# Patient Record
Sex: Female | Born: 1945 | Race: Black or African American | Hispanic: No | Smoking: Former smoker
Health system: Southern US, Community
[De-identification: ages and names within clinical notes are randomized; demographics above are authoritative.]

## PROBLEM LIST (undated history)

## (undated) DIAGNOSIS — M1990 Unspecified osteoarthritis, unspecified site: Secondary | ICD-10-CM

## (undated) DIAGNOSIS — R569 Unspecified convulsions: Secondary | ICD-10-CM

## (undated) DIAGNOSIS — D128 Benign neoplasm of rectum: Secondary | ICD-10-CM

## (undated) DIAGNOSIS — Z87898 Personal history of other specified conditions: Secondary | ICD-10-CM

## (undated) DIAGNOSIS — A09 Infectious gastroenteritis and colitis, unspecified: Secondary | ICD-10-CM

## (undated) DIAGNOSIS — I639 Cerebral infarction, unspecified: Secondary | ICD-10-CM

## (undated) DIAGNOSIS — E559 Vitamin D deficiency, unspecified: Secondary | ICD-10-CM

## (undated) DIAGNOSIS — D129 Benign neoplasm of anus and anal canal: Secondary | ICD-10-CM

## (undated) DIAGNOSIS — I10 Essential (primary) hypertension: Secondary | ICD-10-CM

## (undated) DIAGNOSIS — D849 Immunodeficiency, unspecified: Secondary | ICD-10-CM

## (undated) DIAGNOSIS — M65311 Trigger thumb, right thumb: Secondary | ICD-10-CM

## (undated) DIAGNOSIS — G40209 Localization-related (focal) (partial) symptomatic epilepsy and epileptic syndromes with complex partial seizures, not intractable, without status epilepticus: Secondary | ICD-10-CM

## (undated) DIAGNOSIS — E119 Type 2 diabetes mellitus without complications: Secondary | ICD-10-CM

## (undated) HISTORY — DX: Benign neoplasm of rectum: D12.8

## (undated) HISTORY — DX: Trigger thumb, right thumb: M65.311

## (undated) HISTORY — PX: ABDOMINAL HYSTERECTOMY: SHX81

## (undated) HISTORY — DX: Vitamin D deficiency, unspecified: E55.9

## (undated) HISTORY — PX: BACK SURGERY: SHX140

## (undated) HISTORY — PX: SPINE SURGERY: SHX786

## (undated) HISTORY — PX: OOPHORECTOMY: SHX86

## (undated) HISTORY — DX: Type 2 diabetes mellitus without complications: E11.9

## (undated) HISTORY — DX: Infectious gastroenteritis and colitis, unspecified: A09

## (undated) HISTORY — DX: Benign neoplasm of anus and anal canal: D12.9

## (undated) HISTORY — DX: Personal history of other specified conditions: Z87.898

## (undated) HISTORY — DX: Localization-related (focal) (partial) symptomatic epilepsy and epileptic syndromes with complex partial seizures, not intractable, without status epilepticus: G40.209

---

## 2001-01-09 LAB — HM PAP SMEAR

## 2005-09-14 ENCOUNTER — Emergency Department: Payer: Self-pay | Admitting: Emergency Medicine

## 2005-09-14 ENCOUNTER — Other Ambulatory Visit: Payer: Self-pay

## 2006-09-17 ENCOUNTER — Inpatient Hospital Stay: Payer: Self-pay | Admitting: Internal Medicine

## 2006-09-17 ENCOUNTER — Other Ambulatory Visit: Payer: Self-pay

## 2008-10-20 ENCOUNTER — Ambulatory Visit: Payer: Self-pay

## 2008-11-02 ENCOUNTER — Ambulatory Visit: Payer: Self-pay

## 2009-05-05 ENCOUNTER — Ambulatory Visit: Payer: Self-pay

## 2009-10-26 ENCOUNTER — Ambulatory Visit: Payer: Self-pay

## 2010-11-15 ENCOUNTER — Ambulatory Visit: Payer: Self-pay | Admitting: Family Medicine

## 2011-11-02 ENCOUNTER — Emergency Department: Payer: Self-pay | Admitting: Emergency Medicine

## 2011-11-20 ENCOUNTER — Ambulatory Visit: Payer: Self-pay | Admitting: Family Medicine

## 2012-02-12 LAB — HM COLONOSCOPY

## 2012-11-27 ENCOUNTER — Ambulatory Visit: Payer: Self-pay | Admitting: Pediatrics

## 2013-03-26 ENCOUNTER — Emergency Department: Payer: Self-pay | Admitting: Unknown Physician Specialty

## 2013-03-26 LAB — BASIC METABOLIC PANEL
Chloride: 103 mmol/L (ref 98–107)
Creatinine: 0.91 mg/dL (ref 0.60–1.30)
EGFR (Non-African Amer.): >60
Glucose: 161 mg/dL — ABNORMAL HIGH (ref 65–99)
Potassium: 4 mmol/L (ref 3.5–5.1)
Sodium: 138 mmol/L (ref 136–145)

## 2013-03-26 LAB — CBC
HGB: 13.4 g/dL (ref 12.0–16.0)
MCH: 27.5 pg (ref 26.0–34.0)
MCV: 84 fL (ref 80–100)
Platelet: 300 10*3/uL (ref 150–440)
RBC: 4.87 10*6/uL (ref 3.80–5.20)
RDW: 15.6 % — ABNORMAL HIGH (ref 11.5–14.5)
WBC: 6 10*3/uL (ref 3.6–11.0)

## 2013-12-04 ENCOUNTER — Ambulatory Visit: Payer: Self-pay | Admitting: Pediatrics

## 2014-05-14 LAB — HM DEXA SCAN

## 2015-12-11 ENCOUNTER — Observation Stay
Admission: EM | Admit: 2015-12-11 | Discharge: 2015-12-12 | Disposition: A | Discharge status: Home / Self Care | Payer: Medicare Other | Attending: Internal Medicine | Admitting: Internal Medicine

## 2015-12-11 ENCOUNTER — Emergency Department: Payer: Medicare Other

## 2015-12-11 ENCOUNTER — Encounter: Payer: Self-pay | Admitting: Emergency Medicine

## 2015-12-11 DIAGNOSIS — R112 Nausea with vomiting, unspecified: Secondary | ICD-10-CM | POA: Diagnosis present

## 2015-12-11 DIAGNOSIS — Z8673 Personal history of transient ischemic attack (TIA), and cerebral infarction without residual deficits: Secondary | ICD-10-CM | POA: Diagnosis not present

## 2015-12-11 DIAGNOSIS — Z79899 Other long term (current) drug therapy: Secondary | ICD-10-CM | POA: Diagnosis not present

## 2015-12-11 DIAGNOSIS — G40909 Epilepsy, unspecified, not intractable, without status epilepticus: Secondary | ICD-10-CM | POA: Diagnosis not present

## 2015-12-11 DIAGNOSIS — Z880 Allergy status to penicillin: Secondary | ICD-10-CM | POA: Diagnosis not present

## 2015-12-11 DIAGNOSIS — R42 Dizziness and giddiness: Principal | ICD-10-CM | POA: Insufficient documentation

## 2015-12-11 DIAGNOSIS — E785 Hyperlipidemia, unspecified: Secondary | ICD-10-CM | POA: Insufficient documentation

## 2015-12-11 DIAGNOSIS — I10 Essential (primary) hypertension: Secondary | ICD-10-CM | POA: Diagnosis not present

## 2015-12-11 DIAGNOSIS — Z87891 Personal history of nicotine dependence: Secondary | ICD-10-CM | POA: Insufficient documentation

## 2015-12-11 DIAGNOSIS — Z794 Long term (current) use of insulin: Secondary | ICD-10-CM | POA: Insufficient documentation

## 2015-12-11 DIAGNOSIS — E119 Type 2 diabetes mellitus without complications: Secondary | ICD-10-CM | POA: Diagnosis not present

## 2015-12-11 DIAGNOSIS — R270 Ataxia, unspecified: Secondary | ICD-10-CM | POA: Diagnosis present

## 2015-12-11 HISTORY — DX: Essential (primary) hypertension: I10

## 2015-12-11 HISTORY — DX: Immunodeficiency, unspecified: D84.9

## 2015-12-11 HISTORY — DX: Cerebral infarction, unspecified: I63.9

## 2015-12-11 LAB — VALPROIC ACID LEVEL: Valproic Acid Lvl: 46 ug/mL — ABNORMAL LOW (ref 50.0–100.0)

## 2015-12-11 LAB — BASIC METABOLIC PANEL
Anion gap: 7 (ref 5–15)
BUN: 15 mg/dL (ref 6–20)
CO2: 29 mmol/L (ref 22–32)
CREATININE: 0.65 mg/dL (ref 0.44–1.00)
Calcium: 9.3 mg/dL (ref 8.9–10.3)
Chloride: 102 mmol/L (ref 101–111)
GFR calc Af Amer: >60 mL/min (ref >60)
Glucose, Bld: 269 mg/dL — ABNORMAL HIGH (ref 65–99)
Potassium: 3.7 mmol/L (ref 3.5–5.1)
SODIUM: 138 mmol/L (ref 135–145)

## 2015-12-11 LAB — CBC WITH DIFFERENTIAL/PLATELET
Basophils Absolute: 0 10*3/uL (ref 0–0.1)
Basophils Relative: 0 %
EOS ABS: 0 10*3/uL (ref 0–0.7)
EOS PCT: 0 %
HCT: 40.6 % (ref 35.0–47.0)
Hemoglobin: 13.1 g/dL (ref 12.0–16.0)
LYMPHS ABS: 1.5 10*3/uL (ref 1.0–3.6)
Lymphocytes Relative: 25 %
MCH: 26.2 pg (ref 26.0–34.0)
MCHC: 32.3 g/dL (ref 32.0–36.0)
MCV: 80.9 fL (ref 80.0–100.0)
MONO ABS: 0.4 10*3/uL (ref 0.2–0.9)
MONOS PCT: 7 %
Neutro Abs: 4.2 10*3/uL (ref 1.4–6.5)
Neutrophils Relative %: 68 %
PLATELETS: 256 10*3/uL (ref 150–440)
RBC: 5.02 MIL/uL (ref 3.80–5.20)
RDW: 15.9 % — ABNORMAL HIGH (ref 11.5–14.5)
WBC: 6.1 10*3/uL (ref 3.6–11.0)

## 2015-12-11 LAB — HEMOGLOBIN A1C: Hgb A1c MFr Bld: 11.3 % — ABNORMAL HIGH (ref 4.0–6.0)

## 2015-12-11 LAB — URINALYSIS COMPLETE WITH MICROSCOPIC (ARMC ONLY)
Bacteria, UA: NONE SEEN
Bilirubin Urine: NEGATIVE
Glucose, UA: >500 mg/dL — AB
Hgb urine dipstick: NEGATIVE
Leukocytes, UA: NEGATIVE
Nitrite: NEGATIVE
Protein, ur: NEGATIVE mg/dL
Specific Gravity, Urine: 1.028 (ref 1.005–1.030)
pH: 6 (ref 5.0–8.0)

## 2015-12-11 LAB — GLUCOSE, CAPILLARY
Glucose-Capillary: 296 mg/dL — ABNORMAL HIGH (ref 65–99)
Glucose-Capillary: 312 mg/dL — ABNORMAL HIGH (ref 65–99)
Glucose-Capillary: 153 mg/dL — ABNORMAL HIGH (ref 65–99)

## 2015-12-11 LAB — TROPONIN I

## 2015-12-11 LAB — TSH: TSH: 0.705 u[IU]/mL (ref 0.350–4.500)

## 2015-12-11 MED ORDER — ACETAMINOPHEN 325 MG PO TABS: 650.0000 mg | ORAL_TABLET | Freq: Four times a day (QID) | ORAL | Status: DC | PRN

## 2015-12-11 MED ORDER — INSULIN GLARGINE 100 UNIT/ML ~~LOC~~ SOLN
5.0000 [IU] | Freq: Every day | SUBCUTANEOUS | Status: DC
  Administered 2015-12-11: 5 [IU] via SUBCUTANEOUS
  Filled 2015-12-11 (×2): qty 0.05

## 2015-12-11 MED ORDER — ACETAMINOPHEN 650 MG RE SUPP: 650.0000 mg | Freq: Four times a day (QID) | RECTAL | Status: DC | PRN

## 2015-12-11 MED ORDER — FLUTICASONE PROPIONATE 50 MCG/ACT NA SUSP
2.0000 | Freq: Every day | NASAL | Status: DC
  Administered 2015-12-11 – 2015-12-12 (×2): 2 via NASAL
  Filled 2015-12-11: qty 16

## 2015-12-11 MED ORDER — MORPHINE SULFATE (PF) 2 MG/ML IV SOLN: 2.0000 mg | INTRAVENOUS | Status: DC | PRN

## 2015-12-11 MED ORDER — HYDROCHLOROTHIAZIDE 25 MG PO TABS
25.0000 mg | ORAL_TABLET | Freq: Every day | ORAL | Status: DC
  Administered 2015-12-11 – 2015-12-12 (×2): 25 mg via ORAL
  Filled 2015-12-11 (×2): qty 1

## 2015-12-11 MED ORDER — SODIUM CHLORIDE 0.9 % IJ SOLN
3.0000 mL | Freq: Two times a day (BID) | INTRAMUSCULAR | Status: DC
  Administered 2015-12-11 – 2015-12-12 (×3): 3 mL via INTRAVENOUS

## 2015-12-11 MED ORDER — DIVALPROEX SODIUM 250 MG PO DR TAB
250.0000 mg | DELAYED_RELEASE_TABLET | Freq: Two times a day (BID) | ORAL | Status: DC
  Administered 2015-12-11 – 2015-12-12 (×3): 250 mg via ORAL
  Filled 2015-12-11 (×4): qty 1

## 2015-12-11 MED ORDER — DOCUSATE SODIUM 100 MG PO CAPS
100.0000 mg | ORAL_CAPSULE | Freq: Two times a day (BID) | ORAL | Status: DC
  Administered 2015-12-11: 100 mg via ORAL
  Filled 2015-12-11 (×2): qty 1

## 2015-12-11 MED ORDER — ONDANSETRON HCL 4 MG/2ML IJ SOLN: 4.0000 mg | Freq: Four times a day (QID) | INTRAMUSCULAR | Status: DC | PRN

## 2015-12-11 MED ORDER — HEPARIN SODIUM (PORCINE) 5000 UNIT/ML IJ SOLN
5000.0000 [IU] | Freq: Three times a day (TID) | INTRAMUSCULAR | Status: DC
  Administered 2015-12-11 – 2015-12-12 (×3): 5000 [IU] via SUBCUTANEOUS
  Filled 2015-12-11 (×3): qty 1

## 2015-12-11 MED ORDER — INSULIN ASPART 100 UNIT/ML ~~LOC~~ SOLN
0.0000 [IU] | Freq: Three times a day (TID) | SUBCUTANEOUS | Status: DC
  Administered 2015-12-11: 11 [IU] via SUBCUTANEOUS
  Administered 2015-12-11: 8 [IU] via SUBCUTANEOUS
  Administered 2015-12-12: 09:00:00 3 [IU] via SUBCUTANEOUS
  Filled 2015-12-11: qty 3
  Filled 2015-12-11: qty 8
  Filled 2015-12-11: qty 11

## 2015-12-11 MED ORDER — SODIUM CHLORIDE 0.9 % IV SOLN
INTRAVENOUS | Status: DC
  Administered 2015-12-11 (×2): via INTRAVENOUS

## 2015-12-11 MED ORDER — MECLIZINE HCL 25 MG PO TABS
25.0000 mg | ORAL_TABLET | Freq: Once | ORAL | Status: AC
  Administered 2015-12-11: 25 mg via ORAL
  Filled 2015-12-11: qty 1

## 2015-12-11 MED ORDER — ATORVASTATIN CALCIUM 20 MG PO TABS
40.0000 mg | ORAL_TABLET | ORAL | Status: DC
  Administered 2015-12-12: 09:00:00 40 mg via ORAL
  Filled 2015-12-11: qty 2

## 2015-12-11 MED ORDER — ONDANSETRON HCL 4 MG PO TABS: 4.0000 mg | ORAL_TABLET | Freq: Four times a day (QID) | ORAL | Status: DC | PRN

## 2015-12-11 MED ORDER — QUINAPRIL HCL 10 MG PO TABS
20.0000 mg | ORAL_TABLET | Freq: Every day | ORAL | Status: DC
  Administered 2015-12-11 – 2015-12-12 (×2): 20 mg via ORAL
  Filled 2015-12-11 (×2): qty 2

## 2015-12-11 MED ORDER — MECLIZINE HCL 25 MG PO TABS: 25.0000 mg | ORAL_TABLET | Freq: Three times a day (TID) | ORAL | Status: DC | PRN

## 2015-12-11 MED ORDER — PREDNISONE 5 MG PO TABS
10.0000 mg | ORAL_TABLET | Freq: Every day | ORAL | Status: DC
Start: 2015-12-11 — End: 2015-12-12
  Administered 2015-12-12: 09:00:00 10 mg via ORAL
  Filled 2015-12-11: qty 2

## 2015-12-11 MED ORDER — LAMOTRIGINE 100 MG PO TABS
100.0000 mg | ORAL_TABLET | Freq: Two times a day (BID) | ORAL | Status: DC
  Administered 2015-12-11 – 2015-12-12 (×3): 100 mg via ORAL
  Filled 2015-12-11 (×5): qty 1

## 2015-12-11 NOTE — ED Notes (Addendum)
Pt states she woke up around 0700 this am with dizziness and n,v, states she felt fine when she went to bed last night, equal grip strength, no drift noted, face symmetrical

## 2015-12-11 NOTE — ED Notes (Signed)
Patient states that she is experiencing dizziness that started about 7:30am, dizziness is worse with movement. Patient is also experiencing nausea and vomiting.

## 2015-12-11 NOTE — Progress Notes (Signed)
Assumed pt at 1540-1900. Pt denies pain. Ambulates to the bathroom with 1xassist. Denies dizziness.

## 2015-12-11 NOTE — Care Management Obs Status (Signed)
[facility] NOTIFICATION   Patient Details  Name: LOYS CWIKLA MRN: [id number] Date of Birth: 1946-11-23   Medicare Observation Status Notification Given:  Yes    Ival Bible, RN 12/11/2015, 11:37 AM

## 2015-12-11 NOTE — Plan of Care (Addendum)
Problem: Fluid Volume: Goal: Ability to maintain a balanced intake and output will improve Outcome: Progressing IVF infusing. No syncopal episodes thus far.  Patient calls for assistance.  Meclizine PRN ordered. No needs at this time.

## 2015-12-11 NOTE — ED Provider Notes (Signed)
[facility] Emergency Department Provider Note  ____________________________________________  Time seen: Approximately 9:25 AM  I have reviewed the triage vital signs and the nursing notes.   HISTORY  Chief Complaint Dizziness    HPI Brittany Sherman is a 69 y.o. female with a history of hypertension and an intracranial hemorrhage who is presenting today with dizziness upon waking. She says that she woke up about 7 AM but felt normal when she went to sleep last night at about 11 or 11:30. She denies any pain. Says that she feels like she is unsteady when she walks and leaning to the left. She says the dizziness gets worse with movement. Denies any roaring or pressure in her ears bilaterally or recent illness. Says that she is not supposed to use any aspirin or blood thinners secondary to her bleed in the past.Says that also had 2 episodes of vomiting after becoming very dizzy and needing assistance walking back to her bed from the bathroom this morning. Denies any nausea right now is only minimally dizzy. Denies any feelings that she will pass out or chest pain.   Past Medical History  Diagnosis Date  . Hypertension   . Immune deficiency disorder ([place])   . Stroke [facility])     There are no active problems to display for this patient.   History reviewed. No pertinent past surgical history.  No current outpatient prescriptions on file.  Allergies Penicillins  No family history on file.  Social History Social History  Substance Use Topics  . Smoking status: Former Research scientist (life sciences)  . Smokeless tobacco: None  . Alcohol Use: No    Review of Systems Constitutional: No fever/chills Eyes: No visual changes. ENT: No sore throat. Cardiovascular: Denies chest pain. Respiratory: Denies shortness of breath. Gastrointestinal: No abdominal pain.  No diarrhea.  No constipation. Genitourinary: Negative for dysuria. Musculoskeletal: Negative for back pain. Skin: Negative  for rash. Neurological: Negative for headaches, focal weakness or numbness.  10-point ROS otherwise negative.  ____________________________________________   PHYSICAL EXAM:  VITAL SIGNS: ED Triage Vitals  Enc Vitals Group     BP 12/11/15 0913 164/76 mmHg     Pulse Rate 12/11/15 0913 59     Resp 12/11/15 0913 18     Temp --      Temp src --      SpO2 12/11/15 0913 95 %     Weight 12/11/15 0913 172 lb (78.019 kg)     Height 12/11/15 0913 5\' 4"  (1.626 m)     Head Cir --      Peak Flow --      Pain Score --      Pain Loc --      Pain Edu? --      Excl. in [place]? --     Constitutional: Alert and oriented. Well appearing and in no acute distress. Eyes: Conjunctivae are normal. PERRL. EOMI. no nystagmus. Head: Atraumatic. TMs normal bilaterally. Nose: No congestion/rhinnorhea. Mouth/Throat: Mucous membranes are moist.  Oropharynx non-erythematous. Neck: No stridor.   Cardiovascular: Normal rate, regular rhythm. Grossly normal heart sounds.  Good peripheral circulation. Respiratory: Normal respiratory effort.  No retractions. Lungs CTAB. Gastrointestinal: Soft and nontender. No distention.  No CVA tenderness. Musculoskeletal: No lower extremity tenderness nor edema.  No joint effusions. Neurologic:  Normal speech and language. No gross focal neurologic deficits are appreciated. No ataxia on heel-to-shin testing or finger-nose testing bilaterally.  Skin:  Skin is warm, dry and intact. No rash noted. Psychiatric:  Mood and affect are normal. Speech and behavior are normal.  NIH Stroke Scale   Person Administering Scale: Doran Stabler  Administer stroke scale items in the order listed. Record performance in each category after each subscale exam. Do not go back and change scores. Follow directions provided for each exam technique. Scores should reflect what the patient does, not what the clinician thinks the patient can do. The clinician should record answers while  administering the exam and work quickly. Except where indicated, the patient should not be coached (i.e., repeated requests to patient to make a special effort).   1a  Level of consciousness: 0=alert; keenly responsive  1b. LOC questions:  0=Performs both tasks correctly  1c. LOC commands: 0=Performs both tasks correctly  2.  Best Gaze: 0=normal  3.  Visual: 0=No visual loss  4. Facial Palsy: 0=Normal symmetric movement  5a.  Motor left arm: 0=No drift, limb holds 90 (or 45) degrees for full 10 seconds  5b.  Motor right arm: 0=No drift, limb holds 90 (or 45) degrees for full 10 seconds  6a. motor left leg: 0=No drift, limb holds 90 (or 45) degrees for full 10 seconds  6b  Motor right leg:  0=No drift, limb holds 90 (or 45) degrees for full 10 seconds  7. Limb Ataxia: 0=Absent  8.  Sensory: 0=Normal; no sensory loss  9. Best Language:  0=No aphasia, normal  10. Dysarthria: 0=Normal  11. Extinction and Inattention: 0=No abnormality  12. Distal motor function: 0=Normal   Total:   0   ____________________________________________   LABS (all labs ordered are listed, but only abnormal results are displayed)  Labs Reviewed  CBC WITH DIFFERENTIAL/PLATELET - Abnormal; Notable for the following:    RDW 15.9 (*)    All other components within normal limits  BASIC METABOLIC PANEL - Abnormal; Notable for the following:    Glucose, Bld 269 (*)    All other components within normal limits  VALPROIC ACID LEVEL - Abnormal; Notable for the following:    Valproic Acid Lvl 46 (*)    All other components within normal limits  TROPONIN I  URINALYSIS COMPLETEWITH MICROSCOPIC ([facility] ONLY)   ____________________________________________  EKG  ED ECG REPORT I, Doran Stabler, the attending physician, personally viewed and interpreted this ECG.   Date: 12/11/2015  EKG Time: 918  Rate:61  Rhythm: normal sinus rhythm  Axis: Normal axis  Intervals:Incomplete left bundle with minimal voltage  criteria for LVH.  ST&T Change: T-wave inversion in aVL. Similar appearance to T-wave inversion from EKG of 09/17/2006.  ____________________________________________  RADIOLOGY  No acute finding on the CAT scan of the brain. ____________________________________________   PROCEDURES  ____________________________________________   INITIAL IMPRESSION / ASSESSMENT AND PLAN / ED COURSE  Pertinent labs & imaging results that were available during my care of the patient were reviewed by me and considered in my medical decision making (see chart for details).  ----------------------------------------- 10:49 AM on 12/11/2015 -----------------------------------------  Patient with minimal improvement after meclizine. I got the patient up to walk and she is very unsteady on her feet and I had to catch her one point so she would not fall. She says she still feels very dizzy when walking. Admit to the hospital for further workup. A splint is to the patient as well as her daughter was with her at the bedside. Signed out to Dr. Marcille Blanco. Holding aspirin secondary to the patient's previous bleed and bleeding precautions. No further nausea and vomiting  in the emergency department. ____________________________________________   FINAL CLINICAL IMPRESSION(S) / ED DIAGNOSES  Ataxia and vertigo. Nausea and vomiting.    Orbie Pyo, MD 12/11/15 1050

## 2015-12-11 NOTE — H&P (Addendum)
Brittany Sherman is an 69 y.o. female.   Chief Complaint: Dizziness HPI: The patient presents to the emergency department complaining of dizziness. She states that began this morning when she awoke. She has been feeling fine all week. She denies sick contacts. Also denies fever, nausea or vomiting. She states the only time she has felt this way before was when she has had an ear infection. She denies pain in her years. When gait was tested in the emergency department the patient was very steady on her feet which prompted the emergency department staff to call for admission.  Past Medical History  Diagnosis Date  . Hypertension   . Immune deficiency disorder ([place])   . Stroke [facility])     History reviewed. No pertinent past surgical history.  No family history on file. Social History:  reports that she has quit smoking. She does not have any smokeless tobacco history on file. She reports that she does not drink alcohol. Her drug history is not on file.  Allergies:  Allergies  Allergen Reactions  . Aspirin     Stroke  . Penicillins Rash    Has patient had a PCN reaction causing immediate rash, facial/tongue/throat swelling, SOB or lightheadedness with hypotension: Yes Has patient had a PCN reaction causing severe rash involving mucus membranes or skin necrosis: No Has patient had a PCN reaction that required hospitalization No Has patient had a PCN reaction occurring within the last 10 years: Yes If all of the above answers are "NO", then may proceed with Cephalosporin use.    Medications Prior to Admission  Medication Sig Dispense Refill  . atorvastatin (LIPITOR) 40 MG tablet Take 40 mg by mouth every morning.    . divalproex (DEPAKOTE) 250 MG DR tablet Take 250 mg by mouth 2 (two) times daily.    Marland Kitchen glimepiride (AMARYL) 4 MG tablet Take 4 mg by mouth 2 (two) times daily.    . hydrochlorothiazide (HYDRODIURIL) 25 MG tablet Take 25 mg by mouth daily.    . insulin NPH-regular Human (NOVOLIN  70/30) (70-30) 100 UNIT/ML injection Inject 10 Units into the skin 2 (two) times daily with a meal.    . lamoTRIgine (LAMICTAL) 100 MG tablet Take 100 mg by mouth 2 (two) times daily.    . quinapril (ACCUPRIL) 20 MG tablet Take 20 mg by mouth daily.      Results for orders placed or performed during the hospital encounter of 12/11/15 (from the past 48 hour(s))  CBC with Differential     Status: Abnormal   Collection Time: 12/11/15  9:38 AM  Result Value Ref Range   WBC 6.1 3.6 - 11.0 K/uL   RBC 5.02 3.80 - 5.20 MIL/uL   Hemoglobin 13.1 12.0 - 16.0 g/dL   HCT 40.6 35.0 - 47.0 %   MCV 80.9 80.0 - 100.0 fL   MCH 26.2 26.0 - 34.0 pg   MCHC 32.3 32.0 - 36.0 g/dL   RDW 15.9 (H) 11.5 - 14.5 %   Platelets 256 150 - 440 K/uL   Neutrophils Relative % 68 %   Neutro Abs 4.2 1.4 - 6.5 K/uL   Lymphocytes Relative 25 %   Lymphs Abs 1.5 1.0 - 3.6 K/uL   Monocytes Relative 7 %   Monocytes Absolute 0.4 0.2 - 0.9 K/uL   Eosinophils Relative 0 %   Eosinophils Absolute 0.0 0 - 0.7 K/uL   Basophils Relative 0 %   Basophils Absolute 0.0 0 - 0.1 K/uL  Basic  metabolic panel     Status: Abnormal   Collection Time: 12/11/15  9:38 AM  Result Value Ref Range   Sodium 138 135 - 145 mmol/L   Potassium 3.7 3.5 - 5.1 mmol/L   Chloride 102 101 - 111 mmol/L   CO2 29 22 - 32 mmol/L   Glucose, Bld 269 (H) 65 - 99 mg/dL   BUN 15 6 - 20 mg/dL   Creatinine, Ser 0.65 0.44 - 1.00 mg/dL   Calcium 9.3 8.9 - 10.3 mg/dL   GFR calc non Af Amer >60 >60 mL/min   GFR calc Af Amer >60 >60 mL/min    Comment: (NOTE) The eGFR has been calculated using the CKD EPI equation. This calculation has not been validated in all clinical situations. eGFR's persistently <60 mL/min signify possible Chronic Kidney Disease.    Anion gap 7 5 - 15  Troponin I     Status: None   Collection Time: 12/11/15  9:38 AM  Result Value Ref Range   Troponin I <0.03 <0.031 ng/mL    Comment:        NO INDICATION OF MYOCARDIAL INJURY.    Valproic acid level     Status: Abnormal   Collection Time: 12/11/15  9:38 AM  Result Value Ref Range   Valproic Acid Lvl 46 (L) 50.0 - 100.0 ug/mL   Ct Head Wo Contrast  12/11/2015  CLINICAL DATA:  Woke up this morning with dizziness, nausea and vomiting. EXAM: CT HEAD WITHOUT CONTRAST TECHNIQUE: Contiguous axial images were obtained from the base of the skull through the vertex without intravenous contrast. COMPARISON:  September 17, 2006 FINDINGS: There is no midline shift, hydrocephalus, or mass. No acute hemorrhage or acute transcortical infarct is identified. There is chronic diffuse atrophy. Bony calvarium is intact. The visualized sinuses are clear. IMPRESSION: No focal acute intracranial abnormality identified. Chronic diffuse atrophy. Electronically Signed   By: Abelardo Diesel M.D.   On: 12/11/2015 10:27    Review of Systems  Constitutional: Negative for fever and chills.  HENT: Negative for sore throat and tinnitus.   Eyes: Negative for blurred vision and redness.  Respiratory: Negative for cough and shortness of breath.   Cardiovascular: Negative for chest pain, palpitations, orthopnea and PND.  Gastrointestinal: Negative for nausea, vomiting, abdominal pain and diarrhea.  Genitourinary: Negative for dysuria, urgency and frequency.  Musculoskeletal: Negative for myalgias and joint pain.  Skin: Negative for rash.       No lesions  Neurological: Positive for dizziness. Negative for speech change, focal weakness and weakness.  Endo/Heme/Allergies: Does not bruise/bleed easily.       No temperature intolerance  Psychiatric/Behavioral: Negative for depression and suicidal ideas.    Blood pressure 175/88, pulse 65, temperature 97.5 F (36.4 C), resp. rate 15, height 5' 4"  (1.626 m), weight 78.019 kg (172 lb), SpO2 98 %. Physical Exam  Vitals reviewed. Constitutional: She is oriented to person, place, and time. She appears well-developed and well-nourished. No distress.  HENT:   Head: Normocephalic and atraumatic.  Right Ear: Tympanic membrane is not erythematous and not bulging. A middle ear effusion is present.  Left Ear: Tympanic membrane is retracted. Tympanic membrane is not erythematous.  Mouth/Throat: Oropharynx is clear and moist.  Eyes: Conjunctivae and EOM are normal. Pupils are equal, round, and reactive to light. No scleral icterus.  Neck: Normal range of motion. Neck supple. No JVD present. No tracheal deviation present. No thyromegaly present.  Cardiovascular: Normal rate, regular rhythm and normal heart  sounds.  Exam reveals no gallop and no friction rub.   No murmur heard. Respiratory: Effort normal and breath sounds normal.  GI: Soft. Bowel sounds are normal. She exhibits no distension. There is no tenderness.  Genitourinary:  Deferred  Musculoskeletal: Normal range of motion. She exhibits no edema.  Lymphadenopathy:    She has no cervical adenopathy.  Neurological: She is alert and oriented to person, place, and time. No cranial nerve deficit. She exhibits normal muscle tone.  No nystagmus   Skin: Skin is warm and dry.  Psychiatric: She has a normal mood and affect. Her behavior is normal. Judgment and thought content normal.     Assessment/Plan This is a 69 year old African American female admitted for dizziness and gait instability. 1. Dizziness: Likely secondary to serous otitis effusion; fluid does not appear to be infected. Etiology may be eustachian tube dysfunction or viral. I prescribed Flonase and low-dose steroids. Past medical history is significant for subarachnoid hemorrhage. The patient has no evidence of this on CT scan nor does she have any neurological deficits. Observe patient overnight for improvement in gait. 2. Diabetes mellitus type 2: Adjust basal insulin for hospital diet. Sliding scale insulin at meals. Hold oral hypoglycemics 3. Essential hypertension: Continue quinapril and HCTZ 4. Seizure disorder: Continue Lamictal  and Depakote  5. Hyperlipidemia: Continue statin therapy 6. GI prophylaxis: None as the patient is not critically ill 7. DVT prophylaxis: Subcutaneous heparin The patient is a full code. Time spent on admission orders and patient care approximately 45 minutes  Harrie Foreman 12/11/2015, 12:02 PM

## 2015-12-12 LAB — GLUCOSE, CAPILLARY: Glucose-Capillary: 166 mg/dL — ABNORMAL HIGH (ref 65–99)

## 2015-12-12 MED ORDER — PREDNISONE 10 MG PO TABS: 10.0000 mg | ORAL_TABLET | Freq: Every day | ORAL | Status: DC

## 2015-12-12 MED ORDER — CEFUROXIME AXETIL 500 MG PO TABS
500.0000 mg | ORAL_TABLET | Freq: Two times a day (BID) | ORAL | Status: DC
  Administered 2015-12-12: 500 mg via ORAL
  Filled 2015-12-12: qty 1

## 2015-12-12 MED ORDER — CEFUROXIME AXETIL 500 MG PO TABS: 500.0000 mg | ORAL_TABLET | Freq: Two times a day (BID) | ORAL | Status: DC

## 2015-12-12 MED ORDER — MECLIZINE HCL 25 MG PO TABS: 25.0000 mg | ORAL_TABLET | Freq: Three times a day (TID) | ORAL | Status: DC | PRN

## 2015-12-12 NOTE — Progress Notes (Signed)
MD making rounds. Discharge orders received. Unable to schedule appointment. Telemetry Removed. IV removed. Prescriptions given to patient. Discharge paperwork provided, explained, signed and witnessed. Education handouts provided to patient. No unanswered questions. Escorted via wheelchair by nursing staff. All belongings sent with patient and family.

## 2015-12-12 NOTE — Discharge Summary (Signed)
Brittany Sherman, 69 y.o., DOB 1946/02/28, MRN [id number]. Admission date: 12/11/2015 Discharge Date 12/12/2015 Primary MD Juanell Fairly, MD Admitting Physician Harrie Foreman, MD  Admission Diagnosis  Ataxia [R27.0] Non-intractable vomiting with nausea, vomiting of unspecified type [R11.2]  Discharge Diagnosis   Active Problems:   Dizziness possibly secondary to serous ottis effusion  Intractable nausea and vomiting now resolved Diabetes type 2 Essential hypertension Seizure disorder Hyperlipidemia            Hospital Course  The patient presents to the emergency department complaining of dizziness. She states that began this morning when she awoke. She has been feeling fine all week. She denies sick contacts. Also denies fever, nausea or vomiting. She states the only time she has felt this way before was when she has had an ear infection. She denies pain in her years. When gait was tested in the emergency department the patient was very unsteady steady on her feet which prompted the emergency department staff to call for admission. Patient was noted to have some serous effusion. She was started on prednisone. And given some IV fluids. This morning she is ambulating in her room and wants to go home. No further symptoms and is stable for discharge.             Consults  None  Significant Tests:  See full reports for all details     Ct Head Wo Contrast  12/11/2015  CLINICAL DATA:  Woke up this morning with dizziness, nausea and vomiting. EXAM: CT HEAD WITHOUT CONTRAST TECHNIQUE: Contiguous axial images were obtained from the base of the skull through the vertex without intravenous contrast. COMPARISON:  September 17, 2006 FINDINGS: There is no midline shift, hydrocephalus, or mass. No acute hemorrhage or acute transcortical infarct is identified. There is chronic diffuse atrophy. Bony calvarium is intact. The visualized sinuses are clear. IMPRESSION: No focal acute  intracranial abnormality identified. Chronic diffuse atrophy. Electronically Signed   By: Abelardo Diesel M.D.   On: 12/11/2015 10:27       Today   Subjective:   Oluwadamilola Dealmeida feels well denies any complaints no dizziness  Objective:   Blood pressure 141/57, pulse 85, temperature 98.2 F (36.8 C), temperature source Oral, resp. rate 18, height 5\' 4"  (1.626 m), weight 81.194 kg (179 lb), SpO2 100 %.  .  Intake/Output Summary (Last 24 hours) at 12/12/15 1156 Last data filed at 12/12/15 0930  Gross per 24 hour  Intake   2177 ml  Output   1050 ml  Net   1127 ml    Exam VITAL SIGNS: Blood pressure 141/57, pulse 85, temperature 98.2 F (36.8 C), temperature source Oral, resp. rate 18, height 5\' 4"  (1.626 m), weight 81.194 kg (179 lb), SpO2 100 %.  GENERAL:  69 y.o.-year-old patient lying in the bed with no acute distress.  EYES: Pupils equal, round, reactive to light and accommodation. No scleral icterus. Extraocular muscles intact.  HEENT: Head atraumatic, normocephalic. Oropharynx and nasopharynx clear.  NECK:  Supple, no jugular venous distention. No thyroid enlargement, no tenderness.  LUNGS: Normal breath sounds bilaterally, no wheezing, rales,rhonchi or crepitation. No use of accessory muscles of respiration.  CARDIOVASCULAR: S1, S2 normal. No murmurs, rubs, or gallops.  ABDOMEN: Soft, nontender, nondistended. Bowel sounds present. No organomegaly or mass.  EXTREMITIES: No pedal edema, cyanosis, or clubbing.  NEUROLOGIC: Cranial nerves II through XII are intact. Muscle strength 5/5 in all extremities. Sensation intact. Gait not checked.  PSYCHIATRIC: The patient is  alert and oriented x 3.  SKIN: No obvious rash, lesion, or ulcer.   Data Review     CBC w Diff: Lab Results  Component Value Date   WBC 6.1 12/11/2015   WBC 6.0 03/26/2013   HGB 13.1 12/11/2015   HGB 13.4 03/26/2013   HCT 40.6 12/11/2015   HCT 40.7 03/26/2013   PLT 256 12/11/2015   PLT 300 03/26/2013    LYMPHOPCT 25 12/11/2015   MONOPCT 7 12/11/2015   EOSPCT 0 12/11/2015   BASOPCT 0 12/11/2015   CMP: Lab Results  Component Value Date   NA 138 12/11/2015   NA 138 03/26/2013   K 3.7 12/11/2015   K 4.0 03/26/2013   CL 102 12/11/2015   CL 103 03/26/2013   CO2 29 12/11/2015   CO2 31 03/26/2013   BUN 15 12/11/2015   BUN 13 03/26/2013   CREATININE 0.65 12/11/2015   CREATININE 0.91 03/26/2013  .  Micro Results No results found for this or any previous visit (from the past 240 hour(s)).      Code Status Orders        Start     Ordered   12/11/15 1205  Full code   Continuous     12/11/15 1204          Follow-up Information    Follow up with Juanell Fairly, MD In 7 days.   Specialty:  Pediatrics   Contact information:   [place] [address] [phone]       Discharge Medications     Medication List    TAKE these medications        atorvastatin 40 MG tablet  Commonly known as:  LIPITOR  Take 40 mg by mouth every morning.     cefUROXime 500 MG tablet  Commonly known as:  CEFTIN  Take 1 tablet (500 mg total) by mouth 2 (two) times daily with a meal.     divalproex 250 MG DR tablet  Commonly known as:  DEPAKOTE  Take 250 mg by mouth 2 (two) times daily.     glimepiride 4 MG tablet  Commonly known as:  AMARYL  Take 4 mg by mouth 2 (two) times daily.     hydrochlorothiazide 25 MG tablet  Commonly known as:  HYDRODIURIL  Take 25 mg by mouth daily.     lamoTRIgine 100 MG tablet  Commonly known as:  LAMICTAL  Take 100 mg by mouth 2 (two) times daily.     meclizine 25 MG tablet  Commonly known as:  ANTIVERT  Take 1 tablet (25 mg total) by mouth 3 (three) times daily as needed for dizziness.     NOVOLIN 70/30 (70-30) 100 UNIT/ML injection  Generic drug:  insulin NPH-regular Human  Inject 10 Units into the skin 2 (two) times daily with a meal.     predniSONE 10 MG tablet  Commonly known as:  DELTASONE  Take 1  tablet (10 mg total) by mouth daily with breakfast.     quinapril 20 MG tablet  Commonly known as:  ACCUPRIL  Take 20 mg by mouth daily.           Total Time in preparing paper work, data evaluation and todays exam - 35 minutes  Dustin Flock M.D on 12/12/2015 at 11:56 AM  [facility]   Office  [phone]

## 2015-12-12 NOTE — Plan of Care (Signed)
Problem: Education: Goal: Knowledge of [facility] Education information/materials will improve Outcome: Progressing Plan of care for shift provided to patient and family.  Education provided about medications.  Problem: Fluid Volume: Goal: Ability to maintain a balanced intake and output will improve Outcome: Progressing IVF infusing throughout shift.  Patient without nausea and vomiting this shift.  No syncopal episodes.    Problem: Safety: Outcome: Progressing Patient steady on feet and up independently in room.  Call bell and phone within reach.  Patient calls for assistance.

## 2015-12-12 NOTE — Discharge Instructions (Signed)
°  DIET:  °Cardiac diet ° °DISCHARGE CONDITION:  °Good ° °ACTIVITY:  °Activity as tolerated ° °OXYGEN:  °Home Oxygen: No. °  °Oxygen Delivery: room air ° °DISCHARGE LOCATION:  °home  ° ° °ADDITIONAL DISCHARGE INSTRUCTION: ° ° °If you experience worsening of your admission symptoms, develop shortness of breath, life threatening emergency, suicidal or homicidal thoughts you must seek medical attention immediately by calling 911 or calling your MD immediately  if symptoms less severe. ° °You Must read complete instructions/literature along with all the possible adverse reactions/side effects for all the Medicines you take and that have been prescribed to you. Take any new Medicines after you have completely understood and accpet all the possible adverse reactions/side effects.  ° °Please note ° °You were cared for by a hospitalist during your hospital stay. If you have any questions about your discharge medications or the care you received while you were in the hospital after you are discharged, you can call the unit and asked to speak with the hospitalist on call if the hospitalist that took care of you is not available. Once you are discharged, your primary care physician will handle any further medical issues. Please note that NO REFILLS for any discharge medications will be authorized once you are discharged, as it is imperative that you return to your primary care physician (or establish a relationship with a primary care physician if you do not have one) for your aftercare needs so that they can reassess your need for medications and monitor your lab values. ° ° °

## 2015-12-12 NOTE — Progress Notes (Signed)
MD order received in [facility] to discharge pt home today; verbally reviewed AVS with pt including medications/gave Rxs to pt for Ceftin, Prednisone and Meclizine; diet/cardiac diet; activity level/activity as tolerated and follow up appointment/pt to call on 12/13/15 to schedule appointment with Dr Petra Kuba for 7 days; pt verbalized understanding with no questions; pt discharged via wheelchair by nursing to the visitor's entrance

## 2015-12-14 MED FILL — Quinapril HCl Tab 10 MG: ORAL | Qty: 2 | Status: AC

## 2015-12-14 MED FILL — Quinapril HCl Tab 10 MG: ORAL | Qty: 2 | Status: CN

## 2015-12-19 HISTORY — PX: TOTAL HIP ARTHROPLASTY: SHX124

## 2016-03-06 LAB — HM MAMMOGRAPHY: HM MAMMO: NORMAL (ref 0–4)

## 2016-07-12 ENCOUNTER — Encounter: Payer: Self-pay | Admitting: Emergency Medicine

## 2016-07-12 ENCOUNTER — Emergency Department: Payer: Medicare Other

## 2016-07-12 ENCOUNTER — Emergency Department
Admission: EM | Admit: 2016-07-12 | Discharge: 2016-07-12 | Disposition: A | Discharge status: Home / Self Care | Payer: Medicare Other | Attending: Emergency Medicine | Admitting: Emergency Medicine

## 2016-07-12 DIAGNOSIS — Z87891 Personal history of nicotine dependence: Secondary | ICD-10-CM | POA: Insufficient documentation

## 2016-07-12 DIAGNOSIS — Z7984 Long term (current) use of oral hypoglycemic drugs: Secondary | ICD-10-CM | POA: Insufficient documentation

## 2016-07-12 DIAGNOSIS — Z794 Long term (current) use of insulin: Secondary | ICD-10-CM | POA: Insufficient documentation

## 2016-07-12 DIAGNOSIS — Z8673 Personal history of transient ischemic attack (TIA), and cerebral infarction without residual deficits: Secondary | ICD-10-CM | POA: Insufficient documentation

## 2016-07-12 DIAGNOSIS — M79662 Pain in left lower leg: Secondary | ICD-10-CM | POA: Diagnosis present

## 2016-07-12 DIAGNOSIS — M7122 Synovial cyst of popliteal space [Baker], left knee: Secondary | ICD-10-CM | POA: Insufficient documentation

## 2016-07-12 DIAGNOSIS — I10 Essential (primary) hypertension: Secondary | ICD-10-CM | POA: Insufficient documentation

## 2016-07-12 NOTE — Discharge Instructions (Signed)
As we discussed, it does not appear that you have an emergent medical condition at this time.  We believe that your pain is most likely the result of the Baker's cyst behind her left knee.  Please read through the included information for management recommendations and follow-up with your orthopedic surgeon at the next available opportunity.  Use your walker for increased stability.  Return to the emergency department if you develop new or worsening symptoms that concern you.

## 2016-07-12 NOTE — ED Triage Notes (Signed)
Left calf swelling x1 wk

## 2016-07-12 NOTE — ED Provider Notes (Signed)
[facility] Emergency Department Provider Note  ____________________________________________   First MD Initiated Contact with Patient 07/12/16 1410     I have reviewed the triage vital signs and the nursing notes.   HISTORY  Chief Complaint Leg Pain    HPI Brittany Sherman is a 70 y.o. female with a history of a prior left hip replacement about [facility] "many" years ago who presents with gradual onset but worsening left knee and lower leg pain.  She reports that this is been steadily worsening over the last couple of weeks although it is most notable over the last few days.  She describes as an aching pain that starts from behind her knee and radiates down her calf.  She states that sometimes she has some mild swelling in her calf as well.  She has no history of blood clots in the legs of the lungs.  She has not sustained any trauma of which she is aware.  She denies fever/chills, chest pain, shortness of breath, nausea, vomiting, diarrhea, abdominal pain, dysuria.  She has not seen her orthopedic surgeon for some time.  She ambulates with a limp but she also has a walker at home, she just prefers not to use it.  She has not had any recent falls. Movement makes the pain a little bit worse, nothing in particular makes it better.   Past Medical History:  Diagnosis Date  . Hypertension   . Immune deficiency disorder ([place])   . Stroke [facility])     Patient Active Problem List   Diagnosis Date Noted  . Dizziness 12/11/2015    History reviewed. No pertinent surgical history.  Prior to Admission medications   Medication Sig Start Date End Date Taking? Authorizing Provider  atorvastatin (LIPITOR) 40 MG tablet Take 40 mg by mouth every morning. 11/17/15   Historical Provider, MD  cefUROXime (CEFTIN) 500 MG tablet Take 1 tablet (500 mg total) by mouth 2 (two) times daily with a meal. 12/12/15   Dustin Flock, MD  divalproex (DEPAKOTE) 250 MG DR tablet Take 250 mg by  mouth 2 (two) times daily. 09/16/15   Historical Provider, MD  glimepiride (AMARYL) 4 MG tablet Take 4 mg by mouth 2 (two) times daily. 09/16/15   Historical Provider, MD  hydrochlorothiazide (HYDRODIURIL) 25 MG tablet Take 25 mg by mouth daily. 10/25/15   Historical Provider, MD  insulin NPH-regular Human (NOVOLIN 70/30) (70-30) 100 UNIT/ML injection Inject 10 Units into the skin 2 (two) times daily with a meal.    Historical Provider, MD  lamoTRIgine (LAMICTAL) 100 MG tablet Take 100 mg by mouth 2 (two) times daily. 09/23/15   Historical Provider, MD  meclizine (ANTIVERT) 25 MG tablet Take 1 tablet (25 mg total) by mouth 3 (three) times daily as needed for dizziness. 12/12/15   Dustin Flock, MD  predniSONE (DELTASONE) 10 MG tablet Take 1 tablet (10 mg total) by mouth daily with breakfast. 12/12/15   Dustin Flock, MD  quinapril (ACCUPRIL) 20 MG tablet Take 20 mg by mouth daily. 11/02/15   Historical Provider, MD    Allergies Aspirin and Penicillins  No family history on file.  Social History Social History  Substance Use Topics  . Smoking status: Former Research scientist (life sciences)  . Smokeless tobacco: Never Used  . Alcohol use No    Review of Systems Constitutional: No fever/chills Eyes: No visual changes. ENT: No sore throat. Cardiovascular: Denies chest pain. Respiratory: Denies shortness of breath. Gastrointestinal: No abdominal pain.  No nausea,  no vomiting.  No diarrhea.  No constipation. Genitourinary: Negative for dysuria. Musculoskeletal: Pain behind the left knee radiating down her leg Skin: Negative for rash. Neurological: Negative for headaches, focal weakness or numbness.  10-point ROS otherwise negative.  ____________________________________________   PHYSICAL EXAM:  VITAL SIGNS: ED Triage Vitals  Enc Vitals Group     BP 07/12/16 1138 (!) 158/70     Pulse Rate 07/12/16 1138 98     Resp 07/12/16 1138 18     Temp 07/12/16 1138 98.1 F (36.7 C)     Temp Source 07/12/16 1138  Oral     SpO2 07/12/16 1138 98 %     Weight 07/12/16 1138 185 lb (83.9 kg)     Height 07/12/16 1138 5\' 5"  (1.651 m)     Head Circumference --      Peak Flow --      Pain Score 07/12/16 1136 5     Pain Loc --      Pain Edu? --      Excl. in [place]? --     Constitutional: Alert and oriented. Well appearing and in no acute distress. Eyes: Conjunctivae are normal. PERRL. EOMI. Head: Atraumatic. Nose: No congestion/rhinnorhea. Mouth/Throat: Mucous membranes are moist.  Oropharynx non-erythematous. Neck: No stridor.  No meningeal signs.   Cardiovascular: Normal rate, regular rhythm. Good peripheral circulation. Grossly normal heart sounds.   Respiratory: Normal respiratory effort.  No retractions. Lungs CTAB. Gastrointestinal: Soft and nontender. No distention.  Musculoskeletal: Soft and nontender mass in the popliteal fossa, no swelling or firm nodules in the vascular distribution.  Normal capillary refill bilaterally, no significant edema in either lower extremity.  The rest of the extremities are unremarkable. Neurologic:  Normal speech and language. No gross focal neurologic deficits are appreciated.  Skin:  Skin is warm, dry and intact. No rash noted. Psychiatric: Mood and affect are normal. Speech and behavior are normal.  ____________________________________________   LABS (all labs ordered are listed, but only abnormal results are displayed)  Labs Reviewed - No data to display ____________________________________________  EKG  None ____________________________________________  RADIOLOGY   US Venous Img Lower Unilateral Left  Result Date: 07/12/2016 CLINICAL DATA:  Acute calf pain and swelling for 5 days. EXAM: LEFT LOWER EXTREMITY VENOUS DOPPLER ULTRASOUND TECHNIQUE: Gray-scale sonography with graded compression, as well as color Doppler and duplex ultrasound were performed to evaluate the lower extremity deep venous systems from the level of the common femoral vein and  including the common femoral, femoral, profunda femoral, popliteal and calf veins including the posterior tibial, peroneal and gastrocnemius veins when visible. The superficial great saphenous vein was also interrogated. Spectral Doppler was utilized to evaluate flow at rest and with distal augmentation maneuvers in the common femoral, femoral and popliteal veins. COMPARISON:  None. FINDINGS: Contralateral Common Femoral Vein: Respiratory phasicity is normal and symmetric with the symptomatic side. No evidence of thrombus. Normal compressibility. Common Femoral Vein: No evidence of thrombus. Normal compressibility, respiratory phasicity and response to augmentation. Saphenofemoral Junction: No evidence of thrombus. Normal compressibility and flow on color Doppler imaging. Profunda Femoral Vein: No evidence of thrombus. Normal compressibility and flow on color Doppler imaging. Femoral Vein: No evidence of thrombus. Normal compressibility, respiratory phasicity and response to augmentation. Popliteal Vein: No evidence of thrombus. Normal compressibility, respiratory phasicity and response to augmentation. Calf Veins: No evidence of thrombus. Normal compressibility and flow on color Doppler imaging. Superficial Great Saphenous Vein: No evidence of thrombus. Normal compressibility and flow on color  Doppler imaging. Venous Reflux:  None. Other Findings: Complex irregular and septated popliteal fossa cyst measures 6 x 2 x 1.7 cm. This is compatible with a chronic Bakers cyst. IMPRESSION: Negative for left lower extremity acute DVT. Chronic complex left popliteal Baker's cyst. Electronically Signed   By: Jerilynn Mages.  Shick M.D.   On: 07/12/2016 13:30   ____________________________________________   PROCEDURES  Procedure(s) performed:   Procedures   ____________________________________________   INITIAL IMPRESSION / ASSESSMENT AND PLAN / ED COURSE  Pertinent labs & imaging results that were available during my care  of the patient were reviewed by me and considered in my medical decision making (see chart for details).  The patient is well-appearing and in no acute distress.  She is able to ambulate with a bit of a limp.  Her ultrasound is consistent with a Baker's cyst which I suspect is been present for a while and is now causing her more issues.  I suspect she has some chronic arthritis is also contributing to her discomfort.  She does not appear to have an acute emergent medical condition at this time and I believe she is appropriate for outpatient follow-up with her orthopedic surgeon.  I asked if she wanted information about a local orthopedic doctor but she would prefer to go back to [facility].  I gave her my usual and customary discussion about Baker's cyst and my usual return precautions.  She understands and agrees with the plan.  Clinical Course    ____________________________________________  FINAL CLINICAL IMPRESSION(S) / ED DIAGNOSES  Final diagnoses:  Unruptured cyst of left popliteal space  Pain in left lower leg     MEDICATIONS GIVEN DURING THIS VISIT:  Medications - No data to display   NEW OUTPATIENT MEDICATIONS STARTED DURING THIS VISIT:  Discharge Medication List as of 07/12/2016  2:29 PM        Note:  This document was prepared using Dragon voice recognition software and may include unintentional dictation errors.    Hinda Kehr, MD 07/12/16 [phone]

## 2016-11-02 LAB — LIPID PANEL
Cholesterol: 128 mg/dL (ref 0–200)
HDL: 44 mg/dL (ref 35–70)
LDL CALC: 55 mg/dL
Triglycerides: 144 mg/dL (ref 40–160)

## 2016-11-02 LAB — BASIC METABOLIC PANEL
BUN: 13 mg/dL (ref 4–21)
Creatinine: 1 mg/dL (ref 0.5–1.1)
GLUCOSE: 220 mg/dL
Potassium: 4.3 mmol/L (ref 3.4–5.3)
Sodium: 141 mmol/L (ref 137–147)

## 2016-11-02 LAB — MICROALBUMIN, URINE: Microalb, Ur: 4.6

## 2016-11-02 LAB — TSH: TSH: 1.68 u[IU]/mL (ref 0.41–5.90)

## 2016-11-02 LAB — HEPATIC FUNCTION PANEL
ALT: 81 U/L — AB (ref 7–35)
AST: 18 U/L (ref 13–35)

## 2016-12-27 LAB — HM DIABETES EYE EXAM

## 2017-01-01 DIAGNOSIS — G40209 Localization-related (focal) (partial) symptomatic epilepsy and epileptic syndromes with complex partial seizures, not intractable, without status epilepticus: Secondary | ICD-10-CM

## 2017-01-01 DIAGNOSIS — Z8673 Personal history of transient ischemic attack (TIA), and cerebral infarction without residual deficits: Secondary | ICD-10-CM | POA: Insufficient documentation

## 2017-01-01 DIAGNOSIS — I10 Essential (primary) hypertension: Secondary | ICD-10-CM

## 2017-01-01 DIAGNOSIS — IMO0002 Reserved for concepts with insufficient information to code with codable children: Secondary | ICD-10-CM | POA: Insufficient documentation

## 2017-01-01 DIAGNOSIS — M65311 Trigger thumb, right thumb: Secondary | ICD-10-CM

## 2017-01-01 DIAGNOSIS — I129 Hypertensive chronic kidney disease with stage 1 through stage 4 chronic kidney disease, or unspecified chronic kidney disease: Secondary | ICD-10-CM | POA: Insufficient documentation

## 2017-01-01 DIAGNOSIS — E1165 Type 2 diabetes mellitus with hyperglycemia: Secondary | ICD-10-CM

## 2017-01-01 DIAGNOSIS — E119 Type 2 diabetes mellitus without complications: Secondary | ICD-10-CM

## 2017-01-01 DIAGNOSIS — E559 Vitamin D deficiency, unspecified: Secondary | ICD-10-CM | POA: Insufficient documentation

## 2017-01-01 DIAGNOSIS — E1140 Type 2 diabetes mellitus with diabetic neuropathy, unspecified: Secondary | ICD-10-CM | POA: Insufficient documentation

## 2017-01-01 DIAGNOSIS — D126 Benign neoplasm of colon, unspecified: Secondary | ICD-10-CM | POA: Insufficient documentation

## 2017-02-01 LAB — HEMOGLOBIN A1C: HEMOGLOBIN A1C: 11

## 2017-02-27 ENCOUNTER — Ambulatory Visit (INDEPENDENT_AMBULATORY_CARE_PROVIDER_SITE_OTHER): Payer: Medicare Other | Admitting: Family Medicine

## 2017-02-27 ENCOUNTER — Encounter: Payer: Self-pay | Admitting: Family Medicine

## 2017-02-27 VITALS — BP 148/68 | HR 71 | Ht 65.0 in | Wt 191.6 lb

## 2017-02-27 DIAGNOSIS — N183 Chronic kidney disease, stage 3 unspecified: Secondary | ICD-10-CM | POA: Insufficient documentation

## 2017-02-27 DIAGNOSIS — E1165 Type 2 diabetes mellitus with hyperglycemia: Secondary | ICD-10-CM

## 2017-02-27 DIAGNOSIS — E785 Hyperlipidemia, unspecified: Secondary | ICD-10-CM

## 2017-02-27 DIAGNOSIS — Z8673 Personal history of transient ischemic attack (TIA), and cerebral infarction without residual deficits: Secondary | ICD-10-CM

## 2017-02-27 DIAGNOSIS — E1140 Type 2 diabetes mellitus with diabetic neuropathy, unspecified: Secondary | ICD-10-CM

## 2017-02-27 DIAGNOSIS — G40209 Localization-related (focal) (partial) symptomatic epilepsy and epileptic syndromes with complex partial seizures, not intractable, without status epilepticus: Secondary | ICD-10-CM | POA: Diagnosis not present

## 2017-02-27 DIAGNOSIS — I10 Essential (primary) hypertension: Secondary | ICD-10-CM | POA: Diagnosis not present

## 2017-02-27 DIAGNOSIS — D126 Benign neoplasm of colon, unspecified: Secondary | ICD-10-CM | POA: Diagnosis not present

## 2017-02-27 DIAGNOSIS — M159 Polyosteoarthritis, unspecified: Secondary | ICD-10-CM

## 2017-02-27 DIAGNOSIS — M150 Primary generalized (osteo)arthritis: Secondary | ICD-10-CM

## 2017-02-27 DIAGNOSIS — N644 Mastodynia: Secondary | ICD-10-CM | POA: Diagnosis not present

## 2017-02-27 DIAGNOSIS — M1990 Unspecified osteoarthritis, unspecified site: Secondary | ICD-10-CM | POA: Insufficient documentation

## 2017-02-27 DIAGNOSIS — E1169 Type 2 diabetes mellitus with other specified complication: Secondary | ICD-10-CM | POA: Diagnosis not present

## 2017-02-27 DIAGNOSIS — Z1211 Encounter for screening for malignant neoplasm of colon: Secondary | ICD-10-CM | POA: Diagnosis not present

## 2017-02-27 DIAGNOSIS — IMO0002 Reserved for concepts with insufficient information to code with codable children: Secondary | ICD-10-CM

## 2017-02-27 MED ORDER — DICLOFENAC SODIUM 1 % TD GEL: 4.0000 g | Freq: Four times a day (QID) | TRANSDERMAL | 3 refills | Status: DC

## 2017-02-27 MED ORDER — METFORMIN HCL ER 500 MG PO TB24: ORAL_TABLET | ORAL | 1 refills | Status: DC

## 2017-02-27 NOTE — Assessment & Plan Note (Signed)
On ACE-inhibitor for renal protection. Will get BP and DM under better control.

## 2017-02-27 NOTE — Assessment & Plan Note (Signed)
Under good control on last check. Continue atorvastatin. Continue to monitor. Call with any concerns.

## 2017-02-27 NOTE — Assessment & Plan Note (Signed)
Will work on [employer]. Recheck 1 month. Continue current regimen.

## 2017-02-27 NOTE — Assessment & Plan Note (Signed)
Will get her started on voltaren due to CKD on last blood work and HTN. Order sent to her pharmacy.

## 2017-02-27 NOTE — Assessment & Plan Note (Signed)
Uncontrolled. A1c 11.0 at last visit to last PCP. Had GI upset on metformin. Unclear if she to long acting or short acting. Will start her on long acting metformin and recheck in 1 month. Will obtain records from eye doctor- form faxed today. Continue to monitor.

## 2017-02-27 NOTE — Progress Notes (Signed)
BP (!) 148/68   Pulse 71   Ht 5\' 5"  (1.651 m)   Wt 191 lb 9.6 oz (86.9 kg)   SpO2 97%   BMI 31.88 kg/m    Subjective:    Patient ID: Brittany Sherman, female    DOB: Dec 04, 1946, 71 y.o.   MRN: [id number]  HPI: Brittany Sherman is a 71 y.o. female  Chief Complaint  Patient presents with  . New Patient (Initial Visit)  . Shoulder Pain    Left, possible bursititis  . Knee Pain    Left, fluid on it  . Foot Pain    Left, diabetic   Has been having pain in her L side. She notes that her L shoulder and her L knee have been hurting.   SHOULDER PAIN Duration: Since before Christmas Involved shoulder: left Mechanism of injury: unknown Location: diffuse Onset:gradual Severity: severe  Quality:  aching Frequency: constant Radiation: yes - into her neck Aggravating factors: movement  Alleviating factors: laying down  Status: worse Treatments attempted: creams, APAP and ibuprofen  Relief with NSAIDs?:  moderate Weakness: no Numbness: no Decreased grip strength: no Redness: no Swelling: no Bruising: no Fevers: no  KNEE PAIN- had a hip replacement, went to see ortho and they didn't want to do a steroid injection due to diabetes Duration: Last summer Involved knee: left Mechanism of injury: unknown Location:diffuse Onset: gradual Severity: severe  Quality:  Aching  Frequency: intermittent Radiation: no Aggravating factors: walking  Alleviating factors: APAP and NSAIDs  Status: worse Treatments attempted: APAP and ibuprofen  Relief with NSAIDs?:  moderate Weakness with weight bearing or walking: no Sensation of giving way: no Locking: yes Popping: no Bruising: no Swelling: no Redness: no Paresthesias/decreased sensation: no Fevers: no  DIABETES- she states that metformin tore up her stomach Hypoglycemic episodes:no Polydipsia/polyuria: no Visual disturbance: no Chest pain: no Paresthesias: yes Glucose Monitoring: yes  Accucheck frequency: 2-3x a  week  Fasting glucose: 150+ Taking Insulin?: yes 70/30 BID Blood Pressure Monitoring: not checking Retinal Examination: Up to Date Foot Exam: Up to Date Diabetic Education: Not Completed Pneumovax: Up to Date Influenza: Up to Date Aspirin: yes  HYPERTENSION / HYPERLIPIDEMIA Satisfied with current treatment? yes Duration of hypertension: chronic BP monitoring frequency: not checking BP range:  BP medication side effects: no Past BP meds: quinipril, HCTZ Duration of hyperlipidemia: chronic Cholesterol medication side effects: no Cholesterol supplements: none Past cholesterol medications: atorvastatin Medication compliance: good compliance Aspirin: no Recent stressors: no Recurrent headaches: no Visual changes: no Palpitations: no Dyspnea: no Chest pain: no Lower extremity edema: no Dizzy/lightheaded: no  BREAST PAIN Duration :weeks Location: left Onset: gradual Severity: moderate Quality: aching and sore Frequency: constant Redness: no Swelling: no Trauma: no trauma Breastfeeding: no Associated with menstral cycle: no Nipple discharge: no Breast lump: no Status: stable Treatments attempted: none Previous mammogram: yes- at [facility]   Active Ambulatory Problems    Diagnosis Date Noted  . Hypertension   . Uncontrolled type 2 diabetes with neuropathy ([place])   . History of stroke without residual deficits   . Vitamin D deficiency   . Trigger finger of right thumb   . Benign neoplasm of colon   . Partial epilepsy with impairment of consciousness ([place])   . Osteoarthritis 02/27/2017  . CKD (chronic kidney disease), stage III 02/27/2017  . Hyperlipidemia associated with type 2 diabetes mellitus ([place]) 02/27/2017   Resolved Ambulatory Problems    Diagnosis Date Noted  . Dizziness 12/11/2015   Past  Medical History:  Diagnosis Date  . Benign neoplasm of colon   . Benign neoplasm of rectum and anal canal   . Diabetes mellitus without complication ([place])   . History  of vertigo   . Hypertension   . Immune deficiency disorder ([place])   . Infectious colitis, enteritis and gastroenteritis   . Partial epilepsy with impairment of consciousness ([place])   . Stroke ([place])   . Trigger finger of right thumb   . Vitamin D deficiency    Past Surgical History:  Procedure Laterality Date  . ABDOMINAL HYSTERECTOMY    . OOPHORECTOMY    . SPINE SURGERY    . TOTAL HIP ARTHROPLASTY  2017   Outpatient Encounter Prescriptions as of 02/27/2017  Medication Sig  . atorvastatin (LIPITOR) 40 MG tablet Take 40 mg by mouth every morning.  . divalproex (DEPAKOTE) 250 MG DR tablet Take 250 mg by mouth 2 (two) times daily.  Marland Kitchen glimepiride (AMARYL) 4 MG tablet Take 4 mg by mouth 2 (two) times daily.  . hydrochlorothiazide (HYDRODIURIL) 25 MG tablet Take 25 mg by mouth daily.  . insulin NPH-regular Human (NOVOLIN 70/30) (70-30) 100 UNIT/ML injection Inject 10 Units into the skin 2 (two) times daily with a meal.  . lamoTRIgine (LAMICTAL) 100 MG tablet Take 100 mg by mouth 2 (two) times daily.  . quinapril (ACCUPRIL) 20 MG tablet Take 20 mg by mouth daily.  . diclofenac sodium (VOLTAREN) 1 % GEL Apply 4 g topically 4 (four) times daily.  . metFORMIN (GLUCOPHAGE XR) 500 MG 24 hr tablet 1 tab 2x a day for 1 week, then 2 tabs 2x a day after that  . [DISCONTINUED] cefUROXime (CEFTIN) 500 MG tablet Take 1 tablet (500 mg total) by mouth 2 (two) times daily with a meal.  . [DISCONTINUED] meclizine (ANTIVERT) 25 MG tablet Take 1 tablet (25 mg total) by mouth 3 (three) times daily as needed for dizziness.  . [DISCONTINUED] predniSONE (DELTASONE) 10 MG tablet Take 1 tablet (10 mg total) by mouth daily with breakfast.   No facility-administered encounter medications on file as of 02/27/2017.    Allergies  Allergen Reactions  . Aspirin     Stroke  . Penicillins Rash    Has patient had a PCN reaction causing immediate rash, facial/tongue/throat swelling, SOB or lightheadedness with hypotension:  Yes Has patient had a PCN reaction causing severe rash involving mucus membranes or skin necrosis: No Has patient had a PCN reaction that required hospitalization No Has patient had a PCN reaction occurring within the last 10 years: Yes If all of the above answers are "NO", then may proceed with Cephalosporin use.   Social History   Social History  . Marital status: Married    Spouse name: N/A  . Number of children: N/A  . Years of education: N/A   Social History Main Topics  . Smoking status: Former Research scientist (life sciences)  . Smokeless tobacco: Never Used  . Alcohol use No  . Drug use: Unknown  . Sexual activity: Not Asked   Other Topics Concern  . None   Social History Narrative  . None   Family History  Problem Relation Age of Onset  . Cancer Mother   . Cancer Sister     Review of Systems  Constitutional: Negative.   Respiratory: Negative.   Cardiovascular: Negative.   Musculoskeletal: Positive for arthralgias and myalgias. Negative for back pain, gait problem, joint swelling, neck pain and neck stiffness.  Skin: Negative.   Neurological: Positive  for tremors, seizures and numbness. Negative for dizziness, syncope, facial asymmetry, speech difficulty, weakness, light-headedness and headaches.  Psychiatric/Behavioral: Negative.     Per HPI unless specifically indicated above     Objective:    BP (!) 148/68   Pulse 71   Ht 5\' 5"  (1.651 m)   Wt 191 lb 9.6 oz (86.9 kg)   SpO2 97%   BMI 31.88 kg/m   Wt Readings from Last 3 Encounters:  02/27/17 191 lb 9.6 oz (86.9 kg)  07/12/16 185 lb (83.9 kg)  12/12/15 179 lb (81.2 kg)    Physical Exam  Constitutional: She is oriented to person, place, and time. She appears well-developed and well-nourished. No distress.  HENT:  Head: Normocephalic and atraumatic.  Right Ear: Hearing normal.  Left Ear: Hearing normal.  Nose: Nose normal.  Eyes: Conjunctivae and lids are normal. Right eye exhibits no discharge. Left eye exhibits no  discharge. No scleral icterus.  Cardiovascular: Normal rate, regular rhythm, normal heart sounds and intact distal pulses.  Exam reveals no gallop and no friction rub.   No murmur heard. Pulmonary/Chest: Effort normal and breath sounds normal. No respiratory distress. She has no wheezes. She has no rales. She exhibits no tenderness.  Musculoskeletal: She exhibits tenderness. She exhibits no edema or deformity.  + crepitus and pain along joint line of L knee, Negative anterior and posterior drawer, negative mcmurray's, negative appley's compression and distraction   Neurological: She is alert and oriented to person, place, and time.  Skin: Skin is warm, dry and intact. No rash noted. No erythema. No pallor.  Psychiatric: She has a normal mood and affect. Her speech is normal and behavior is normal. Judgment and thought content normal. Cognition and memory are normal.  Nursing note and vitals reviewed.    Shoulder: left    Inspection:  no swelling, ecchymosis, erythema or step off deformity.  Tenderness to Palpation:    Acromion: yes    AC joint:yes    Clavicle: yes    Bicipital groove: no    Scapular spine: no    Coracoid process: no    Humeral head: yes    Supraspinatus tendon: yes     Range of Motion:     Abduction:Decreased    Adduction: Decreased    Flexion: Decreased    Extension: Decreased    Internal rotation: Decreased    External rotation: Decreased    Painful arc: yes     Muscle Strength: 5/5 bilaterally     Neuro: Sensation WNL. and Upper extremity reflexes WNL.   Results for orders placed or performed in visit on 02/27/17  Hemoglobin A1c  Result Value Ref Range   Hemoglobin A1C 11.0   Microalbumin, urine  Result Value Ref Range   Microalb, Ur 4.6   Basic metabolic panel  Result Value Ref Range   Glucose 220 mg/dL   BUN 13 4 - 21 mg/dL   Creatinine 1.0 0.5 - 1.1 mg/dL   Potassium 4.3 3.4 - 5.3 mmol/L   Sodium 141 137 - 147 mmol/L  Lipid panel  Result  Value Ref Range   Triglycerides 144 40 - 160 mg/dL   Cholesterol 128 0 - 200 mg/dL   HDL 44 35 - 70 mg/dL   LDL Cholesterol 55 mg/dL  Hepatic function panel  Result Value Ref Range   ALT 81 (A) 7 - 35 U/L   AST 18 13 - 35 U/L  TSH  Result Value Ref Range   TSH 1.68  0.41 - 5.90 uIU/mL      Assessment & Plan:   Problem List Items Addressed This Visit      Cardiovascular and Mediastinum   Hypertension - Primary    Will work on [employer]. Recheck 1 month. Continue current regimen.         Digestive   Benign neoplasm of colon    Referral back to GI made today.        Endocrine   Uncontrolled type 2 diabetes with neuropathy (HCC)    Uncontrolled. A1c 11.0 at last visit to last PCP. Had GI upset on metformin. Unclear if she to long acting or short acting. Will start her on long acting metformin and recheck in 1 month. Will obtain records from eye doctor- form faxed today. Continue to monitor.      Relevant Medications   metFORMIN (GLUCOPHAGE XR) 500 MG 24 hr tablet   Hyperlipidemia associated with type 2 diabetes mellitus ([place])    Under good control on last check. Continue atorvastatin. Continue to monitor. Call with any concerns.       Relevant Medications   metFORMIN (GLUCOPHAGE XR) 500 MG 24 hr tablet     Nervous and Auditory   Partial epilepsy with impairment of consciousness ([place])    Continue to follow with neurology. Call with any concerns.         Musculoskeletal and Integument   Osteoarthritis    Will get her started on voltaren due to CKD on last blood work and HTN. Order sent to her pharmacy.         Genitourinary   CKD (chronic kidney disease), stage III    On ACE-inhibitor for renal protection. Will get BP and DM under better control.         Other   History of stroke without residual deficits    No issues right now. Continue to monitor.        Other Visit Diagnoses    Screening for colon cancer       Needs repeat colonoscopy. Would like to  stay in [place] rather than [facility]- referral put in today.   Relevant Orders   Ambulatory referral to Gastroenterology   Breast pain, left       Will obtain mammogram- order put in today.   Relevant Orders   MM Digital Diagnostic Bilat   US BREAST LTD UNI RIGHT INC AXILLA   US BREAST LTD UNI LEFT INC AXILLA   MM DIGITAL SCREENING BILATERAL       Follow up plan: Return in about 4 weeks (around 03/27/2017).

## 2017-02-27 NOTE — Assessment & Plan Note (Signed)
Continue to follow with neurology. Call with any concerns.

## 2017-02-27 NOTE — Assessment & Plan Note (Signed)
Referral back to GI made today.

## 2017-02-27 NOTE — Patient Instructions (Addendum)
DASH Eating Plan DASH stands for "Dietary Approaches to Stop Hypertension." The DASH eating plan is a healthy eating plan that has been shown to reduce high blood pressure (hypertension). It may also reduce your risk for type 2 diabetes, heart disease, and stroke. The DASH eating plan may also help with weight loss. What are tips for following this plan? General guidelines  Avoid eating more than 2,300 mg (milligrams) of salt (sodium) a day. If you have hypertension, you may need to reduce your sodium intake to 1,500 mg a day.  Limit alcohol intake to no more than 1 drink a day for nonpregnant women and 2 drinks a day for men. One drink equals 12 oz of beer, 5 oz of wine, or 1 oz of hard liquor.  Work with your health care provider to maintain a healthy body weight or to lose weight. Ask what an ideal weight is for you.  Get at least 30 minutes of exercise that causes your heart to beat faster (aerobic exercise) most days of the week. Activities may include walking, swimming, or biking.  Work with your health care provider or diet and nutrition specialist (dietitian) to adjust your eating plan to your individual calorie needs. Reading food labels  Check food labels for the amount of sodium per serving. Choose foods with less than 5 percent of the Daily Value of sodium. Generally, foods with less than 300 mg of sodium per serving fit into this eating plan.  To find whole grains, look for the word "whole" as the first word in the ingredient list. Shopping  Buy products labeled as "low-sodium" or "no salt added."  Buy fresh foods. Avoid canned foods and premade or frozen meals. Cooking  Avoid adding salt when cooking. Use salt-free seasonings or herbs instead of table salt or sea salt. Check with your health care provider or pharmacist before using salt substitutes.  Do not fry foods. Cook foods using healthy methods such as baking, boiling, grilling, and broiling instead.  Cook with  heart-healthy oils, such as olive, canola, soybean, or sunflower oil. Meal planning   Eat a balanced diet that includes: ? 5 or more servings of fruits and vegetables each day. At each meal, try to fill half of your plate with fruits and vegetables. ? Up to 6-8 servings of whole grains each day. ? Less than 6 oz of lean meat, poultry, or fish each day. A 3-oz serving of meat is about the same size as a deck of cards. One egg equals 1 oz. ? 2 servings of low-fat dairy each day. ? A serving of nuts, seeds, or beans 5 times each week. ? Heart-healthy fats. Healthy fats called Omega-3 fatty acids are found in foods such as flaxseeds and coldwater fish, like sardines, salmon, and mackerel.  Limit how much you eat of the following: ? Canned or prepackaged foods. ? Food that is high in trans fat, such as fried foods. ? Food that is high in saturated fat, such as fatty meat. ? Sweets, desserts, sugary drinks, and other foods with added sugar. ? Full-fat dairy products.  Do not salt foods before eating.  Try to eat at least 2 vegetarian meals each week.  Eat more home-cooked food and less restaurant, buffet, and fast food.  When eating at a restaurant, ask that your food be prepared with less salt or no salt, if possible. What foods are recommended? The items listed may not be a complete list. Talk with your dietitian about what   dietary choices are best for you. Grains Whole-grain or whole-wheat bread. Whole-grain or whole-wheat pasta. Brown rice. Oatmeal. Quinoa. Bulgur. Whole-grain and low-sodium cereals. Pita bread. Low-fat, low-sodium crackers. Whole-wheat flour tortillas. Vegetables Fresh or frozen vegetables (raw, steamed, roasted, or grilled). Low-sodium or reduced-sodium tomato and vegetable juice. Low-sodium or reduced-sodium tomato sauce and tomato paste. Low-sodium or reduced-sodium canned vegetables. Fruits All fresh, dried, or frozen fruit. Canned fruit in natural juice (without  added sugar). Meat and other protein foods Skinless chicken or turkey. Ground chicken or turkey. Pork with fat trimmed off. Fish and seafood. Egg whites. Dried beans, peas, or lentils. Unsalted nuts, nut butters, and seeds. Unsalted canned beans. Lean cuts of beef with fat trimmed off. Low-sodium, lean deli meat. Dairy Low-fat (1%) or fat-free (skim) milk. Fat-free, low-fat, or reduced-fat cheeses. Nonfat, low-sodium ricotta or cottage cheese. Low-fat or nonfat yogurt. Low-fat, low-sodium cheese. Fats and oils Soft margarine without trans fats. Vegetable oil. Low-fat, reduced-fat, or light mayonnaise and salad dressings (reduced-sodium). Canola, safflower, olive, soybean, and sunflower oils. Avocado. Seasoning and other foods Herbs. Spices. Seasoning mixes without salt. Unsalted popcorn and pretzels. Fat-free sweets. What foods are not recommended? The items listed may not be a complete list. Talk with your dietitian about what dietary choices are best for you. Grains Baked goods made with fat, such as croissants, muffins, or some breads. Dry pasta or rice meal packs. Vegetables Creamed or fried vegetables. Vegetables in a cheese sauce. Regular canned vegetables (not low-sodium or reduced-sodium). Regular canned tomato sauce and paste (not low-sodium or reduced-sodium). Regular tomato and vegetable juice (not low-sodium or reduced-sodium). Pickles. Olives. Fruits Canned fruit in a light or heavy syrup. Fried fruit. Fruit in cream or butter sauce. Meat and other protein foods Fatty cuts of meat. Ribs. Fried meat. Bacon. Sausage. Bologna and other processed lunch meats. Salami. Fatback. Hotdogs. Bratwurst. Salted nuts and seeds. Canned beans with added salt. Canned or smoked fish. Whole eggs or egg yolks. Chicken or turkey with skin. Dairy Whole or 2% milk, cream, and half-and-half. Whole or full-fat cream cheese. Whole-fat or sweetened yogurt. Full-fat cheese. Nondairy creamers. Whipped toppings.  Processed cheese and cheese spreads. Fats and oils Butter. Stick margarine. Lard. Shortening. Ghee. Bacon fat. Tropical oils, such as coconut, palm kernel, or palm oil. Seasoning and other foods Salted popcorn and pretzels. Onion salt, garlic salt, seasoned salt, table salt, and sea salt. Worcestershire sauce. Tartar sauce. Barbecue sauce. Teriyaki sauce. Soy sauce, including reduced-sodium. Steak sauce. Canned and packaged gravies. Fish sauce. Oyster sauce. Cocktail sauce. Horseradish that you find on the shelf. Ketchup. Mustard. Meat flavorings and tenderizers. Bouillon cubes. Hot sauce and Tabasco sauce. Premade or packaged marinades. Premade or packaged taco seasonings. Relishes. Regular salad dressings. Where to find more information:  National Heart, Lung, and Blood Institute: www.nhlbi.nih.gov  American Heart Association: www.heart.org Summary  The DASH eating plan is a healthy eating plan that has been shown to reduce high blood pressure (hypertension). It may also reduce your risk for type 2 diabetes, heart disease, and stroke.  With the DASH eating plan, you should limit salt (sodium) intake to 2,300 mg a day. If you have hypertension, you may need to reduce your sodium intake to 1,500 mg a day.  When on the DASH eating plan, aim to eat more fresh fruits and vegetables, whole grains, lean proteins, low-fat dairy, and heart-healthy fats.  Work with your health care provider or diet and nutrition specialist (dietitian) to adjust your eating plan to your individual   calorie needs. This information is not intended to replace advice given to you by your health care provider. Make sure you discuss any questions you have with your health care provider. Document Released: 11/23/2011 Document Revised: 11/27/2016 Document Reviewed: 11/27/2016 Elsevier Interactive Patient Education  2017 Elsevier Inc.  

## 2017-02-27 NOTE — Assessment & Plan Note (Signed)
No issues right now. Continue to monitor.

## 2017-04-02 ENCOUNTER — Encounter: Payer: Self-pay | Admitting: Family Medicine

## 2017-04-02 ENCOUNTER — Ambulatory Visit (INDEPENDENT_AMBULATORY_CARE_PROVIDER_SITE_OTHER): Payer: Medicare Other | Admitting: Family Medicine

## 2017-04-02 VITALS — BP 122/73 | HR 83 | Temp 97.8°F | Wt 181.1 lb

## 2017-04-02 DIAGNOSIS — IMO0002 Reserved for concepts with insufficient information to code with codable children: Secondary | ICD-10-CM

## 2017-04-02 DIAGNOSIS — E1140 Type 2 diabetes mellitus with diabetic neuropathy, unspecified: Secondary | ICD-10-CM

## 2017-04-02 DIAGNOSIS — E1165 Type 2 diabetes mellitus with hyperglycemia: Secondary | ICD-10-CM

## 2017-04-02 DIAGNOSIS — I129 Hypertensive chronic kidney disease with stage 1 through stage 4 chronic kidney disease, or unspecified chronic kidney disease: Secondary | ICD-10-CM

## 2017-04-02 MED ORDER — SITAGLIPTIN PHOSPHATE 100 MG PO TABS
100.0000 mg | ORAL_TABLET | Freq: Every day | ORAL | 3 refills | Status: DC
Start: 2017-04-02 — End: 2017-07-27

## 2017-04-02 NOTE — Assessment & Plan Note (Signed)
Under good control. Continue current regimen. Continue to monitor. Call with any concerns. 

## 2017-04-02 NOTE — Progress Notes (Signed)
BP 122/73 (BP Location: Left Arm, Patient Position: Sitting, Cuff Size: Large)   Pulse 83   Temp 97.8 F (36.6 C)   Wt 181 lb 1.6 oz (82.1 kg)   SpO2 98%   BMI 30.14 kg/m    Subjective:    Patient ID: Brittany Sherman, female    DOB: October 28, 1946, 71 y.o.   MRN: [id number]  HPI: Brittany Sherman is a 71 y.o. female  Chief Complaint  Patient presents with  . Diabetes  . Hypertension   DIABETES- lots of diarrhea with the metformin, has not been checking her sugars.  Hypoglycemic episodes:no Polydipsia/polyuria: no Visual disturbance: no Chest pain: no Paresthesias: no Glucose Monitoring: no Taking Insulin?: no Blood Pressure Monitoring: not checking Retinal Examination: Up to Date Foot Exam: Up to Date Diabetic Education: Completed Pneumovax: Up to Date Influenza: Up to Date Aspirin: yes  HYPERTENSION Hypertension status: controlled  Satisfied with current treatment? yes Duration of hypertension: chronic BP monitoring frequency:  not checking BP medication side effects:  yes Medication compliance: excellent compliance Previous BP meds: quinapril Aspirin: no Recurrent headaches: no Visual changes: no Palpitations: no Dyspnea: no Chest pain: no Lower extremity edema: no Dizzy/lightheaded: no  Relevant past medical, surgical, family and social history reviewed and updated as indicated. Interim medical history since our last visit reviewed. Allergies and medications reviewed and updated.  Review of Systems  Constitutional: Negative.   Respiratory: Negative.   Cardiovascular: Negative.   Psychiatric/Behavioral: Negative.     Per HPI unless specifically indicated above     Objective:    BP 122/73 (BP Location: Left Arm, Patient Position: Sitting, Cuff Size: Large)   Pulse 83   Temp 97.8 F (36.6 C)   Wt 181 lb 1.6 oz (82.1 kg)   SpO2 98%   BMI 30.14 kg/m   Wt Readings from Last 3 Encounters:  04/02/17 181 lb 1.6 oz (82.1 kg)  02/27/17 191 lb 9.6 oz  (86.9 kg)  07/12/16 185 lb (83.9 kg)    Physical Exam  Constitutional: She is oriented to person, place, and time. She appears well-developed and well-nourished. No distress.  HENT:  Head: Normocephalic and atraumatic.  Right Ear: Hearing normal.  Left Ear: Hearing normal.  Nose: Nose normal.  Eyes: Conjunctivae and lids are normal. Right eye exhibits no discharge. Left eye exhibits no discharge. No scleral icterus.  Cardiovascular: Normal rate, regular rhythm, normal heart sounds and intact distal pulses.  Exam reveals no gallop and no friction rub.   No murmur heard. Pulmonary/Chest: Effort normal and breath sounds normal. No respiratory distress. She has no wheezes. She has no rales. She exhibits no tenderness.  Musculoskeletal: Normal range of motion.  Neurological: She is alert and oriented to person, place, and time.  Skin: Skin is warm, dry and intact. No rash noted. No erythema. No pallor.  Psychiatric: She has a normal mood and affect. Her speech is normal and behavior is normal. Judgment and thought content normal. Cognition and memory are normal.  Nursing note and vitals reviewed.   Results for orders placed or performed in visit on 03/21/17  HM DIABETES EYE EXAM  Result Value Ref Range   HM Diabetic Eye Exam No Retinopathy No Retinopathy      Assessment & Plan:   Problem List Items Addressed This Visit      Endocrine   Uncontrolled type 2 diabetes with neuropathy ([place])    Could not tolerate metformin- bad diarrhea. Will stop it. Will start [place].  Will monitor sugars as she hasn't been checking. Follow up by phone in 2-3 days to titrate insulin. A1c due next month.       Relevant Medications   sitaGLIPtin (JANUVIA) 100 MG tablet   Other Relevant Orders   Basic metabolic panel     Genitourinary   Benign hypertensive renal disease - Primary    Under good control. Continue current regimen. Continue to monitor. Call with any concerns.       Relevant Orders    Basic metabolic panel       Follow up plan: Return in about 4 weeks (around 04/30/2017) for DM visit.

## 2017-04-02 NOTE — Assessment & Plan Note (Signed)
Could not tolerate metformin- bad diarrhea. Will stop it. Will start [place]. Will monitor sugars as she hasn't been checking. Follow up by phone in 2-3 days to titrate insulin. A1c due next month.

## 2017-04-03 ENCOUNTER — Encounter: Payer: Self-pay | Admitting: Family Medicine

## 2017-04-03 LAB — BASIC METABOLIC PANEL
BUN / CREAT RATIO: 14 (ref 12–28)
BUN: 15 mg/dL (ref 8–27)
CO2: 25 mmol/L (ref 18–29)
CREATININE: 1.05 mg/dL — AB (ref 0.57–1.00)
Calcium: 9.9 mg/dL (ref 8.7–10.3)
Chloride: 97 mmol/L (ref 96–106)
GFR calc non Af Amer: 54 mL/min/{1.73_m2} — ABNORMAL LOW (ref >59)
GFR, EST AFRICAN AMERICAN: 62 mL/min/{1.73_m2} (ref >59)
GLUCOSE: 338 mg/dL — AB (ref 65–99)
Potassium: 4.3 mmol/L (ref 3.5–5.2)
SODIUM: 139 mmol/L (ref 134–144)

## 2017-04-05 ENCOUNTER — Telehealth: Payer: Self-pay | Admitting: Family Medicine

## 2017-04-05 NOTE — Telephone Encounter (Signed)
-----   Message from Valerie Roys, DO sent at 04/03/2017  8:22 AM EDT ----- Call about her sugars

## 2017-04-05 NOTE — Telephone Encounter (Signed)
Sugars 211, 217, 271 over the last 3 days.   Not taking the shots right now.   Will have her restart her shots and check in on Monday as to how her sugars have been running.

## 2017-04-05 NOTE — Telephone Encounter (Signed)
Called to check in on her sugars. Unable to leave a message, phone rang >20x.

## 2017-04-09 ENCOUNTER — Emergency Department: Payer: Medicare Other

## 2017-04-09 ENCOUNTER — Encounter: Payer: Self-pay | Admitting: Emergency Medicine

## 2017-04-09 ENCOUNTER — Emergency Department
Admission: EM | Admit: 2017-04-09 | Discharge: 2017-04-09 | Disposition: A | Discharge status: Home / Self Care | Payer: Medicare Other | Attending: Emergency Medicine | Admitting: Emergency Medicine

## 2017-04-09 ENCOUNTER — Telehealth: Payer: Self-pay | Admitting: Family Medicine

## 2017-04-09 DIAGNOSIS — M79662 Pain in left lower leg: Secondary | ICD-10-CM | POA: Diagnosis present

## 2017-04-09 DIAGNOSIS — E1122 Type 2 diabetes mellitus with diabetic chronic kidney disease: Secondary | ICD-10-CM | POA: Insufficient documentation

## 2017-04-09 DIAGNOSIS — I129 Hypertensive chronic kidney disease with stage 1 through stage 4 chronic kidney disease, or unspecified chronic kidney disease: Secondary | ICD-10-CM | POA: Diagnosis not present

## 2017-04-09 DIAGNOSIS — Z87891 Personal history of nicotine dependence: Secondary | ICD-10-CM | POA: Diagnosis not present

## 2017-04-09 DIAGNOSIS — Z794 Long term (current) use of insulin: Secondary | ICD-10-CM | POA: Diagnosis not present

## 2017-04-09 DIAGNOSIS — Z79899 Other long term (current) drug therapy: Secondary | ICD-10-CM | POA: Diagnosis not present

## 2017-04-09 DIAGNOSIS — N182 Chronic kidney disease, stage 2 (mild): Secondary | ICD-10-CM | POA: Insufficient documentation

## 2017-04-09 DIAGNOSIS — M7122 Synovial cyst of popliteal space [Baker], left knee: Secondary | ICD-10-CM | POA: Diagnosis not present

## 2017-04-09 DIAGNOSIS — R52 Pain, unspecified: Secondary | ICD-10-CM

## 2017-04-09 MED ORDER — TRAMADOL HCL 50 MG PO TABS: 50.0000 mg | ORAL_TABLET | Freq: Four times a day (QID) | ORAL | 0 refills | Status: DC | PRN

## 2017-04-09 NOTE — Telephone Encounter (Signed)
Called Brittany Sherman about her sugars to adjust her dose of novolin. Unable to leave a message. Phone range >20 times. It appears she is in the ER for pain right now.   If she calls back, please ask how her sugars have been over the weekend and make sure she is taking both her januvia and her novolin. Thanks!

## 2017-04-09 NOTE — ED Provider Notes (Signed)
[facility] Emergency Department Provider Note   ____________________________________________    I have reviewed the triage vital signs and the nursing notes.   HISTORY  Chief Complaint Leg Pain     HPI Brittany Sherman is a 71 y.o. female who presents with complaints of left lower leg pain for approximately one week. Patient reports pain behind her knee and in her upper calf. She denies injury to the area. No redness, no significant swelling. No fevers or chills. Her foot feels normal   Past Medical History:  Diagnosis Date  . Benign neoplasm of colon   . Benign neoplasm of rectum and anal canal   . Diabetes mellitus without complication ([place])   . History of vertigo   . Hypertension   . Immune deficiency disorder ([place])   . Infectious colitis, enteritis and gastroenteritis   . Partial epilepsy with impairment of consciousness ([place])   . Stroke ([place])   . Trigger finger of right thumb   . Vitamin D deficiency     Patient Active Problem List   Diagnosis Date Noted  . Osteoarthritis 02/27/2017  . CKD (chronic kidney disease), stage III 02/27/2017  . Hyperlipidemia associated with type 2 diabetes mellitus ([place]) 02/27/2017  . Benign hypertensive renal disease   . 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 History of stroke without residual deficits   . Vitamin D deficiency   . Trigger finger of right thumb   . Benign neoplasm of colon   . Partial epilepsy with impairment of consciousness [facility])     Past Surgical History:  Procedure Laterality Date  . ABDOMINAL HYSTERECTOMY    . OOPHORECTOMY    . SPINE SURGERY    . TOTAL HIP ARTHROPLASTY  2017    Prior to Admission medications   Medication Sig Start Date End Date Taking? Authorizing Provider  atorvastatin (LIPITOR) 40 MG tablet Take 40 mg by mouth every morning. 11/17/15   Historical Provider, MD  brimonidine (ALPHAGAN) 0.2 % ophthalmic solution  01/31/17   Historical  Provider, MD  divalproex (DEPAKOTE) 250 MG DR tablet Take 250 mg by mouth 2 (two) times daily. 09/16/15   Historical Provider, MD  glimepiride (AMARYL) 4 MG tablet Take 4 mg by mouth 2 (two) times daily. 09/16/15   Historical Provider, MD  hydrochlorothiazide (HYDRODIURIL) 25 MG tablet Take 25 mg by mouth daily. 10/25/15   Historical Provider, MD  insulin NPH-regular Human (NOVOLIN 70/30) (70-30) 100 UNIT/ML injection Inject 10 Units into the skin 2 (two) times daily with a meal.    Historical Provider, MD  lamoTRIgine (LAMICTAL) 100 MG tablet Take 100 mg by mouth 2 (two) times daily. 09/23/15   Historical Provider, MD  quinapril (ACCUPRIL) 20 MG tablet Take 20 mg by mouth daily. 11/02/15   Historical Provider, MD  sitaGLIPtin (JANUVIA) 100 MG tablet Take 1 tablet (100 mg total) by mouth daily. 04/02/17   Megan P Johnson, DO  traMADol (ULTRAM) 50 MG tablet Take 1 tablet (50 mg total) by mouth every 6 (six) hours as needed. 04/09/17 04/09/18  Lavonia Drafts, MD     Allergies Aspirin; Metformin and related; and Penicillins  Family History  Problem Relation Age of Onset  . Cancer Mother   . Cancer Sister     Social History Social History  Substance Use Topics  . Smoking status: Former Research scientist (life sciences)  . Smokeless tobacco: Never Used  . Alcohol use No    Review of Systems  Constitutional:  No fever/chills Eyes: No visual changes.  ENT: No sore throat. Cardiovascular: Denies chest pain. Respiratory: Denies shortness of breath. Gastrointestinal: No abdominal pain.  No nausea, no vomiting.   Genitourinary: Negative for dysuria. Musculoskeletal: Leg pain as above Skin: Negative for rash. Neurological: Negative for headaches    ____________________________________________   PHYSICAL EXAM:  VITAL SIGNS: ED Triage Vitals [04/09/17 1109]  Enc Vitals Group     BP 111/73     Pulse Rate 79     Resp 16     Temp 98 F (36.7 C)     Temp Source Oral     SpO2 96 %     Weight 181 lb (82.1 kg)      Height 5\' 5"  (1.651 m)     Head Circumference      Peak Flow      Pain Score 10     Pain Loc      Pain Edu?      Excl. in [place]?     Constitutional: Alert and oriented. No acute distress. Pleasant and interactive .   Mouth/Throat: Mucous membranes are moist.    Cardiovascular: Normal rate, regular rhythm. Grossly normal heart sounds.  Good peripheral circulation. Respiratory: Normal respiratory effort.  No retractions. Lungs CTAB. Gastrointestinal: Soft and nontender. No distention.  No CVA tenderness. Genitourinary: deferred Musculoskeletal: No lower extremity tenderness nor edema.  Warm and well perfused, no swelling of the calf or tenderness. 2+ distal pulses Neurologic:  Normal speech and language. No gross focal neurologic deficits are appreciated.  Skin:  Skin is warm, dry and intact. No rash noted. Psychiatric: Mood and affect are normal. Speech and behavior are normal.  ____________________________________________   LABS (all labs ordered are listed, but only abnormal results are displayed)  Labs Reviewed - No data to display ____________________________________________  EKG None ____________________________________________  RADIOLOGY  Ultrasound demonstrates baker cyst ____________________________________________   PROCEDURES  Procedure(s) performed: No    Critical Care performed: No ____________________________________________   INITIAL IMPRESSION / ASSESSMENT AND PLAN / ED COURSE  Pertinent labs & imaging results that were available during my care of the patient were reviewed by me and considered in my medical decision making (see chart for details).  Patient presents with complaints of left lower leg cramping discomfort behind her knee. Ultrasound consistent with Baker's cyst, this is likely the cause of her pain. Extremities warm and well perfused. We will provide analgesics and/or their follow-up     ____________________________________________   FINAL CLINICAL IMPRESSION(S) / ED DIAGNOSES  Final diagnoses:  Pain  Synovial cyst of left popliteal space      NEW MEDICATIONS STARTED DURING THIS VISIT:  Discharge Medication List as of 04/09/2017  2:13 PM    START taking these medications   Details  traMADol (ULTRAM) 50 MG tablet Take 1 tablet (50 mg total) by mouth every 6 (six) hours as needed., Starting Mon 04/09/2017, Until Tue 04/09/2018, Print         Note:  This document was prepared using Dragon voice recognition software and may include unintentional dictation errors.    Lavonia Drafts, MD 04/09/17 [phone]

## 2017-04-09 NOTE — ED Triage Notes (Signed)
Pt reports left leg pain from knee down x1 week, non pitting edema noted.

## 2017-04-09 NOTE — Telephone Encounter (Signed)
-----   Message from Valerie Roys, DO sent at 04/05/2017  1:11 PM EDT ----- Call about her sugars

## 2017-04-25 ENCOUNTER — Telehealth: Payer: Self-pay | Admitting: Family Medicine

## 2017-04-25 NOTE — Telephone Encounter (Signed)
Patient called in regards to returning a call from the provider. Please Advise.  Quin Mcpherson contact number: [phone]  Thank you

## 2017-04-26 NOTE — Telephone Encounter (Signed)
Patient states that it is running from 117-130's Taking  Januvia Glipizide Novolin 70-30  Patient has an appointment with [place] next Wednesday

## 2017-04-26 NOTE — Telephone Encounter (Signed)
Noted. Thanks.

## 2017-04-30 ENCOUNTER — Ambulatory Visit: Payer: Medicare Other | Admitting: Family Medicine

## 2017-04-30 NOTE — Progress Notes (Deleted)
   There were no vitals taken for this visit.   Subjective:    Patient ID: Brittany Sherman, female    DOB: [birth year], 71 y.o.   MRN: [id number]  HPI: Brittany Sherman is a 71 y.o. female  No chief complaint on file.  HYPERTENSION / HYPERLIPIDEMIA Satisfied with current treatment? {Blank single:19197::"yes","no"} Duration of hypertension: {Blank single:19197::"chronic","months","years"} BP monitoring frequency: {Blank single:19197::"not checking","rarely","daily","weekly","monthly","a few times a day","a few times a week","a few times a month"} BP range:  BP medication side effects: {Blank single:19197::"yes","no"} Past BP meds: {Blank XTGGYIRS:85462::"VOJJ","KKXFGHWEXH","BZJIRCVELF/YBOFBPZWCH","ENIDPOEU","MPNTIRWERX","VQMGQQPYPP/JKDT","OIZTIWPYKD (bystolic)","carvedilol","chlorthalidone","clonidine","diltiazem","exforge HCT","HCTZ","irbesartan (avapro)","labetalol","lisinopril","lisinopril-HCTZ","losartan (cozaar)","methyldopa","nifedipine","olmesartan (benicar)","olmesartan-HCTZ","quinapril","ramipril","spironalactone","tekturna","valsartan","valsartan-HCTZ","verapamil"} Duration of hyperlipidemia: {Blank single:19197::"chronic","months","years"} Cholesterol medication side effects: {Blank single:19197::"yes","no"} Cholesterol supplements: {Blank multiple:19196::"none","fish oil","niacin","red yeast rice"} Past cholesterol medications: {Blank multiple:19196::"none","atorvastain (lipitor)","lovastatin (mevacor)","pravastatin (pravachol)","rosuvastatin (crestor)","simvastatin (zocor)","vytorin","fenofibrate (tricor)","gemfibrozil","ezetimide (zetia)","niaspan","lovaza"} Medication compliance: {Blank single:19197::"excellent compliance","good compliance","fair compliance","poor compliance"} Aspirin: {Blank single:19197::"yes","no"} Recent stressors: {Blank single:19197::"yes","no"} Recurrent headaches: {Blank single:19197::"yes","no"} Visual changes: {Blank  single:19197::"yes","no"} Palpitations: {Blank single:19197::"yes","no"} Dyspnea: {Blank single:19197::"yes","no"} Chest pain: {Blank single:19197::"yes","no"} Lower extremity edema: {Blank single:19197::"yes","no"} Dizzy/lightheaded: {Blank single:19197::"yes","no"}  DIABETES Hypoglycemic episodes:{Blank single:19197::"yes","no"} Polydipsia/polyuria: {Blank single:19197::"yes","no"} Visual disturbance: {Blank single:19197::"yes","no"} Chest pain: {Blank single:19197::"yes","no"} Paresthesias: {Blank single:19197::"yes","no"} Glucose Monitoring: {Blank single:19197::"yes","no"}  Accucheck frequency: {Blank single:19197::"Not Checking","Daily","BID","TID"}  Fasting glucose:  Post prandial:  Evening:  Before meals: Taking Insulin?: {Blank single:19197::"yes","no"}  Long acting insulin:  Short acting insulin: Blood Pressure Monitoring: {Blank single:19197::"not checking","rarely","daily","weekly","monthly","a few times a day","a few times a week","a few times a month"} Retinal Examination: {Blank single:19197::"Up to Date","Not up to Date"} Foot Exam: {Blank single:19197::"Up to Date","Not up to Date"} Diabetic Education: {Blank single:19197::"Completed","Not Completed"} Pneumovax: {Blank single:19197::"Up to Date","Not up to Date","unknown"} Influenza: {Blank single:19197::"Up to Date","Not up to Date","unknown"} Aspirin: {Blank single:19197::"yes","no"}  Relevant past medical, surgical, family and social history reviewed and updated as indicated. Interim medical history since our last visit reviewed. Allergies and medications reviewed and updated.  Review of Systems  Per HPI unless specifically indicated above     Objective:    There were no vitals taken for this visit.  Wt Readings from Last 3 Encounters:  04/09/17 181 lb (82.1 kg)  04/02/17 181 lb 1.6 oz (82.1 kg)  02/27/17 191 lb 9.6 oz (86.9 kg)    Physical Exam  Results for orders placed or performed in visit on  98/33/82  Basic metabolic panel  Result Value Ref Range   Glucose 338 (H) 65 - 99 mg/dL   BUN 15 8 - 27 mg/dL   Creatinine, Ser 1.05 (H) 0.57 - 1.00 mg/dL   GFR calc non Af Amer 54 (L) >59 mL/min/1.73   GFR calc Af Amer 62 >59 mL/min/1.73   BUN/Creatinine Ratio 14 12 - 28   Sodium 139 134 - 144 mmol/L   Potassium 4.3 3.5 - 5.2 mmol/L   Chloride 97 96 - 106 mmol/L   CO2 25 18 - 29 mmol/L   Calcium 9.9 8.7 - 10.3 mg/dL      Assessment & Plan:   Problem List Items Addressed This Visit      Endocrine   Uncontrolled type 2 diabetes with neuropathy ([place]) - Primary   Hyperlipidemia associated with type 2 diabetes mellitus ([place])     Genitourinary   Benign hypertensive renal disease       Follow up plan: No Follow-up on file.

## 2017-05-07 ENCOUNTER — Encounter: Payer: Self-pay | Admitting: Family Medicine

## 2017-05-07 ENCOUNTER — Ambulatory Visit (INDEPENDENT_AMBULATORY_CARE_PROVIDER_SITE_OTHER): Payer: Medicare Other | Admitting: Family Medicine

## 2017-05-07 VITALS — BP 136/68 | HR 71 | Temp 97.7°F | Wt 177.2 lb

## 2017-05-07 DIAGNOSIS — IMO0002 Reserved for concepts with insufficient information to code with codable children: Secondary | ICD-10-CM

## 2017-05-07 DIAGNOSIS — E1140 Type 2 diabetes mellitus with diabetic neuropathy, unspecified: Secondary | ICD-10-CM | POA: Diagnosis not present

## 2017-05-07 DIAGNOSIS — E785 Hyperlipidemia, unspecified: Secondary | ICD-10-CM

## 2017-05-07 DIAGNOSIS — I129 Hypertensive chronic kidney disease with stage 1 through stage 4 chronic kidney disease, or unspecified chronic kidney disease: Secondary | ICD-10-CM | POA: Diagnosis not present

## 2017-05-07 DIAGNOSIS — E1169 Type 2 diabetes mellitus with other specified complication: Secondary | ICD-10-CM

## 2017-05-07 DIAGNOSIS — E1165 Type 2 diabetes mellitus with hyperglycemia: Secondary | ICD-10-CM | POA: Diagnosis not present

## 2017-05-07 LAB — BAYER DCA HB A1C WAIVED: HB A1C (BAYER DCA - WAIVED): 9.7 % — ABNORMAL HIGH (ref <7.0)

## 2017-05-07 LAB — MICROALBUMIN, URINE WAIVED
Creatinine, Urine Waived: 300 mg/dL (ref 10–300)
Microalb, Ur Waived: 30 mg/L — ABNORMAL HIGH (ref 0–19)
Microalb/Creat Ratio: <30 mg/g (ref <30)

## 2017-05-07 MED ORDER — INSULIN NPH ISOPHANE & REGULAR (70-30) 100 UNIT/ML ~~LOC~~ SUSP: 13.0000 [IU] | Freq: Two times a day (BID) | SUBCUTANEOUS | 6 refills | Status: DC

## 2017-05-07 NOTE — Assessment & Plan Note (Signed)
Under good control. Continue current regimen. Continue to monitor. Call with any concerns. 

## 2017-05-07 NOTE — Assessment & Plan Note (Signed)
A1c down to 9.7 from 11. Will increase her novalin to 13 units BID and recheck in 4 days by phone. Recheck A1c in 3 months.

## 2017-05-07 NOTE — Progress Notes (Signed)
BP 136/68   Pulse 71   Temp 97.7 F (36.5 C)   Wt 177 lb 3.2 oz (80.4 kg)   SpO2 99%   BMI 29.49 kg/m    Subjective:    Patient ID: Brittany Sherman, female    DOB: November 10, 1946, 71 y.o.   MRN: [id number]  HPI: Brittany Sherman is a 71 y.o. female  Chief Complaint  Patient presents with  . Hypertension  . Hyperlipidemia  . Diabetes   HYPERTENSION / HYPERLIPIDEMIA Satisfied with current treatment? yes Duration of hypertension: chronic BP monitoring frequency: not checking BP medication side effects: no Past BP meds: quinapril, HCTZ Duration of hyperlipidemia: chronic Cholesterol medication side effects: no Cholesterol supplements: none Past cholesterol medications: atorvastain (lipitor) Medication compliance: good compliance Aspirin: no Recent stressors: no Recurrent headaches: no Visual changes: no Palpitations: no Dyspnea: no Chest pain: no Lower extremity edema: no Dizzy/lightheaded: no  DIABETES Hypoglycemic episodes:no Polydipsia/polyuria: no Visual disturbance: no Chest pain: no Paresthesias: no Glucose Monitoring: yes  Accucheck frequency: Every other day  Fasting glucose: 117 Taking Insulin?: yes  Long acting insulin: 10 Units BID Blood Pressure Monitoring: not checking Retinal Examination: Up to Date Foot Exam: Up to Date Diabetic Education: Completed Pneumovax: Up to Date Influenza: Up to Date Aspirin: yes   Relevant past medical, surgical, family and social history reviewed and updated as indicated. Interim medical history since our last visit reviewed. Allergies and medications reviewed and updated.  Review of Systems  Constitutional: Negative.   Respiratory: Negative.   Cardiovascular: Negative.   Gastrointestinal: Negative.   Neurological: Negative.   Psychiatric/Behavioral: Negative.     Per HPI unless specifically indicated above     Objective:    BP 136/68   Pulse 71   Temp 97.7 F (36.5 C)   Wt 177 lb 3.2 oz (80.4 kg)    SpO2 99%   BMI 29.49 kg/m   Wt Readings from Last 3 Encounters:  05/07/17 177 lb 3.2 oz (80.4 kg)  04/09/17 181 lb (82.1 kg)  04/02/17 181 lb 1.6 oz (82.1 kg)    Physical Exam  Constitutional: She is oriented to person, place, and time. She appears well-developed and well-nourished. No distress.  HENT:  Head: Normocephalic and atraumatic.  Right Ear: Hearing normal.  Left Ear: Hearing normal.  Nose: Nose normal.  Eyes: Conjunctivae and lids are normal. Right eye exhibits no discharge. Left eye exhibits no discharge. No scleral icterus.  Cardiovascular: Normal rate, regular rhythm, normal heart sounds and intact distal pulses.  Exam reveals no gallop and no friction rub.   No murmur heard. Pulmonary/Chest: Effort normal and breath sounds normal. No respiratory distress. She has no wheezes. She has no rales. She exhibits no tenderness.  Musculoskeletal: Normal range of motion.  Neurological: She is alert and oriented to person, place, and time.  Skin: Skin is warm, dry and intact. No rash noted. She is not diaphoretic. No erythema. No pallor.  Psychiatric: She has a normal mood and affect. Her speech is normal and behavior is normal. Judgment and thought content normal. Cognition and memory are normal.  Nursing note and vitals reviewed.   Results for orders placed or performed in visit on 05/07/17  Microalbumin, Urine Waived  Result Value Ref Range   Microalb, Ur Waived 30 (H) 0 - 19 mg/L   Creatinine, Urine Waived 300 10 - 300 mg/dL   Microalb/Creat Ratio <30 <30 mg/g  Bayer DCA Hb A1c Waived  Result Value Ref Range  Bayer DCA Hb A1c Waived 9.7 (H) <7.0 %      Assessment & Plan:   Problem List Items Addressed This Visit      Endocrine   Uncontrolled type 2 diabetes with neuropathy ([place]) - Primary    A1c down to 9.7 from 11. Will increase her novalin to 13 units BID and recheck in 4 days by phone. Recheck A1c in 3 months.       Relevant Medications   insulin  NPH-regular Human (NOVOLIN 70/30) (70-30) 100 UNIT/ML injection   Other Relevant Orders   Comprehensive metabolic panel   Microalbumin, Urine Waived (Completed)   Bayer DCA Hb A1c Waived (Completed)   Hyperlipidemia associated with type 2 diabetes mellitus ([place])    Labs drawn today. Await results.       Relevant Medications   insulin NPH-regular Human (NOVOLIN 70/30) (70-30) 100 UNIT/ML injection   Other Relevant Orders   Comprehensive metabolic panel   Lipid Panel w/o Chol/HDL Ratio     Genitourinary   Benign hypertensive renal disease    Under good control. Continue current regimen. Continue to monitor. Call with any concerns.       Relevant Orders   Comprehensive metabolic panel   Microalbumin, Urine Waived (Completed)       Follow up plan: Return in about 3 months (around 08/07/2017) for Diabetes follow up.

## 2017-05-07 NOTE — Assessment & Plan Note (Signed)
Labs drawn today. Await results.  

## 2017-05-07 NOTE — Patient Instructions (Signed)
Increase your novalin to 13 units BID. I'll call you around lunch on Thursday

## 2017-05-08 ENCOUNTER — Encounter: Payer: Self-pay | Admitting: Family Medicine

## 2017-05-08 LAB — COMPREHENSIVE METABOLIC PANEL
A/G RATIO: 1.5 (ref 1.2–2.2)
ALT: 9 IU/L (ref 0–32)
AST: 12 IU/L (ref 0–40)
Albumin: 4.1 g/dL (ref 3.5–4.8)
Alkaline Phosphatase: 72 IU/L (ref 39–117)
BUN/Creatinine Ratio: 19 (ref 12–28)
BUN: 17 mg/dL (ref 8–27)
Bilirubin Total: 0.3 mg/dL (ref 0.0–1.2)
CALCIUM: 9.8 mg/dL (ref 8.7–10.3)
CO2: 26 mmol/L (ref 18–29)
CREATININE: 0.89 mg/dL (ref 0.57–1.00)
Chloride: 100 mmol/L (ref 96–106)
GFR, EST AFRICAN AMERICAN: 75 mL/min/{1.73_m2} (ref >59)
GFR, EST NON AFRICAN AMERICAN: 65 mL/min/{1.73_m2} (ref >59)
GLUCOSE: 239 mg/dL — AB (ref 65–99)
Globulin, Total: 2.7 g/dL (ref 1.5–4.5)
POTASSIUM: 4.3 mmol/L (ref 3.5–5.2)
Sodium: 139 mmol/L (ref 134–144)
TOTAL PROTEIN: 6.8 g/dL (ref 6.0–8.5)

## 2017-05-08 LAB — LIPID PANEL W/O CHOL/HDL RATIO
Cholesterol, Total: 124 mg/dL (ref 100–199)
HDL: 45 mg/dL (ref >39)
LDL Calculated: 60 mg/dL (ref 0–99)
TRIGLYCERIDES: 96 mg/dL (ref 0–149)
VLDL CHOLESTEROL CAL: 19 mg/dL (ref 5–40)

## 2017-05-10 ENCOUNTER — Telehealth: Payer: Self-pay | Admitting: Family Medicine

## 2017-05-10 ENCOUNTER — Other Ambulatory Visit: Payer: Self-pay

## 2017-05-10 MED ORDER — INSULIN NPH ISOPHANE & REGULAR (70-30) 100 UNIT/ML ~~LOC~~ SUSP: 15.0000 [IU] | Freq: Two times a day (BID) | SUBCUTANEOUS | 6 refills | Status: DC

## 2017-05-10 MED ORDER — ATORVASTATIN CALCIUM 40 MG PO TABS: 40.0000 mg | ORAL_TABLET | ORAL | 1 refills | Status: DC

## 2017-05-10 NOTE — Telephone Encounter (Signed)
Called to check on her sugars.   Sugar 118 this AM. Has not not been feeling dizzy.  Will increase her noavalin to 15units BID and recheck on her sugars on Tuesday by phone.

## 2017-05-10 NOTE — Telephone Encounter (Signed)
-----   Message from Valerie Roys, [place] sent at 05/07/2017 12:07 PM EDT ----- Call about her sugars

## 2017-05-10 NOTE — Telephone Encounter (Signed)
Refill request for Atorvastatin 40mg  tablet #90  LV: 05/07/2017

## 2017-05-28 ENCOUNTER — Telehealth: Payer: Self-pay | Admitting: Family Medicine

## 2017-05-28 NOTE — Telephone Encounter (Signed)
Patient notified to call and schedule her mammogram, and let her know that they will contact her from GI to schedule her colonoscopy.

## 2017-05-28 NOTE — Telephone Encounter (Signed)
Patient returned call to [place]. Informed patient will send message for CMA to call back.

## 2017-05-28 NOTE — Telephone Encounter (Signed)
Tried to call patient, no answer, unable to leave a message, will try again.

## 2017-05-28 NOTE — Telephone Encounter (Signed)
Keri: Are you able to check on this for me?

## 2017-05-28 NOTE — Telephone Encounter (Signed)
Referral was to [facility], must have switched during the split to [place]. Referral directed to [facility]. Patient calls and schedules her mammogram. A referral doesn't hit the [facility] for that.  As long as there is an order in place she can call [facility] [phone]

## 2017-05-28 NOTE — Telephone Encounter (Signed)
Patient called to check the status of her referrals for colonoscopy and her mammogram  Thanks

## 2017-06-07 ENCOUNTER — Other Ambulatory Visit: Payer: Self-pay

## 2017-06-07 MED ORDER — QUINAPRIL HCL 20 MG PO TABS: 20.0000 mg | ORAL_TABLET | Freq: Every day | ORAL | 1 refills | Status: DC

## 2017-06-07 NOTE — Telephone Encounter (Signed)
Last OV: 05/07/17 Next OV: 08/13/17  DOES NOT APPEAR TO HAVE BEEN PRESCRIBED BY YOU BEFORE, FYI.   Lab Results  Component Value Date   HGBA1C 11.0 02/01/2017

## 2017-06-08 ENCOUNTER — Encounter: Payer: Self-pay | Admitting: Family Medicine

## 2017-06-08 ENCOUNTER — Ambulatory Visit
Admission: RE | Admit: 2017-06-08 | Discharge: 2017-06-08 | Disposition: A | Discharge status: Home / Self Care | Payer: Medicare Other | Source: Ambulatory Visit | Attending: Family Medicine | Admitting: Family Medicine

## 2017-06-08 DIAGNOSIS — Z1231 Encounter for screening mammogram for malignant neoplasm of breast: Secondary | ICD-10-CM | POA: Insufficient documentation

## 2017-06-08 DIAGNOSIS — N644 Mastodynia: Secondary | ICD-10-CM | POA: Diagnosis not present

## 2017-06-11 ENCOUNTER — Other Ambulatory Visit: Payer: Self-pay

## 2017-06-11 ENCOUNTER — Telehealth: Payer: Self-pay

## 2017-06-11 DIAGNOSIS — Z86010 Personal history of colonic polyps: Secondary | ICD-10-CM

## 2017-06-11 DIAGNOSIS — Z1211 Encounter for screening for malignant neoplasm of colon: Secondary | ICD-10-CM

## 2017-06-11 NOTE — Telephone Encounter (Signed)
Gastroenterology Pre-Procedure Review  Request Date: 07/24/17 Requesting Physician: Dr. Vicente Males  PATIENT REVIEW QUESTIONS: The patient responded to the following health history questions as indicated:    1. Are you having any GI issues? no 2. Do you have a personal history of Polyps? yes (self) 3. Do you have a family history of Colon Cancer or Polyps? no 4. Diabetes Mellitus? yes (Type 2) 5. Joint replacements in the past 12 months?no 6. Major health problems in the past 3 months?no 7. Any artificial heart valves, MVP, or defibrillator?no    MEDICATIONS & ALLERGIES:    Patient reports the following regarding taking any anticoagulation/antiplatelet therapy:   Plavix, Coumadin, Eliquis, Xarelto, Lovenox, Pradaxa, Brilinta, or Effient? no Aspirin? no  Patient confirms/reports the following medications:  Current Outpatient Prescriptions  Medication Sig Dispense Refill  . atorvastatin (LIPITOR) 40 MG tablet Take 1 tablet (40 mg total) by mouth every morning. 90 tablet 1  . brimonidine (ALPHAGAN) 0.2 % ophthalmic solution     . divalproex (DEPAKOTE) 250 MG DR tablet Take 250 mg by mouth 2 (two) times daily.    [place] Kitchen glimepiride (AMARYL) 4 MG tablet Take 4 mg by mouth 2 (two) times daily.    . hydrochlorothiazide (HYDRODIURIL) 25 MG tablet Take 25 mg by mouth daily.    . insulin NPH-regular Human (NOVOLIN 70/30) (70-30) 100 UNIT/ML injection Inject 15 Units into the skin 2 (two) times daily with a meal. 10 mL 6  . lamoTRIgine (LAMICTAL) 100 MG tablet Take 100 mg by mouth 2 (two) times daily.    . quinapril (ACCUPRIL) 20 MG tablet Take 1 tablet (20 mg total) by mouth daily. 90 tablet 1  . sitaGLIPtin (JANUVIA) 100 MG tablet Take 1 tablet (100 mg total) by mouth daily. 30 tablet 3   No current facility-administered medications for this visit.     Patient confirms/reports the following allergies:  Allergies  Allergen Reactions  . Aspirin     Stroke  . Metformin And Related Diarrhea  .  Penicillins Rash    Has patient had a PCN reaction causing immediate rash, facial/tongue/throat swelling, SOB or lightheadedness with hypotension: Yes Has patient had a PCN reaction causing severe rash involving mucus membranes or skin necrosis: No Has patient had a PCN reaction that required hospitalization No Has patient had a PCN reaction occurring within the last 10 years: Yes If all of the above answers are "NO", then may proceed with Cephalosporin use.    No orders of the defined types were placed in this encounter.   AUTHORIZATION INFORMATION Primary Insurance: 1D#: Group #:  Secondary Insurance: 1D#: Group #:  SCHEDULE INFORMATION: Date: 07/24/17 Time: Location:[facility]

## 2017-06-22 ENCOUNTER — Telehealth: Payer: Self-pay | Admitting: Family Medicine

## 2017-06-28 ENCOUNTER — Ambulatory Visit: Payer: Medicare Other

## 2017-07-04 ENCOUNTER — Ambulatory Visit (INDEPENDENT_AMBULATORY_CARE_PROVIDER_SITE_OTHER): Payer: Medicare Other

## 2017-07-04 VITALS — BP 112/60 | HR 64 | Temp 97.7°F | Resp 16 | Ht 65.0 in | Wt 180.4 lb

## 2017-07-04 DIAGNOSIS — Z0000 Encounter for general adult medical examination without abnormal findings: Secondary | ICD-10-CM

## 2017-07-04 NOTE — Progress Notes (Signed)
Subjective:   Brittany Sherman is a 71 y.o. female who presents for Medicare Annual (Subsequent) preventive examination.  Review of Systems:   Cardiac Risk Factors include: hypertension;dyslipidemia;diabetes mellitus;advanced age (>59men, >21 women);obesity (BMI >30kg/m2)     Objective:     Vitals: BP 112/60 (BP Location: Left Arm, Patient Position: Sitting)   Pulse 64   Temp 97.7 F (36.5 C)   Resp 16   Ht 5\' 5"  (1.651 m)   Wt 180 lb 6.4 oz (81.8 kg)   BMI 30.02 kg/m   Body mass index is 30.02 kg/m.   Tobacco History  Smoking Status  . Former Smoker  . Quit date: 12/18/2006  Smokeless Tobacco  . Never Used     Counseling given: Not Answered   Past Medical History:  Diagnosis Date  . Benign neoplasm of rectum and anal canal   . Diabetes mellitus without complication ([place])   . History of vertigo   . Hypertension   . Immune deficiency disorder ([place])   . Infectious colitis, enteritis and gastroenteritis   . Partial epilepsy with impairment of consciousness ([place])   . Stroke ([place])   . Trigger finger of right thumb   . Vitamin D deficiency    Past Surgical History:  Procedure Laterality Date  . ABDOMINAL HYSTERECTOMY    . OOPHORECTOMY    . SPINE SURGERY    . TOTAL HIP ARTHROPLASTY  2017   Family History  Problem Relation Age of Onset  . Brain cancer Mother   . Esophageal cancer Sister   . Breast cancer Cousin   . Aneurysm Sister   . Cancer Sister    History  Sexual Activity  . Sexual activity: No    Outpatient Encounter Prescriptions as of 07/04/2017  Medication Sig  . atorvastatin (LIPITOR) 40 MG tablet Take 1 tablet (40 mg total) by mouth every morning.  . divalproex (DEPAKOTE) 250 MG DR tablet Take 250 mg by mouth 2 (two) times daily.  Marland Kitchen glimepiride (AMARYL) 4 MG tablet Take 4 mg by mouth 2 (two) times daily.  . hydrochlorothiazide (HYDRODIURIL) 25 MG tablet Take 25 mg by mouth daily.  . insulin NPH-regular Human (NOVOLIN 70/30) (70-30) 100  UNIT/ML injection Inject 15 Units into the skin 2 (two) times daily with a meal.  . lamoTRIgine (LAMICTAL) 100 MG tablet Take 100 mg by mouth 2 (two) times daily.  . quinapril (ACCUPRIL) 20 MG tablet Take 1 tablet (20 mg total) by mouth daily.  . sitaGLIPtin (JANUVIA) 100 MG tablet Take 1 tablet (100 mg total) by mouth daily.  . brimonidine (ALPHAGAN) 0.2 % ophthalmic solution    No facility-administered encounter medications on file as of 07/04/2017.     Activities of Daily Living In your present state of health, do you have any difficulty performing the following activities: 07/04/2017 04/02/2017  Hearing? Y N  Vision? Y N  Difficulty concentrating or making decisions? N Y  Walking or climbing stairs? N Y  Dressing or bathing? N N  Doing errands, shopping? N N  Preparing Food and eating ? N -  Using the Toilet? N -  In the past six months, have you accidently leaked urine? N -  Do you have problems with loss of bowel control? N -  Managing your Medications? N -  Managing your Finances? N -  Housekeeping or managing your Housekeeping? N -  Some recent data might be hidden    Patient Care Team: Valerie Roys, DO as PCP -  General (Family Medicine)    Assessment:     Exercise Activities and Dietary recommendations Current Exercise Habits: The patient does not participate in regular exercise at present  Goals    . Increase water intake          Recommend drinking at least 3-4 glasses a day       Fall Risk Fall Risk  07/04/2017 04/02/2017 02/27/2017  Falls in the past year? No No No   Depression Screen PHQ 2/9 Scores 07/04/2017 04/02/2017 04/02/2017  PHQ - 2 Score 0 0 0  PHQ- 9 Score - 2 -     Cognitive Function     6CIT Screen 07/04/2017  What Year? 0 points  What month? 0 points  What time? 0 points  Count back from 20 0 points  Months in reverse 0 points  Repeat phrase 0 points  Total Score 0    Immunization History  Administered Date(s) Administered  .  Influenza,inj,Quad PF,36+ Mos 11/02/2016  . Pneumococcal Conjugate-13 11/23/2015  . Pneumococcal Polysaccharide-23 09/29/2013  . Tdap 10/16/2011   Screening Tests Health Maintenance  Topic Date Due  . COLONOSCOPY  07/24/2017 (Originally 02/11/2017)  . Hepatitis C Screening  04/02/2018 (Originally October 26, 1946)  . INFLUENZA VACCINE  07/18/2017  . HEMOGLOBIN A1C  11/07/2017  . OPHTHALMOLOGY EXAM  12/27/2017  . FOOT EXAM  02/27/2018  . MAMMOGRAM  06/09/2019  . TETANUS/TDAP  10/15/2021  . DEXA SCAN  Completed  . PNA vac Low Risk Adult  Completed      Plan:     I have personally reviewed and addressed the Medicare Annual Wellness questionnaire and have noted the following in the patient's chart:  A. Medical and social history B. Use of alcohol, tobacco or illicit drugs  C. Current medications and supplements D. Functional ability and status E.  Nutritional status F.  Physical activity G. Advance directives H. List of other physicians I.  Hospitalizations, surgeries, and ER visits in previous 12 months J.  [place] such as hearing and vision if needed, cognitive and depression L. Referrals and appointments  In addition, I have reviewed and discussed with patient certain preventive protocols, quality metrics, and best practice recommendations. A written personalized care plan for preventive services as well as general preventive health recommendations were provided to patient.   Signed,  Tyler Aas, LPN Nurse Health Advisor   MD Recommendations: none

## 2017-07-04 NOTE — Patient Instructions (Addendum)
Brittany Sherman  Thank you for taking time to come for your Medicare Wellness Visit. I appreciate your ongoing commitment to your health goals. Please review the following plan we discussed and let me know if I can assist you in the future.   Screening recommendations/referrals: Colonoscopy: Appt on 07/24/2017  Mammogram: completed 06/08/2017 Bone Density: completed 05/14/2014 Recommended yearly ophthalmology/optometry visit for glaucoma screening and checkup Recommended yearly dental visit for hygiene and checkup  Vaccinations: Influenza vaccine: up to date, due 08/2017 Pneumococcal vaccine: up to date Tdap vaccine: up to date Shingles vaccine: due, check with your insurance company for coverage  Advanced directives: Advance directive discussed with you today. I have provided a copy for you to complete at home and have notarized. Once this is complete please bring a copy in to our office so we can scan it into your chart.  Conditions/risks identified: Recommend drinking at least 3-4 glasses a day   Next appointment: Follow up with Dr.Johnson on 08/13/2017 at 9:15am. Follow up in one year for your annual wellness exam.   Preventive Care 65 Years and Older, Female Preventive care refers to lifestyle choices and visits with your health care provider that can promote health and wellness. What does preventive care include?  A yearly physical exam. This is also called an annual well check.  Dental exams once or twice a year.  Routine eye exams. Ask your health care provider how often you should have your eyes checked.  Personal lifestyle choices, including:  Daily care of your teeth and gums.  Regular physical activity.  Eating a healthy diet.  Avoiding tobacco and drug use.  Limiting alcohol use.  Practicing safe sex.  Taking low-dose aspirin every day.  Taking vitamin and mineral supplements as recommended by your health care provider. What happens during an annual well  check? The services and screenings done by your health care provider during your annual well check will depend on your age, overall health, lifestyle risk factors, and family history of disease. Counseling  Your health care provider may ask you questions about your:  Alcohol use.  Tobacco use.  Drug use.  Emotional well-being.  Home and relationship well-being.  Sexual activity.  Eating habits.  History of falls.  Memory and ability to understand (cognition).  Work and work Statistician.  Reproductive health. Screening  You may have the following tests or measurements:  Height, weight, and BMI.  Blood pressure.  Lipid and cholesterol levels. These may be checked every 5 years, or more frequently if you are over 39 years old.  Skin check.  Lung cancer screening. You may have this screening every year starting at age 44 if you have a 30-pack-year history of smoking and currently smoke or have quit within the past 15 years.  Fecal occult blood test (FOBT) of the stool. You may have this test every year starting at age 70.  Flexible sigmoidoscopy or colonoscopy. You may have a sigmoidoscopy every 5 years or a colonoscopy every 10 years starting at age 18.  Hepatitis C blood test.  Hepatitis B blood test.  Sexually transmitted disease (STD) testing.  Diabetes screening. This is done by checking your blood sugar (glucose) after you have not eaten for a while (fasting). You may have this done every 1-3 years.  Bone density scan. This is done to screen for osteoporosis. You may have this done starting at age 25.  Mammogram. This may be done every 1-2 years. Talk to your health care provider about  how often you should have regular mammograms. Talk with your health care provider about your test results, treatment options, and if necessary, the need for more tests. Vaccines  Your health care provider may recommend certain vaccines, such as:  Influenza vaccine. This is  recommended every year.  Tetanus, diphtheria, and acellular pertussis (Tdap, Td) vaccine. You may need a Td booster every 10 years.  Zoster vaccine. You may need this after age 107.  Pneumococcal 13-valent conjugate (PCV13) vaccine. One dose is recommended after age 60.  Pneumococcal polysaccharide (PPSV23) vaccine. One dose is recommended after age 9. Talk to your health care provider about which screenings and vaccines you need and how often you need them. This information is not intended to replace advice given to you by your health care provider. Make sure you discuss any questions you have with your health care provider. Document Released: 12/31/2015 Document Revised: 08/23/2016 Document Reviewed: 10/05/2015 Elsevier Interactive Patient Education  2017 [place] Prevention in the Home Falls can cause injuries. They can happen to people of all ages. There are many things you can do to make your home safe and to help prevent falls. What can I do on the outside of my home?  Regularly fix the edges of walkways and driveways and fix any cracks.  Remove anything that might make you trip as you walk through a door, such as a raised step or threshold.  Trim any bushes or trees on the path to your home.  Use bright outdoor lighting.  Clear any walking paths of anything that might make someone trip, such as rocks or tools.  Regularly check to see if handrails are loose or broken. Make sure that both sides of any steps have handrails.  Any raised decks and porches should have guardrails on the edges.  Have any leaves, snow, or ice cleared regularly.  Use sand or salt on walking paths during winter.  Clean up any spills in your garage right away. This includes oil or grease spills. What can I do in the bathroom?  Use night lights.  Install grab bars by the toilet and in the tub and shower. Do not use towel bars as grab bars.  Use non-skid mats or decals in the tub or  shower.  If you need to sit down in the shower, use a plastic, non-slip stool.  Keep the floor dry. Clean up any water that spills on the floor as soon as it happens.  Remove soap buildup in the tub or shower regularly.  Attach bath mats securely with double-sided non-slip rug tape.  Do not have throw rugs and other things on the floor that can make you trip. What can I do in the bedroom?  Use night lights.  Make sure that you have a light by your bed that is easy to reach.  Do not use any sheets or blankets that are too big for your bed. They should not hang down onto the floor.  Have a firm chair that has side arms. You can use this for support while you get dressed.  Do not have throw rugs and other things on the floor that can make you trip. What can I do in the kitchen?  Clean up any spills right away.  Avoid walking on wet floors.  Keep items that you use a lot in easy-to-reach places.  If you need to reach something above you, use a strong step stool that has a grab bar.  Keep  electrical cords out of the way.  Do not use floor polish or wax that makes floors slippery. If you must use wax, use non-skid floor wax.  Do not have throw rugs and other things on the floor that can make you trip. What can I do with my stairs?  Do not leave any items on the stairs.  Make sure that there are handrails on both sides of the stairs and use them. Fix handrails that are broken or loose. Make sure that handrails are as long as the stairways.  Check any carpeting to make sure that it is firmly attached to the stairs. Fix any carpet that is loose or worn.  Avoid having throw rugs at the top or bottom of the stairs. If you do have throw rugs, attach them to the floor with carpet tape.  Make sure that you have a light switch at the top of the stairs and the bottom of the stairs. If you do not have them, ask someone to add them for you. What else can I do to help prevent  falls?  Wear shoes that:  Do not have high heels.  Have rubber bottoms.  Are comfortable and fit you well.  Are closed at the toe. Do not wear sandals.  If you use a stepladder:  Make sure that it is fully opened. Do not climb a closed stepladder.  Make sure that both sides of the stepladder are locked into place.  Ask someone to hold it for you, if possible.  Clearly mark and make sure that you can see:  Any grab bars or handrails.  First and last steps.  Where the edge of each step is.  Use tools that help you move around (mobility aids) if they are needed. These include:  Canes.  Walkers.  Scooters.  Crutches.  Turn on the lights when you go into a dark area. Replace any light bulbs as soon as they burn out.  Set up your furniture so you have a clear path. Avoid moving your furniture around.  If any of your floors are uneven, fix them.  If there are any pets around you, be aware of where they are.  Review your medicines with your doctor. Some medicines can make you feel dizzy. This can increase your chance of falling. Ask your doctor what other things that you can do to help prevent falls. This information is not intended to replace advice given to you by your health care provider. Make sure you discuss any questions you have with your health care provider. Document Released: 09/30/2009 Document Revised: 05/11/2016 Document Reviewed: 01/08/2015 Elsevier Interactive Patient Education  2017 [employer].

## 2017-07-10 ENCOUNTER — Ambulatory Visit (INDEPENDENT_AMBULATORY_CARE_PROVIDER_SITE_OTHER): Payer: Medicare Other | Admitting: Family Medicine

## 2017-07-10 ENCOUNTER — Encounter: Payer: Self-pay | Admitting: Family Medicine

## 2017-07-10 DIAGNOSIS — S61309A Unspecified open wound of unspecified finger with damage to nail, initial encounter: Secondary | ICD-10-CM | POA: Diagnosis not present

## 2017-07-10 NOTE — Assessment & Plan Note (Signed)
Extensive debridement of avulsion of fingernail patient tolerated procedure well.

## 2017-07-10 NOTE — Progress Notes (Signed)
BP 130/71   Pulse 93   Wt 179 lb (81.2 kg)   SpO2 97%   BMI 29.79 kg/m    Subjective:    Patient ID: Brittany Sherman, female    DOB: Jul 21, 1946, 71 y.o.   MRN: [id number]  HPI: Brittany Sherman is a 71 y.o. female  Chief Complaint  Patient presents with  . Fingernail  Patient called to her artificial nail on some fabric and partially removed this was several weeks ago now it's loose flapping very uncomfortable want to come off.  Relevant past medical, surgical, family and social history reviewed and updated as indicated. Interim medical history since our last visit reviewed. Allergies and medications reviewed and updated.  Review of Systems  Constitutional: Negative.   Respiratory: Negative.   Cardiovascular: Negative.     Per HPI unless specifically indicated above     Objective:    BP 130/71   Pulse 93   Wt 179 lb (81.2 kg)   SpO2 97%   BMI 29.79 kg/m   Wt Readings from Last 3 Encounters:  07/10/17 179 lb (81.2 kg)  07/04/17 180 lb 6.4 oz (81.8 kg)  05/07/17 177 lb 3.2 oz (80.4 kg)    Physical Exam  Constitutional: She is oriented to person, place, and time. She appears well-developed and well-nourished.  HENT:  Head: Normocephalic and atraumatic.  Eyes: Conjunctivae and EOM are normal.  Neck: Normal range of motion.  Cardiovascular: Normal rate, regular rhythm and normal heart sounds.   Pulmonary/Chest: Effort normal and breath sounds normal.  Musculoskeletal: Normal range of motion.  Neurological: She is alert and oriented to person, place, and time.  Skin: No erythema.  Fingernail with partial avulsion with acrylic fingernail overlay. This area was extensively debrided to very slowly and gently as this was very painful to the patient with underlying partial avulsion. Was able to debride nearly all of the artificial nail leaving only a small portion of the nail covering the exposed nail plate.  Psychiatric: She has a normal mood and affect. Her behavior is  normal. Judgment and thought content normal.    Results for orders placed or performed in visit on 05/07/17  Comprehensive metabolic panel  Result Value Ref Range   Glucose 239 (H) 65 - 99 mg/dL   BUN 17 8 - 27 mg/dL   Creatinine, Ser 0.89 0.57 - 1.00 mg/dL   GFR calc non Af Amer 65 >59 mL/min/1.73   GFR calc Af Amer 75 >59 mL/min/1.73   BUN/Creatinine Ratio 19 12 - 28   Sodium 139 134 - 144 mmol/L   Potassium 4.3 3.5 - 5.2 mmol/L   Chloride 100 96 - 106 mmol/L   CO2 26 18 - 29 mmol/L   Calcium 9.8 8.7 - 10.3 mg/dL   Total Protein 6.8 6.0 - 8.5 g/dL   Albumin 4.1 3.5 - 4.8 g/dL   Globulin, Total 2.7 1.5 - 4.5 g/dL   Albumin/Globulin Ratio 1.5 1.2 - 2.2   Bilirubin Total 0.3 0.0 - 1.2 mg/dL   Alkaline Phosphatase 72 39 - 117 IU/L   AST 12 0 - 40 IU/L   ALT 9 0 - 32 IU/L  Lipid Panel w/o Chol/HDL Ratio  Result Value Ref Range   Cholesterol, Total 124 100 - 199 mg/dL   Triglycerides 96 0 - 149 mg/dL   HDL 45 >39 mg/dL   VLDL Cholesterol Cal 19 5 - 40 mg/dL   LDL Calculated 60 0 - 99 mg/dL  Microalbumin,  Urine Waived  Result Value Ref Range   Microalb, Ur Waived 30 (H) 0 - 19 mg/L   Creatinine, Urine Waived 300 10 - 300 mg/dL   Microalb/Creat Ratio <30 <30 mg/g  Bayer DCA Hb A1c Waived  Result Value Ref Range   Bayer DCA Hb A1c Waived 9.7 (H) <7.0 %      Assessment & Plan:   Problem List Items Addressed This Visit      Musculoskeletal and Integument   Traumatic avulsion of nail plate of finger    Extensive debridement of avulsion of fingernail patient tolerated procedure well.          Follow up plan: Return if symptoms worsen or fail to improve.

## 2017-07-13 ENCOUNTER — Telehealth: Payer: Self-pay

## 2017-07-13 MED ORDER — GLIMEPIRIDE 4 MG PO TABS: 4.0000 mg | ORAL_TABLET | Freq: Two times a day (BID) | ORAL | 1 refills | Status: DC

## 2017-07-13 NOTE — Telephone Encounter (Signed)
Requesting a refill on Glimepiride 4mg   Take one by mouth twice daily  [facility] [address]

## 2017-07-23 ENCOUNTER — Encounter: Payer: Self-pay | Admitting: *Deleted

## 2017-07-24 ENCOUNTER — Encounter: Payer: Self-pay | Admitting: Anesthesiology

## 2017-07-24 ENCOUNTER — Ambulatory Visit
Admission: RE | Admit: 2017-07-24 | Discharge: 2017-07-24 | Disposition: A | Discharge status: Home / Self Care | Payer: Medicare Other | Source: Ambulatory Visit | Attending: Gastroenterology | Admitting: Gastroenterology

## 2017-07-24 ENCOUNTER — Ambulatory Visit: Payer: Medicare Other | Admitting: Anesthesiology

## 2017-07-24 ENCOUNTER — Encounter
Admission: RE | Disposition: A | Discharge status: Home / Self Care | Payer: Self-pay | Source: Ambulatory Visit | Attending: Gastroenterology

## 2017-07-24 DIAGNOSIS — D849 Immunodeficiency, unspecified: Secondary | ICD-10-CM | POA: Insufficient documentation

## 2017-07-24 DIAGNOSIS — D122 Benign neoplasm of ascending colon: Secondary | ICD-10-CM

## 2017-07-24 DIAGNOSIS — E559 Vitamin D deficiency, unspecified: Secondary | ICD-10-CM | POA: Diagnosis not present

## 2017-07-24 DIAGNOSIS — K621 Rectal polyp: Secondary | ICD-10-CM | POA: Insufficient documentation

## 2017-07-24 DIAGNOSIS — Z7982 Long term (current) use of aspirin: Secondary | ICD-10-CM | POA: Diagnosis not present

## 2017-07-24 DIAGNOSIS — E119 Type 2 diabetes mellitus without complications: Secondary | ICD-10-CM | POA: Diagnosis not present

## 2017-07-24 DIAGNOSIS — Z96649 Presence of unspecified artificial hip joint: Secondary | ICD-10-CM | POA: Insufficient documentation

## 2017-07-24 DIAGNOSIS — K640 First degree hemorrhoids: Secondary | ICD-10-CM | POA: Diagnosis not present

## 2017-07-24 DIAGNOSIS — Z794 Long term (current) use of insulin: Secondary | ICD-10-CM | POA: Diagnosis not present

## 2017-07-24 DIAGNOSIS — Z79899 Other long term (current) drug therapy: Secondary | ICD-10-CM | POA: Insufficient documentation

## 2017-07-24 DIAGNOSIS — Z86010 Personal history of colonic polyps: Secondary | ICD-10-CM | POA: Diagnosis not present

## 2017-07-24 DIAGNOSIS — K635 Polyp of colon: Secondary | ICD-10-CM | POA: Diagnosis not present

## 2017-07-24 DIAGNOSIS — D125 Benign neoplasm of sigmoid colon: Secondary | ICD-10-CM

## 2017-07-24 DIAGNOSIS — Z800 Family history of malignant neoplasm of digestive organs: Secondary | ICD-10-CM | POA: Diagnosis not present

## 2017-07-24 DIAGNOSIS — Z1211 Encounter for screening for malignant neoplasm of colon: Secondary | ICD-10-CM | POA: Insufficient documentation

## 2017-07-24 DIAGNOSIS — Z87891 Personal history of nicotine dependence: Secondary | ICD-10-CM | POA: Insufficient documentation

## 2017-07-24 DIAGNOSIS — Z8719 Personal history of other diseases of the digestive system: Secondary | ICD-10-CM | POA: Insufficient documentation

## 2017-07-24 DIAGNOSIS — I10 Essential (primary) hypertension: Secondary | ICD-10-CM | POA: Diagnosis not present

## 2017-07-24 DIAGNOSIS — Z8673 Personal history of transient ischemic attack (TIA), and cerebral infarction without residual deficits: Secondary | ICD-10-CM | POA: Diagnosis not present

## 2017-07-24 DIAGNOSIS — G40802 Other epilepsy, not intractable, without status epilepticus: Secondary | ICD-10-CM | POA: Diagnosis not present

## 2017-07-24 DIAGNOSIS — Z880 Allergy status to penicillin: Secondary | ICD-10-CM | POA: Diagnosis not present

## 2017-07-24 DIAGNOSIS — Z90710 Acquired absence of both cervix and uterus: Secondary | ICD-10-CM | POA: Diagnosis not present

## 2017-07-24 DIAGNOSIS — Z888 Allergy status to other drugs, medicaments and biological substances status: Secondary | ICD-10-CM | POA: Insufficient documentation

## 2017-07-24 HISTORY — PX: COLONOSCOPY WITH PROPOFOL: SHX5780

## 2017-07-24 HISTORY — DX: Unspecified convulsions: R56.9

## 2017-07-24 HISTORY — DX: Unspecified osteoarthritis, unspecified site: M19.90

## 2017-07-24 LAB — GLUCOSE, CAPILLARY: GLUCOSE-CAPILLARY: 191 mg/dL — AB (ref 65–99)

## 2017-07-24 SURGERY — COLONOSCOPY WITH PROPOFOL
Anesthesia: General

## 2017-07-24 MED ORDER — SODIUM CHLORIDE 0.9 % IV SOLN: INTRAVENOUS | Status: DC

## 2017-07-24 MED ORDER — PROPOFOL 500 MG/50ML IV EMUL
INTRAVENOUS | Status: DC | PRN
  Administered 2017-07-24: 100 ug/kg/min via INTRAVENOUS

## 2017-07-24 MED ORDER — FENTANYL CITRATE (PF) 100 MCG/2ML IJ SOLN
INTRAMUSCULAR | Status: AC
  Filled 2017-07-24: qty 2

## 2017-07-24 MED ORDER — ONDANSETRON HCL 4 MG/2ML IJ SOLN
4.0000 mg | Freq: Once | INTRAMUSCULAR | Status: AC | PRN
  Administered 2017-07-24: 4 mg via INTRAVENOUS

## 2017-07-24 MED ORDER — FENTANYL CITRATE (PF) 100 MCG/2ML IJ SOLN: 25.0000 ug | INTRAMUSCULAR | Status: DC | PRN

## 2017-07-24 MED ORDER — FENTANYL CITRATE (PF) 100 MCG/2ML IJ SOLN
INTRAMUSCULAR | Status: DC | PRN
  Administered 2017-07-24: 50 ug via INTRAVENOUS

## 2017-07-24 MED ORDER — PROPOFOL 500 MG/50ML IV EMUL
INTRAVENOUS | Status: AC
  Filled 2017-07-24: qty 50

## 2017-07-24 MED ORDER — MIDAZOLAM HCL 2 MG/2ML IJ SOLN
INTRAMUSCULAR | Status: AC
  Filled 2017-07-24: qty 2

## 2017-07-24 MED ORDER — SODIUM CHLORIDE 0.9 % IV SOLN
INTRAVENOUS | Status: DC
  Administered 2017-07-24: 10:00:00 via INTRAVENOUS

## 2017-07-24 MED ORDER — MIDAZOLAM HCL 2 MG/2ML IJ SOLN
INTRAMUSCULAR | Status: DC | PRN
  Administered 2017-07-24: 2 mg via INTRAVENOUS

## 2017-07-24 NOTE — Anesthesia Post-op Follow-up Note (Signed)
Anesthesia QCDR form completed.        

## 2017-07-24 NOTE — Transfer of Care (Signed)
Immediate Anesthesia Transfer of Care Note  Patient: Brittany Sherman  Procedure(s) Performed: Procedure(s): COLONOSCOPY WITH PROPOFOL (N/A)  Patient Location: PACU  Anesthesia Type:General  Level of Consciousness: awake and sedated  Airway & Oxygen Therapy: Patient Spontanous Breathing and Patient connected to nasal cannula oxygen  Post-op Assessment: Report given to RN and Post -op Vital signs reviewed and stable  Post vital signs: Reviewed and stable  Last Vitals:  Vitals:   07/24/17 0813  BP: (!) 169/75  Pulse: 82  Resp: 20    Last Pain: There were no vitals filed for this visit.       Complications: No apparent anesthesia complications

## 2017-07-24 NOTE — H&P (Signed)
Jonathon Bellows MD [address], [place], [place] [address] Phone: [phone] Fax : [phone]  Primary Care Physician:  Valerie Roys, DO Primary Gastroenterologist:  Dr. Jonathon Bellows   Pre-Procedure History & Physical: HPI:  Brittany Sherman is a 72 y.o. female is here for an colonoscopy.   Past Medical History:  Diagnosis Date  . Arthritis   . Benign neoplasm of rectum and anal canal   . Diabetes mellitus without complication ([place])   . History of vertigo   . Hypertension   . Immune deficiency disorder ([place])   . Infectious colitis, enteritis and gastroenteritis   . Partial epilepsy with impairment of consciousness ([place])   . Seizures ([place])   . Stroke ([place])   . Trigger finger of right thumb   . Vitamin D deficiency     Past Surgical History:  Procedure Laterality Date  . ABDOMINAL HYSTERECTOMY    . OOPHORECTOMY    . SPINE SURGERY    . TOTAL HIP ARTHROPLASTY  2017    Prior to Admission medications   Medication Sig Start Date End Date Taking? Authorizing Provider  atorvastatin (LIPITOR) 40 MG tablet Take 1 tablet (40 mg total) by mouth every morning. 05/10/17  Yes Johnson, Megan P, DO  divalproex (DEPAKOTE) 250 MG DR tablet Take 250 mg by mouth 2 (two) times daily. 09/16/15  Yes [provider]  glimepiride (AMARYL) 4 MG tablet Take 1 tablet (4 mg total) by mouth 2 (two) times daily. 07/13/17  Yes Johnson, Megan P, DO  hydrochlorothiazide (HYDRODIURIL) 25 MG tablet Take 25 mg by mouth daily. 10/25/15  Yes [provider]  insulin NPH-regular Human (NOVOLIN 70/30) (70-30) 100 UNIT/ML injection Inject 15 Units into the skin 2 (two) times daily with a meal. 05/10/17  Yes Johnson, Megan P, DO  lamoTRIgine (LAMICTAL) 100 MG tablet Take 100 mg by mouth 2 (two) times daily. 09/23/15  Yes [provider]  quinapril (ACCUPRIL) 20 MG tablet Take 1 tablet (20 mg total) by mouth daily. 06/07/17  Yes Johnson, Megan P, DO  sitaGLIPtin (JANUVIA) 100 MG tablet  Take 1 tablet (100 mg total) by mouth daily. 04/02/17  Yes Johnson, Megan P, DO  brimonidine (ALPHAGAN) 0.2 % ophthalmic solution  01/31/17   [provider]    Allergies as of 06/11/2017 - Review Complete 05/07/2017  Allergen Reaction Noted  . Aspirin  12/11/2015  . Metformin and related Diarrhea 04/02/2017  . Penicillins Rash 12/11/2015    Family History  Problem Relation Age of Onset  . Brain cancer Mother   . Esophageal cancer Sister   . Breast cancer Cousin   . Aneurysm Sister   . Cancer Sister     Social History   Social History  . Marital status: Married    Spouse name: N/A  . Number of children: N/A  . Years of education: N/A   Occupational History  . Not on file.   Social History Main Topics  . Smoking status: Former Smoker    Quit date: 12/18/2006  . Smokeless tobacco: Never Used  . Alcohol use No  . Drug use: No  . Sexual activity: No   Other Topics Concern  . Not on file   Social History Narrative  . No narrative on file    Review of Systems: See HPI, otherwise negative ROS  Physical Exam: BP (!) 169/75   Pulse 82   Resp 20   Ht 5\' 5"  (1.651 m)   Wt 180 lb (  81.6 kg)   SpO2 98%   BMI 29.95 kg/m  General:   Alert,  pleasant and cooperative in NAD Head:  Normocephalic and atraumatic. Neck:  Supple; no masses or thyromegaly. Lungs:  Clear throughout to auscultation.    Heart:  Regular rate and rhythm. Abdomen:  Soft, nontender and nondistended. Normal bowel sounds, without guarding, and without rebound.   Neurologic:  Alert and  oriented x4;  grossly normal neurologically.  Impression/Plan: Brittany Sherman is here for an colonoscopy to be performed for surveillance due to prior history of colon polyps  Risks, benefits, limitations, and alternatives regarding  colonoscopy have been reviewed with the patient.  Questions have been answered.  All parties agreeable.   Jonathon Bellows, MD  07/24/2017, 9:27 AM

## 2017-07-24 NOTE — Anesthesia Postprocedure Evaluation (Signed)
Anesthesia Post Note  Patient: Brittany Sherman  Procedure(s) Performed: Procedure(s) (LRB): COLONOSCOPY WITH PROPOFOL (N/A)  Patient location during evaluation: PACU Anesthesia Type: General Level of consciousness: awake and alert and oriented Pain management: pain level controlled Vital Signs Assessment: post-procedure vital signs reviewed and stable Respiratory status: spontaneous breathing Cardiovascular status: blood pressure returned to baseline Anesthetic complications: no     Last Vitals:  Vitals:   07/24/17 1025 07/24/17 1035  BP: (!) 172/90 (!) 164/77  Pulse: 77 72  Resp: 14 16  Temp:      Last Pain: There were no vitals filed for this visit.               Monifah Freehling

## 2017-07-24 NOTE — Op Note (Signed)
[facility] Gastroenterology Patient Name: Brittany Sherman Procedure Date: 07/24/2017 9:23 AM MRN: [id number] Account #: [id number] Date of Birth: 09/16/1946 Admit Type: Outpatient Age: 71 Room: [facility] ENDO ROOM 1 Gender: Female Note Status: Finalized Procedure:            Colonoscopy Indications:          High risk colon cancer surveillance: Personal history                        of colonic polyps Providers:            Jonathon Bellows MD, MD Referring MD:         Valerie Roys (Referring MD) Medicines:            Monitored Anesthesia Care Complications:        No immediate complications. Procedure:            Pre-Anesthesia Assessment:                       - Prior to the procedure, a History and Physical was                        performed, and patient medications, allergies and                        sensitivities were reviewed. The patient's tolerance of                        previous anesthesia was reviewed.                       - The risks and benefits of the procedure and the                        sedation options and risks were discussed with the                        patient. All questions were answered and informed                        consent was obtained.                       - ASA Grade Assessment: III - A patient with severe                        systemic disease.                       After obtaining informed consent, the colonoscope was                        passed under direct vision. Throughout the procedure,                        the patient's blood pressure, pulse, and oxygen                        saturations were monitored continuously. The                        Colonoscope  was introduced through the anus and                        advanced to the the cecum, identified by the                        appendiceal orifice, IC valve and transillumination.                        The colonoscopy was performed with ease. The patient                         tolerated the procedure well. The quality of the bowel                        preparation was good. Findings:      The perianal and digital rectal examinations were normal.      Non-bleeding internal hemorrhoids were found during retroflexion. The       hemorrhoids were medium-sized and Grade I (internal hemorrhoids that do       not prolapse).      Six sessile polyps were found in the ascending colon. The polyps were 5       to 8 mm in size. These polyps were removed with a cold snare. Resection       and retrieval were complete.      Three sessile polyps were found in the sigmoid colon. The polyps were 5       to 6 mm in size. These polyps were removed with a cold snare. Resection       and retrieval were complete.      A 5 mm polyp was found in the rectum. The polyp was sessile. The polyp       was removed with a cold snare. Resection and retrieval were complete.      The exam was otherwise without abnormality on direct and retroflexion       views. Impression:           - Non-bleeding internal hemorrhoids.                       - Six 5 to 8 mm polyps in the ascending colon, removed                        with a cold snare. Resected and retrieved.                       - Three 5 to 6 mm polyps in the sigmoid colon, removed                        with a cold snare. Resected and retrieved.                       - One 5 mm polyp in the rectum, removed with a cold                        snare. Resected and retrieved.                       - The examination was otherwise normal on direct and  retroflexion views. Recommendation:       - Discharge patient to home (with escort).                       - Resume previous diet.                       - Continue present medications.                       - Await pathology results.                       - Repeat colonoscopy in 3 - 5 years for surveillance                        based on pathology  results. Procedure Code(s):    --- Professional ---                       [phone], Colonoscopy, flexible; with removal of tumor(s),                        polyp(s), or other lesion(s) by snare technique Diagnosis Code(s):    --- Professional ---                       Z86.010, Personal history of colonic polyps                       D12.2, Benign neoplasm of ascending colon                       D12.5, Benign neoplasm of sigmoid colon                       K62.1, Rectal polyp                       K64.0, First degree hemorrhoids CPT copyright 2016 American Medical Association. All rights reserved. The codes documented in this report are preliminary and upon coder review may  be revised to meet current compliance requirements. Jonathon Bellows, MD Jonathon Bellows MD, MD 07/24/2017 10:04:16 AM This report has been signed electronically. Number of Addenda: 0 Note Initiated On: 07/24/2017 9:23 AM Scope Withdrawal Time: 0 hours 19 minutes 50 seconds  Total Procedure Duration: 0 hours 23 minutes 58 seconds       [facility]

## 2017-07-24 NOTE — Anesthesia Preprocedure Evaluation (Signed)
Anesthesia Evaluation  Patient identified by MRN, date of birth, ID band Patient awake    Reviewed: Allergy & Precautions, NPO status , Patient's Chart, lab work & pertinent test results  Airway Mallampati: II  TM Distance: >3 FB     Dental  (+) Lower Dentures, Upper Dentures   Pulmonary former smoker,    Pulmonary exam normal        Cardiovascular hypertension, Pt. on medications Normal cardiovascular exam     Neuro/Psych CVA    GI/Hepatic Neg liver ROS, Colitis Hx   Endo/Other  diabetes, Well Controlled, Type 2, Oral Hypoglycemic Agents, Insulin Dependent  Renal/GU Renal InsufficiencyRenal disease     Musculoskeletal   Abdominal Normal abdominal exam  (+)   Peds  Hematology   Anesthesia Other Findings   Reproductive/Obstetrics                             Anesthesia Physical Anesthesia Plan  ASA: III  Anesthesia Plan: General   Post-op Pain Management:    Induction: Intravenous  PONV Risk Score and Plan:   Airway Management Planned: Nasal Cannula  Additional Equipment:   Intra-op Plan:   Post-operative Plan:   Informed Consent: I have reviewed the patients History and Physical, chart, labs and discussed the procedure including the risks, benefits and alternatives for the proposed anesthesia with the patient or authorized representative who has indicated his/her understanding and acceptance.   Dental advisory given  Plan Discussed with: CRNA and Surgeon  Anesthesia Plan Comments:         Anesthesia Quick Evaluation

## 2017-07-24 NOTE — OR Nursing (Signed)
C/O LEFT SHOULDER PAIN. DR Hendricks Limes.

## 2017-07-24 NOTE — Anesthesia Procedure Notes (Signed)
Performed by: COOK-MARTIN, Tyria Springer Pre-anesthesia Checklist: Patient identified, Emergency Drugs available, Suction available, Patient being monitored and Timeout performed Patient Re-evaluated:Patient Re-evaluated prior to induction Oxygen Delivery Method: Nasal cannula Preoxygenation: Pre-oxygenation with 100% oxygen Induction Type: IV induction Ventilation: Oral airway inserted - appropriate to patient size Placement Confirmation: positive ETCO2 and CO2 detector       

## 2017-07-25 ENCOUNTER — Encounter: Payer: Self-pay | Admitting: Gastroenterology

## 2017-07-26 ENCOUNTER — Encounter: Payer: Self-pay | Admitting: Gastroenterology

## 2017-07-26 LAB — SURGICAL PATHOLOGY

## 2017-07-27 ENCOUNTER — Other Ambulatory Visit: Payer: Self-pay | Admitting: Family Medicine

## 2017-08-13 ENCOUNTER — Encounter: Payer: Self-pay | Admitting: Family Medicine

## 2017-08-13 ENCOUNTER — Ambulatory Visit (INDEPENDENT_AMBULATORY_CARE_PROVIDER_SITE_OTHER): Payer: Medicare Other | Admitting: Family Medicine

## 2017-08-13 VITALS — BP 167/77 | HR 74 | Temp 98.6°F | Wt 183.2 lb

## 2017-08-13 DIAGNOSIS — M7122 Synovial cyst of popliteal space [Baker], left knee: Secondary | ICD-10-CM | POA: Diagnosis not present

## 2017-08-13 DIAGNOSIS — E1165 Type 2 diabetes mellitus with hyperglycemia: Secondary | ICD-10-CM

## 2017-08-13 DIAGNOSIS — E1140 Type 2 diabetes mellitus with diabetic neuropathy, unspecified: Secondary | ICD-10-CM | POA: Diagnosis not present

## 2017-08-13 DIAGNOSIS — IMO0002 Reserved for concepts with insufficient information to code with codable children: Secondary | ICD-10-CM

## 2017-08-13 LAB — BAYER DCA HB A1C WAIVED: HB A1C (BAYER DCA - WAIVED): 9.7 % — ABNORMAL HIGH (ref <7.0)

## 2017-08-13 MED ORDER — INSULIN NPH ISOPHANE & REGULAR (70-30) 100 UNIT/ML ~~LOC~~ SUSP: 17.0000 [IU] | Freq: Two times a day (BID) | SUBCUTANEOUS | 6 refills | Status: DC

## 2017-08-13 NOTE — Assessment & Plan Note (Signed)
Will get her back into see her orthopedist. Appointment set up for her today.

## 2017-08-13 NOTE — Patient Instructions (Addendum)
17 units of the novalin 2x a day  Appointment with Dr. Mack Guise 1:30 Tuesday 08/14/17 [address], [place], [place] [address]  Phone: [phone]

## 2017-08-13 NOTE — Progress Notes (Signed)
BP (!) 167/77 (BP Location: Left Arm, Patient Position: Sitting, Cuff Size: Normal)   Pulse 74   Temp 98.6 F (37 C)   Wt 183 lb 4 oz (83.1 kg)   SpO2 98%   BMI 30.49 kg/m    Subjective:    Patient ID: Brittany Sherman, female    DOB: 1946-05-15, 71 y.o.   MRN: [id number]  HPI: Brittany Sherman is a 71 y.o. female  Chief Complaint  Patient presents with  . Diabetes  . Leg Pain    left   DIABETES Hypoglycemic episodes:no Polydipsia/polyuria: yes Visual disturbance: no Chest pain: no Paresthesias: no Glucose Monitoring: yes  Accucheck frequency: Daily  Fasting glucose: has been up and down- 93 up to 191 Taking Insulin?: yes  Long acting insulin:  Short acting insulin: 15 untis 2x a day Blood Pressure Monitoring: not checking Retinal Examination: Up to Date Foot Exam: Up to Date Diabetic Education: Completed Pneumovax: Up to Date Influenza: Up to Date Aspirin: yes  LEG CRAMPS- went to the ER and was diagnosed with a Baker's cyst Duration: about a year Pain: yes Severity: moderate  Quality:  cramping Location:  calves Bilateral:  yes Onset: sudden Frequency: constant Time of  day:   constant Sudden unintentional leg jerking:   no Paresthesias:   no Decreased sensation:  no Weakness:   no Insomnia:   no Fatigue:   yes Status: stable   Relevant past medical, surgical, family and social history reviewed and updated as indicated. Interim medical history since our last visit reviewed. Allergies and medications reviewed and updated.  Review of Systems  Respiratory: Negative.   Cardiovascular: Positive for leg swelling. Negative for chest pain and palpitations.  Musculoskeletal: Positive for arthralgias and myalgias. Negative for back pain, gait problem, joint swelling, neck pain and neck stiffness.  Psychiatric/Behavioral: Negative.     Per HPI unless specifically indicated above     Objective:    BP (!) 167/77 (BP Location: Left Arm, Patient  Position: Sitting, Cuff Size: Normal)   Pulse 74   Temp 98.6 F (37 C)   Wt 183 lb 4 oz (83.1 kg)   SpO2 98%   BMI 30.49 kg/m   Wt Readings from Last 3 Encounters:  08/13/17 183 lb 4 oz (83.1 kg)  07/24/17 180 lb (81.6 kg)  07/10/17 179 lb (81.2 kg)    Physical Exam  Constitutional: She is oriented to person, place, and time. She appears well-developed and well-nourished. No distress.  HENT:  Head: Normocephalic and atraumatic.  Right Ear: Hearing normal.  Left Ear: Hearing normal.  Nose: Nose normal.  Eyes: Conjunctivae and lids are normal. Right eye exhibits no discharge. Left eye exhibits no discharge. No scleral icterus.  Cardiovascular: Normal rate, regular rhythm, normal heart sounds and intact distal pulses.  Exam reveals no gallop and no friction rub.   No murmur heard. Pulmonary/Chest: Effort normal and breath sounds normal. No respiratory distress. She has no wheezes. She has no rales. She exhibits no tenderness.  Musculoskeletal: She exhibits edema (Baker's cyst behind L knee, trace edema, mild tenderness to palpation. ) and tenderness. She exhibits no deformity.  Neurological: She is alert and oriented to person, place, and time.  Skin: Skin is warm, dry and intact. No rash noted. She is not diaphoretic. No erythema. No pallor.  Psychiatric: She has a normal mood and affect. Her speech is normal and behavior is normal. Judgment and thought content normal. Cognition and memory are normal.  Nursing note and vitals reviewed.      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 A1c still 9.7- will increase novalin to 17 units BID and call in 1 week- will follow up to titrate up in 1 month.       Relevant Medications   insulin NPH-regular Human (NOVOLIN 70/30) (70-30) 100 UNIT/ML injection   Other Relevant Orders   Bayer DCA Hb A1c Waived      Musculoskeletal and Integument   Baker's cyst of knee, left    Will get her back into see her orthopedist. Appointment set up for her today.          Follow up plan: Return in about 4 weeks (around 09/10/2017).

## 2017-08-13 NOTE — Assessment & Plan Note (Signed)
Not under good control. A1c still 9.7- will increase novalin to 17 units BID and call in 1 week- will follow up to titrate up in 1 month.

## 2017-08-21 ENCOUNTER — Telehealth: Payer: Self-pay | Admitting: Family Medicine

## 2017-08-21 MED ORDER — HYDROCHLOROTHIAZIDE 25 MG PO TABS: 25.0000 mg | ORAL_TABLET | Freq: Every day | ORAL | 1 refills | Status: DC

## 2017-08-21 NOTE — Telephone Encounter (Signed)
-----   Message from Valerie Roys, [place] sent at 08/13/2017 10:02 AM EDT ----- Call about her sugars

## 2017-08-21 NOTE — Telephone Encounter (Signed)
Refill on HCTZ 25mg  from [facility].

## 2017-08-21 NOTE — Telephone Encounter (Signed)
Called to check in and see how her sugars are. Not able to leave a message. No answer when we called. Will try again later.

## 2017-08-22 NOTE — Telephone Encounter (Signed)
Called again to check in on her sugars. Unable to leave a message. Will try again later. If she calls in- please check to see what her sugars are and confirm this is a good number.

## 2017-08-27 NOTE — Telephone Encounter (Signed)
Called to check in on her sugars. She notes that they have been running about 150. She notes that she has been running back and forth to the hospital as her sister has been in intensive care. She notes that some days her sugars get out of whack. Does not want to change her dose now. We are following up with her on 09/11/17

## 2017-09-11 ENCOUNTER — Ambulatory Visit: Payer: Medicare Other | Admitting: Family Medicine

## 2017-09-24 ENCOUNTER — Ambulatory Visit: Payer: Medicare Other | Admitting: Family Medicine

## 2017-09-27 ENCOUNTER — Ambulatory Visit (INDEPENDENT_AMBULATORY_CARE_PROVIDER_SITE_OTHER): Payer: Medicare Other | Admitting: Family Medicine

## 2017-09-27 ENCOUNTER — Encounter: Payer: Self-pay | Admitting: Family Medicine

## 2017-09-27 VITALS — BP 138/60 | HR 78 | Temp 98.8°F | Wt 191.2 lb

## 2017-09-27 DIAGNOSIS — Z23 Encounter for immunization: Secondary | ICD-10-CM

## 2017-09-27 DIAGNOSIS — E1140 Type 2 diabetes mellitus with diabetic neuropathy, unspecified: Secondary | ICD-10-CM

## 2017-09-27 DIAGNOSIS — IMO0002 Reserved for concepts with insufficient information to code with codable children: Secondary | ICD-10-CM

## 2017-09-27 DIAGNOSIS — E1165 Type 2 diabetes mellitus with hyperglycemia: Secondary | ICD-10-CM

## 2017-09-27 MED ORDER — INSULIN NPH ISOPHANE & REGULAR (70-30) 100 UNIT/ML ~~LOC~~ SUSP: 20.0000 [IU] | Freq: Two times a day (BID) | SUBCUTANEOUS | 6 refills | Status: DC

## 2017-09-27 MED ORDER — QUINAPRIL-HYDROCHLOROTHIAZIDE 20-25 MG PO TABS: 1.0000 | ORAL_TABLET | Freq: Every day | ORAL | 1 refills | Status: DC

## 2017-09-27 NOTE — Progress Notes (Signed)
BP 138/60   Pulse 78   Temp 98.8 F (37.1 C)   Wt 191 lb 3.2 oz (86.7 kg)   SpO2 97%   BMI 31.82 kg/m    Subjective:    Patient ID: Brittany Sherman, female    DOB: 14-Nov-1946, 71 y.o.   MRN: [id number]  HPI: Brittany Sherman is a 71 y.o. female  Chief Complaint  Patient presents with  . Diabetes   DIABETES Hypoglycemic episodes:no Polydipsia/polyuria: yes Visual disturbance: no Chest pain: no Paresthesias: no Glucose Monitoring: yes  Accucheck frequency: 1x a week  Fasting glucose: 238 Taking Insulin?: no Blood Pressure Monitoring: not checking Retinal Examination: Up to Date Foot Exam: Up to Date Diabetic Education: Completed Pneumovax: Up to Date Influenza: Given today Aspirin: yes  Relevant past medical, surgical, family and social history reviewed and updated as indicated. Interim medical history since our last visit reviewed. Allergies and medications reviewed and updated.  Review of Systems  Constitutional: Negative.   Respiratory: Negative.   Cardiovascular: Negative.   Musculoskeletal: Negative.   Psychiatric/Behavioral: Negative.     Per HPI unless specifically indicated above     Objective:    BP 138/60   Pulse 78   Temp 98.8 F (37.1 C)   Wt 191 lb 3.2 oz (86.7 kg)   SpO2 97%   BMI 31.82 kg/m   Wt Readings from Last 3 Encounters:  09/27/17 191 lb 3.2 oz (86.7 kg)  08/13/17 183 lb 4 oz (83.1 kg)  07/24/17 180 lb (81.6 kg)    Physical Exam  Constitutional: She is oriented to person, place, and time. She appears well-developed and well-nourished. No distress.  HENT:  Head: Normocephalic and atraumatic.  Right Ear: Hearing normal.  Left Ear: Hearing normal.  Nose: Nose normal.  Eyes: Conjunctivae and lids are normal. Right eye exhibits no discharge. Left eye exhibits no discharge. No scleral icterus.  Cardiovascular: Normal rate, regular rhythm, normal heart sounds and intact distal pulses.  Exam reveals no gallop and no friction rub.    No murmur heard. Pulmonary/Chest: Effort normal and breath sounds normal. No respiratory distress. She has no wheezes. She has no rales. She exhibits no tenderness.  Musculoskeletal: Normal range of motion.  Neurological: She is alert and oriented to person, place, and time.  Skin: Skin is warm, dry and intact. No rash noted. She is not diaphoretic. No erythema. No pallor.  Psychiatric: She has a normal mood and affect. Her speech is normal and behavior is normal. Judgment and thought content normal. Cognition and memory are normal.  Nursing note and vitals reviewed.   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 Increase novalin to 20 units BID and recheck with A1c in about 6 weeks. Call with any concerns.       Relevant Medications   quinapril-hydrochlorothiazide (ACCURETIC) 20-25 MG tablet   insulin NPH-regular Human (NOVOLIN 70/30) (70-30) 100 UNIT/ML injection   Other Relevant Orders   Basic metabolic panel    Other Visit Diagnoses    Need for influenza vaccination       Flu shot given today.  Relevant Orders   Flu vaccine HIGH DOSE PF (Fluzone High dose) (Completed)       Follow up plan: Return 4-6 weeks, for DM Visit.

## 2017-09-27 NOTE — Patient Instructions (Signed)

## 2017-09-27 NOTE — Assessment & Plan Note (Signed)
Still not under good control. Increase novalin to 20 units BID and recheck with A1c in about 6 weeks. Call with any concerns.

## 2017-09-28 LAB — BASIC METABOLIC PANEL
BUN/Creatinine Ratio: 16 (ref 12–28)
BUN: 16 mg/dL (ref 8–27)
CHLORIDE: 99 mmol/L (ref 96–106)
CO2: 23 mmol/L (ref 20–29)
CREATININE: 1 mg/dL (ref 0.57–1.00)
Calcium: 9.5 mg/dL (ref 8.7–10.3)
GFR calc Af Amer: 65 mL/min/{1.73_m2} (ref >59)
GFR calc non Af Amer: 57 mL/min/{1.73_m2} — ABNORMAL LOW (ref >59)
GLUCOSE: 243 mg/dL — AB (ref 65–99)
POTASSIUM: 4.5 mmol/L (ref 3.5–5.2)
SODIUM: 139 mmol/L (ref 134–144)

## 2017-10-01 ENCOUNTER — Encounter: Payer: Self-pay | Admitting: Family Medicine

## 2017-10-11 ENCOUNTER — Telehealth: Payer: Self-pay | Admitting: Family Medicine

## 2017-10-11 NOTE — Telephone Encounter (Signed)
Copied from [place] [id number]. Topic: Quick Communication - See Telephone Encounter >> Oct 11, 2017  4:03 PM Arletha Grippe wrote: CRM for notification. See Telephone encounter for:  10/11/17. Pt needs refill diabetic strips - true track is brnad name,  pharmacy is [facility]  [address]  Pt is out of strips  cb number is [phone]

## 2017-10-11 NOTE — Telephone Encounter (Signed)
Please prep paper and I'll be happy to sign. Thanks!

## 2017-10-19 ENCOUNTER — Encounter: Payer: Self-pay | Admitting: Family Medicine

## 2017-11-05 ENCOUNTER — Telehealth: Payer: Self-pay | Admitting: Family Medicine

## 2017-11-05 ENCOUNTER — Other Ambulatory Visit: Payer: Self-pay | Admitting: Family Medicine

## 2017-11-05 NOTE — Telephone Encounter (Signed)
Overdue for follow up. Needs follow up on her diabetes.

## 2017-11-07 NOTE — Telephone Encounter (Signed)
Called to schedule an appt. No answer and no vm.

## 2017-11-13 ENCOUNTER — Ambulatory Visit: Payer: Medicare Other | Admitting: Family Medicine

## 2017-11-26 ENCOUNTER — Ambulatory Visit: Payer: Medicare Other | Admitting: Family Medicine

## 2018-01-12 ENCOUNTER — Other Ambulatory Visit: Payer: Self-pay | Admitting: Family Medicine

## 2018-01-14 NOTE — Telephone Encounter (Signed)
Pt. Scheduled for 07/04/18 for medicare wellness visit.

## 2018-01-14 NOTE — Telephone Encounter (Signed)
Needs follow up appointment ASAP

## 2018-01-14 NOTE — Telephone Encounter (Signed)
Quinapril refill request Last OV 09/27/17;  Visit on 11/27 and 12/10 were cancelled.  There is a note that pt needs a follow up appt for her diabetes however next appt is 07/04/18.    Pharmacy is [facility], [place]-[address] Pt of Dr. Wynetta Emery

## 2018-01-16 NOTE — Telephone Encounter (Signed)
Patient made an appt for Friday 01/18/18 for her follow up, but she wants to know if she can get her medication filled up until her appt date.

## 2018-01-16 NOTE — Telephone Encounter (Signed)
Routing to provider  

## 2018-01-18 ENCOUNTER — Encounter: Payer: Self-pay | Admitting: Family Medicine

## 2018-01-18 ENCOUNTER — Ambulatory Visit: Payer: Medicare Other | Admitting: Family Medicine

## 2018-01-18 VITALS — BP 155/72 | HR 84 | Temp 97.5°F | Wt 186.0 lb

## 2018-01-18 DIAGNOSIS — E1140 Type 2 diabetes mellitus with diabetic neuropathy, unspecified: Secondary | ICD-10-CM | POA: Diagnosis not present

## 2018-01-18 DIAGNOSIS — E559 Vitamin D deficiency, unspecified: Secondary | ICD-10-CM

## 2018-01-18 DIAGNOSIS — I129 Hypertensive chronic kidney disease with stage 1 through stage 4 chronic kidney disease, or unspecified chronic kidney disease: Secondary | ICD-10-CM

## 2018-01-18 DIAGNOSIS — E1165 Type 2 diabetes mellitus with hyperglycemia: Secondary | ICD-10-CM | POA: Diagnosis not present

## 2018-01-18 DIAGNOSIS — N183 Chronic kidney disease, stage 3 unspecified: Secondary | ICD-10-CM

## 2018-01-18 DIAGNOSIS — E1169 Type 2 diabetes mellitus with other specified complication: Secondary | ICD-10-CM | POA: Diagnosis not present

## 2018-01-18 DIAGNOSIS — E785 Hyperlipidemia, unspecified: Secondary | ICD-10-CM

## 2018-01-18 DIAGNOSIS — IMO0002 Reserved for concepts with insufficient information to code with codable children: Secondary | ICD-10-CM

## 2018-01-18 MED ORDER — INSULIN DEGLUDEC 100 UNIT/ML ~~LOC~~ SOPN: 20.0000 [IU] | PEN_INJECTOR | Freq: Every day | SUBCUTANEOUS | 3 refills | Status: DC

## 2018-01-18 MED ORDER — ATORVASTATIN CALCIUM 40 MG PO TABS: 40.0000 mg | ORAL_TABLET | Freq: Every morning | ORAL | 1 refills | Status: DC

## 2018-01-18 MED ORDER — AMLODIPINE BESYLATE 5 MG PO TABS: 5.0000 mg | ORAL_TABLET | Freq: Every day | ORAL | 3 refills | Status: DC

## 2018-01-18 MED ORDER — QUINAPRIL-HYDROCHLOROTHIAZIDE 20-25 MG PO TABS: 1.0000 | ORAL_TABLET | Freq: Every day | ORAL | 1 refills | Status: DC

## 2018-01-18 NOTE — Assessment & Plan Note (Signed)
Rechecking levels today. Await results.  

## 2018-01-18 NOTE — Progress Notes (Signed)
BP (!) 155/72 (BP Location: Left Arm, Patient Position: Sitting, Cuff Size: Normal)   Pulse 84   Temp (!) 97.5 F (36.4 C)   Wt 186 lb (84.4 kg)   SpO2 97%   BMI 30.95 kg/m    Subjective:    Patient ID: Brittany Sherman, female    DOB: February 03, 1946, 72 y.o.   MRN: [id number]  HPI: Brittany Sherman is a 72 y.o. female  Chief Complaint  Patient presents with  . Diabetes  . Hypertension  . Hyperlipidemia   Has been having aching in her L side for about a month  DIABETES Hypoglycemic episodes:yes Polydipsia/polyuria: no Visual disturbance: no Chest pain: no Paresthesias: no Glucose Monitoring: yes  Accucheck frequency: Every other day  Fasting glucose: 125-180 Taking Insulin?: yes Blood Pressure Monitoring: not checking Retinal Examination: Not up to Date Foot Exam: Up to Date Diabetic Education: Completed Pneumovax: Up to Date Influenza: Up to Date Aspirin: no  HYPERTENSION / [place] Satisfied with current treatment? yes Duration of hypertension: chronic BP monitoring frequency: not checking BP medication side effects: no Past BP meds: quinapril-hctz Duration of hyperlipidemia: chronic Cholesterol medication side effects: no Cholesterol supplements: none Past cholesterol medications: atorvastatin Medication compliance: fair compliance Aspirin: no Recent stressors: no Recurrent headaches: no Visual changes: no Palpitations: no Dyspnea: no Chest pain: no Lower extremity edema: no Dizzy/lightheaded: no   Relevant past medical, surgical, family and social history reviewed and updated as indicated. Interim medical history since our last visit reviewed. Allergies and medications reviewed and updated.  Review of Systems  Constitutional: Negative.   Respiratory: Negative.   Cardiovascular: Negative.   Musculoskeletal: Positive for arthralgias and myalgias. Negative for back pain, gait problem, joint swelling, neck pain and neck stiffness.  Skin:  Negative.   Neurological: Negative.   Psychiatric/Behavioral: Negative.     Per HPI unless specifically indicated above     Objective:    BP (!) 155/72 (BP Location: Left Arm, Patient Position: Sitting, Cuff Size: Normal)   Pulse 84   Temp (!) 97.5 F (36.4 C)   Wt 186 lb (84.4 kg)   SpO2 97%   BMI 30.95 kg/m   Wt Readings from Last 3 Encounters:  01/18/18 186 lb (84.4 kg)  09/27/17 191 lb 3.2 oz (86.7 kg)  08/13/17 183 lb 4 oz (83.1 kg)    Physical Exam  Constitutional: She is oriented to person, place, and time. She appears well-developed and well-nourished. No distress.  HENT:  Head: Normocephalic and atraumatic.  Right Ear: Hearing normal.  Left Ear: Hearing normal.  Nose: Nose normal.  Eyes: Conjunctivae and lids are normal. Right eye exhibits no discharge. Left eye exhibits no discharge. No scleral icterus.  Cardiovascular: Normal rate, regular rhythm, normal heart sounds and intact distal pulses. Exam reveals no gallop and no friction rub.  No murmur heard. Pulmonary/Chest: Effort normal and breath sounds normal. No respiratory distress. She has no wheezes. She has no rales. She exhibits no tenderness.  Musculoskeletal: Normal range of motion.  Neurological: She is alert and oriented to person, place, and time.  Skin: Skin is warm, dry and intact. No rash noted. She is not diaphoretic. No erythema. No pallor.  Psychiatric: She has a normal mood and affect. Her speech is normal and behavior is normal. Judgment and thought content normal. Cognition and memory are normal.  Nursing note and vitals reviewed.   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 Will convert from her 40 units of 70/30 daily to 20 units of tresiba and recheck by phone next week and follow up in person in 2 weeks.       Relevant Medications   quinapril-hydrochlorothiazide (ACCURETIC) 20-25 MG tablet   atorvastatin (LIPITOR) 40 MG tablet   insulin degludec (TRESIBA FLEXTOUCH) 100 UNIT/ML SOPN FlexTouch Pen   Other Relevant Orders   Bayer DCA Hb A1c Waived   CBC with Differential/Platelet   Comprehensive metabolic panel   Microalbumin, Urine Waived   UA/M w/rflx Culture, Routine   Hyperlipidemia associated with type 2 diabetes mellitus (HCC)    Rechecking levels today. Continue current regimen. Continue to monitor. Call with any concerns.       Relevant Medications   amLODipine (NORVASC) 5 MG tablet   quinapril-hydrochlorothiazide (ACCURETIC) 20-25 MG tablet   atorvastatin (LIPITOR) 40 MG tablet   insulin degludec (TRESIBA FLEXTOUCH) 100 UNIT/ML SOPN FlexTouch Pen   Other Relevant Orders   CBC with Differential/Platelet   Comprehensive metabolic panel   Lipid Panel w/o Chol/HDL Ratio   UA/M w/rflx Culture, Routine     Genitourinary   Benign hypertensive renal disease    Not under good control on current regimen. Will add amlodipine and recheck 2 weeks.       Relevant Orders   CBC with Differential/Platelet   Comprehensive metabolic panel   Microalbumin, Urine Waived   UA/M w/rflx Culture, Routine   CKD (chronic kidney disease), stage III (HCC)    Rechecking levels today. Await results.       Relevant Orders   CBC with Differential/Platelet   Comprehensive metabolic panel   UA/M w/rflx Culture, Routine     Other    Vitamin D deficiency    Rechecking levels today. Await results.       Relevant Orders   CBC with Differential/Platelet   Comprehensive metabolic panel   UA/M w/rflx Culture, Routine   VITAMIN D 25 Hydroxy (Vit-D Deficiency, Fractures)       Follow up plan: Return in about 2 weeks (around 02/01/2018) for follow up BP and sugars.

## 2018-01-18 NOTE — Assessment & Plan Note (Signed)
Rechecking levels today. Continue current regimen. Continue to monitor. Call with any concerns.  

## 2018-01-18 NOTE — Assessment & Plan Note (Signed)
Not under good control on her 70/30 with a lot of ups and downs. Will convert from her 40 units of 70/30 daily to 20 units of tresiba and recheck by phone next week and follow up in person in 2 weeks.

## 2018-01-18 NOTE — Assessment & Plan Note (Signed)
Not under good control on current regimen. Will add amlodipine and recheck 2 weeks.

## 2018-01-19 LAB — UA/M W/RFLX CULTURE, ROUTINE
Bilirubin, UA: NEGATIVE
Leukocytes, UA: NEGATIVE
Nitrite, UA: NEGATIVE
PH UA: 7 (ref 5.0–7.5)
Specific Gravity, UA: 1.02 (ref 1.005–1.030)
Urobilinogen, Ur: 2 mg/dL — ABNORMAL HIGH (ref 0.2–1.0)

## 2018-01-19 LAB — BAYER DCA HB A1C WAIVED: HB A1C: 9.6 % — AB (ref <7.0)

## 2018-01-19 LAB — MICROALBUMIN, URINE WAIVED
CREATININE, URINE WAIVED: 200 mg/dL (ref 10–300)
Microalb, Ur Waived: 30 mg/L — ABNORMAL HIGH (ref 0–19)
Microalb/Creat Ratio: <30 mg/g (ref <30)

## 2018-01-19 LAB — MICROSCOPIC EXAMINATION: Bacteria, UA: NONE SEEN

## 2018-01-21 ENCOUNTER — Telehealth: Payer: Self-pay | Admitting: Family Medicine

## 2018-01-21 NOTE — Telephone Encounter (Signed)
She notes that her sugars are running about the same. Will leave her on current dose and check in in 1 week and adjust dose.   Results given over the phone.

## 2018-01-21 NOTE — Telephone Encounter (Signed)
-----   Message from Valerie Roys, DO sent at 01/18/2018  4:44 PM EST ----- Call about her sugars

## 2018-01-22 LAB — LIPID PANEL W/O CHOL/HDL RATIO

## 2018-01-22 LAB — CBC WITH DIFFERENTIAL/PLATELET
BASOS ABS: 0 10*3/uL (ref 0.0–0.2)
Basos: 0 %
EOS (ABSOLUTE): 0 10*3/uL (ref 0.0–0.4)
Eos: 1 %
HEMOGLOBIN: 13.4 g/dL (ref 11.1–15.9)
Hematocrit: 42.3 % (ref 34.0–46.6)
IMMATURE GRANS (ABS): 0 10*3/uL (ref 0.0–0.1)
Immature Granulocytes: 0 %
LYMPHS: 37 %
Lymphocytes Absolute: 2.2 10*3/uL (ref 0.7–3.1)
MCH: 26.1 pg — AB (ref 26.6–33.0)
MCHC: 31.7 g/dL (ref 31.5–35.7)
MCV: 82 fL (ref 79–97)
MONOCYTES: 8 %
Monocytes Absolute: 0.5 10*3/uL (ref 0.1–0.9)
NEUTROS ABS: 3.1 10*3/uL (ref 1.4–7.0)
Neutrophils: 54 %
Platelets: 275 10*3/uL (ref 150–379)
RBC: 5.14 x10E6/uL (ref 3.77–5.28)
RDW: 16.2 % — ABNORMAL HIGH (ref 12.3–15.4)
WBC: 5.8 10*3/uL (ref 3.4–10.8)

## 2018-01-22 LAB — COMPREHENSIVE METABOLIC PANEL

## 2018-01-22 LAB — VITAMIN D 25 HYDROXY (VIT D DEFICIENCY, FRACTURES)

## 2018-01-28 ENCOUNTER — Telehealth: Payer: Self-pay | Admitting: Family Medicine

## 2018-01-28 DIAGNOSIS — Z599 Problem related to housing and economic circumstances, unspecified: Secondary | ICD-10-CM

## 2018-01-28 DIAGNOSIS — Z598 Other problems related to housing and economic circumstances: Secondary | ICD-10-CM

## 2018-01-28 NOTE — Telephone Encounter (Signed)
Brittany Sherman was working better for her. Sugars doing much better. However, medicine is too expensive for her.  Will put in referral to [facility] to see if we can find out about long acting basal to get cheaper- was on 70/30, but was dropping her low and not controlling her sugars. Referral generated today. If she needs another sample, we will give it to her- please give her one if she calls.

## 2018-01-28 NOTE — Telephone Encounter (Signed)
-----   Message from Valerie Roys, [place] sent at 01/21/2018  2:02 PM EST ----- Call about sugars

## 2018-02-01 ENCOUNTER — Encounter: Payer: Self-pay | Admitting: Family Medicine

## 2018-02-01 ENCOUNTER — Ambulatory Visit: Payer: Medicare Other | Admitting: Family Medicine

## 2018-02-01 VITALS — BP 152/85 | HR 75 | Temp 97.8°F | Wt 183.2 lb

## 2018-02-01 DIAGNOSIS — E1140 Type 2 diabetes mellitus with diabetic neuropathy, unspecified: Secondary | ICD-10-CM | POA: Diagnosis not present

## 2018-02-01 DIAGNOSIS — E1165 Type 2 diabetes mellitus with hyperglycemia: Secondary | ICD-10-CM

## 2018-02-01 DIAGNOSIS — IMO0002 Reserved for concepts with insufficient information to code with codable children: Secondary | ICD-10-CM

## 2018-02-01 DIAGNOSIS — I129 Hypertensive chronic kidney disease with stage 1 through stage 4 chronic kidney disease, or unspecified chronic kidney disease: Secondary | ICD-10-CM

## 2018-02-01 MED ORDER — AMLODIPINE BESYLATE 5 MG PO TABS: 5.0000 mg | ORAL_TABLET | Freq: Every day | ORAL | 3 refills | Status: DC

## 2018-02-01 MED ORDER — GLIMEPIRIDE 4 MG PO TABS: 4.0000 mg | ORAL_TABLET | Freq: Two times a day (BID) | ORAL | 1 refills | Status: DC

## 2018-02-01 NOTE — Assessment & Plan Note (Signed)
Has not been taking her amlodipine. Confirmed that it will cost her $4. She will go pick it up. Rechecking BMP today. Recheck BP in 1 month.

## 2018-02-01 NOTE — Progress Notes (Signed)
BP (!) 152/85 (BP Location: Left Arm, Patient Position: Sitting, Cuff Size: Large)   Pulse 75   Temp 97.8 F (36.6 C)   Wt 183 lb 3 oz (83.1 kg)   SpO2 98%   BMI 30.48 kg/m    Subjective:    Patient ID: Brittany Sherman, female    DOB: 1946/02/06, 72 y.o.   MRN: [id number]  HPI: Brittany Sherman is a 72 y.o. female  Chief Complaint  Patient presents with  . Diabetes  . Hypertension   HYPERTENSION- has not been taking her amlodipine. Got confused on her medication. States that it was too much money Hypertension status: uncontrolled  Satisfied with current treatment? no Duration of hypertension: chronic BP monitoring frequency:  not checking BP medication side effects:  no Medication compliance: fair compliance Previous BP meds:quinapril-hctz, amlodipine Aspirin: no Recurrent headaches: no Visual changes: no Palpitations: no Dyspnea: no Chest pain: no Lower extremity edema: no Dizzy/lightheaded: no  DIABETES Hypoglycemic episodes:no Polydipsia/polyuria: yes Visual disturbance: no Chest pain: no Paresthesias: no Glucose Monitoring: yes  Accucheck frequency: occasionally  Fasting glucose: 108, 113 another day- doing good Taking Insulin?: yes  Long acting insulin: tresiba 20 units daily Blood Pressure Monitoring: not checking Retinal Examination: Not up to Date Foot Exam: Up to Date Diabetic Education: Completed Pneumovax: Up to Date Influenza: Up to Date Aspirin: no  Relevant past medical, surgical, family and social history reviewed and updated as indicated. Interim medical history since our last visit reviewed. Allergies and medications reviewed and updated.  Review of Systems  Constitutional: Negative.   Respiratory: Negative.   Cardiovascular: Negative.   Neurological: Negative.   Psychiatric/Behavioral: Negative.     Per HPI unless specifically indicated above     Objective:    BP (!) 152/85 (BP Location: Left Arm, Patient Position: Sitting,  Cuff Size: Large)   Pulse 75   Temp 97.8 F (36.6 C)   Wt 183 lb 3 oz (83.1 kg)   SpO2 98%   BMI 30.48 kg/m   Wt Readings from Last 3 Encounters:  02/01/18 183 lb 3 oz (83.1 kg)  01/18/18 186 lb (84.4 kg)  09/27/17 191 lb 3.2 oz (86.7 kg)    Physical Exam  Constitutional: She is oriented to person, place, and time. She appears well-developed and well-nourished. No distress.  HENT:  Head: Normocephalic and atraumatic.  Right Ear: Hearing normal.  Left Ear: Hearing normal.  Nose: Nose normal.  Eyes: Conjunctivae and lids are normal. Right eye exhibits no discharge. Left eye exhibits no discharge. No scleral icterus.  Cardiovascular: Normal rate, regular rhythm, normal heart sounds and intact distal pulses. Exam reveals no gallop and no friction rub.  No murmur heard. Pulmonary/Chest: Effort normal and breath sounds normal. No respiratory distress. She has no wheezes. She has no rales. She exhibits no tenderness.  Musculoskeletal: Normal range of motion.  Neurological: She is alert and oriented to person, place, and time.  Skin: Skin is warm, dry and intact. No rash noted. She is not diaphoretic. No erythema. No pallor.  Psychiatric: She has a normal mood and affect. Her speech is normal and behavior is normal. Judgment and thought content normal. Cognition and memory are normal.  Nursing note and vitals reviewed.   Results for orders placed or performed in visit on 01/18/18  Microscopic Examination  Result Value Ref Range   WBC, UA 0-5 0 - 5 /hpf   RBC, UA 0-2 0 - 2 /hpf   Epithelial Cells (non  renal) 0-10 0 - 10 /hpf   Bacteria, UA None seen None seen/Few  Bayer DCA Hb A1c Waived  Result Value Ref Range   Bayer DCA Hb A1c Waived 9.6 (H) <7.0 %  CBC with Differential/Platelet  Result Value Ref Range   WBC 5.8 3.4 - 10.8 x10E3/uL   RBC 5.14 3.77 - 5.28 x10E6/uL   Hemoglobin 13.4 11.1 - 15.9 g/dL   Hematocrit 42.3 34.0 - 46.6 %   MCV 82 79 - 97 fL   MCH 26.1 (L) 26.6 -  33.0 pg   MCHC 31.7 31.5 - 35.7 g/dL   RDW 16.2 (H) 12.3 - 15.4 %   Platelets 275 150 - 379 x10E3/uL   Neutrophils 54 Not Estab. %   Lymphs 37 Not Estab. %   Monocytes 8 Not Estab. %   Eos 1 Not Estab. %   Basos 0 Not Estab. %   Neutrophils Absolute 3.1 1.4 - 7.0 x10E3/uL   Lymphocytes Absolute 2.2 0.7 - 3.1 x10E3/uL   Monocytes Absolute 0.5 0.1 - 0.9 x10E3/uL   EOS (ABSOLUTE) 0.0 0.0 - 0.4 x10E3/uL   Basophils Absolute 0.0 0.0 - 0.2 x10E3/uL   Immature Granulocytes 0 Not Estab. %   Immature Grans (Abs) 0.0 0.0 - 0.1 x10E3/uL  Comprehensive metabolic panel  Result Value Ref Range   Glucose CANCELED mg/dL   BUN CANCELED    Creatinine, Ser CANCELED    Sodium CANCELED    Potassium CANCELED    Chloride CANCELED    CO2 CANCELED    Calcium CANCELED    Total Protein CANCELED    Albumin CANCELED    Bilirubin Total CANCELED    Alkaline Phosphatase CANCELED    AST CANCELED    ALT CANCELED   Lipid Panel w/o Chol/HDL Ratio  Result Value Ref Range   Cholesterol, Total CANCELED mg/dL   Triglycerides CANCELED    HDL CANCELED   Microalbumin, Urine Waived  Result Value Ref Range   Microalb, Ur Waived 30 (H) 0 - 19 mg/L   Creatinine, Urine Waived 200 10 - 300 mg/dL   Microalb/Creat Ratio <30 <30 mg/g  UA/M w/rflx Culture, Routine  Result Value Ref Range   Specific Gravity, UA 1.020 1.005 - 1.030   pH, UA 7.0 5.0 - 7.5   Color, UA Yellow Yellow   Appearance Ur Hazy (A) Clear   Leukocytes, UA Negative Negative   Protein, UA Trace (A) Negative/Trace   Glucose, UA 2+ (A) Negative   Ketones, UA Trace (A) Negative   RBC, UA Trace (A) Negative   Bilirubin, UA Negative Negative   Urobilinogen, Ur 2.0 (H) 0.2 - 1.0 mg/dL   Nitrite, UA Negative Negative   Microscopic Examination See below:   VITAMIN D 25 Hydroxy (Vit-D Deficiency, Fractures)  Result Value Ref Range   Vit D, 25-Hydroxy CANCELED ng/mL      Assessment & Plan:   Problem List Items Addressed This Visit      Endocrine    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 Expensive. We have referral for [facility] in to see if we can help with the cost of her insulin. Sample given today. Call with any concerns.       Relevant Medications   glimepiride (AMARYL) 4 MG tablet   Other Relevant Orders   Basic metabolic panel     Genitourinary   Benign hypertensive renal disease    Has not been taking her  amlodipine. Confirmed that it will cost her $4. She will go pick it up. Rechecking BMP today. Recheck BP in 1 month.           Follow up plan: Return in about 4 weeks (around 03/01/2018) for follow up BP and sugars.

## 2018-02-01 NOTE — Assessment & Plan Note (Signed)
Doing well on her tresiba. Expensive. We have referral for [facility] in to see if we can help with the cost of her insulin. Sample given today. Call with any concerns.

## 2018-02-02 LAB — BASIC METABOLIC PANEL
BUN/Creatinine Ratio: 22 (ref 12–28)
BUN: 18 mg/dL (ref 8–27)
CALCIUM: 9.7 mg/dL (ref 8.7–10.3)
CO2: 24 mmol/L (ref 20–29)
Chloride: 105 mmol/L (ref 96–106)
Creatinine, Ser: 0.82 mg/dL (ref 0.57–1.00)
GFR calc Af Amer: 83 mL/min/{1.73_m2} (ref >59)
GFR calc non Af Amer: 72 mL/min/{1.73_m2} (ref >59)
Glucose: 119 mg/dL — ABNORMAL HIGH (ref 65–99)
POTASSIUM: 4.3 mmol/L (ref 3.5–5.2)
Sodium: 144 mmol/L (ref 134–144)

## 2018-02-04 ENCOUNTER — Encounter: Payer: Self-pay | Admitting: Family Medicine

## 2018-03-01 ENCOUNTER — Ambulatory Visit: Payer: Medicare Other | Admitting: Family Medicine

## 2018-03-01 ENCOUNTER — Encounter: Payer: Self-pay | Admitting: Family Medicine

## 2018-03-01 VITALS — BP 135/80 | HR 79 | Temp 97.8°F | Wt 181.8 lb

## 2018-03-01 DIAGNOSIS — E1165 Type 2 diabetes mellitus with hyperglycemia: Secondary | ICD-10-CM

## 2018-03-01 DIAGNOSIS — I129 Hypertensive chronic kidney disease with stage 1 through stage 4 chronic kidney disease, or unspecified chronic kidney disease: Secondary | ICD-10-CM | POA: Diagnosis not present

## 2018-03-01 DIAGNOSIS — E1140 Type 2 diabetes mellitus with diabetic neuropathy, unspecified: Secondary | ICD-10-CM

## 2018-03-01 DIAGNOSIS — IMO0002 Reserved for concepts with insufficient information to code with codable children: Secondary | ICD-10-CM

## 2018-03-01 MED ORDER — PEN NEEDLES 32G X 6 MM MISC: 1.0000 | Freq: Every day | 12 refills | Status: DC

## 2018-03-01 NOTE — Progress Notes (Signed)
BP 135/80   Pulse 79   Temp 97.8 F (36.6 C) (Oral)   Wt 181 lb 12.8 oz (82.5 kg)   SpO2 97%   BMI 30.25 kg/m    Subjective:    Patient ID: Brittany Sherman, female    DOB: 1946/10/07, 72 y.o.   MRN: [id number]  HPI: Brittany Sherman is a 72 y.o. female  Chief Complaint  Patient presents with  . Diabetes  . Hypertension   HYPERTENSION Hypertension status: better  Satisfied with current treatment? yes Duration of hypertension: chronic BP monitoring frequency:  not checking BP medication side effects:  yes Medication compliance: excellent compliance Aspirin: yes Recurrent headaches: no Visual changes: no Palpitations: no Dyspnea: no Chest pain: no Lower extremity edema: no Dizzy/lightheaded: no  DIABETES Hypoglycemic episodes:no Polydipsia/polyuria: no Visual disturbance: no Chest pain: no Paresthesias: no Glucose Monitoring: yes- every 2-3 days, very fluctuating Taking Insulin?: yes Blood Pressure Monitoring: not checking Retinal Examination: Not up to Date Foot Exam: Up to Date Diabetic Education: Not Completed Pneumovax: Up to Date Influenza: Up to Date Aspirin: yes  Relevant past medical, surgical, family and social history reviewed and updated as indicated. Interim medical history since our last visit reviewed. Allergies and medications reviewed and updated.  Review of Systems  Constitutional: Negative.   Respiratory: Negative.   Cardiovascular: Negative.   Psychiatric/Behavioral: Negative.     Per HPI unless specifically indicated above     Objective:    BP 135/80   Pulse 79   Temp 97.8 F (36.6 C) (Oral)   Wt 181 lb 12.8 oz (82.5 kg)   SpO2 97%   BMI 30.25 kg/m   Wt Readings from Last 3 Encounters:  03/01/18 181 lb 12.8 oz (82.5 kg)  02/01/18 183 lb 3 oz (83.1 kg)  01/18/18 186 lb (84.4 kg)    Physical Exam  Constitutional: She is oriented to person, place, and time. She appears well-developed and well-nourished. No distress.    HENT:  Head: Normocephalic and atraumatic.  Right Ear: Hearing normal.  Left Ear: Hearing normal.  Nose: Nose normal.  Eyes: Conjunctivae and lids are normal. Right eye exhibits no discharge. Left eye exhibits no discharge. No scleral icterus.  Cardiovascular: Normal rate, regular rhythm, normal heart sounds and intact distal pulses. Exam reveals no gallop and no friction rub.  No murmur heard. Pulmonary/Chest: Effort normal and breath sounds normal. No respiratory distress. She has no wheezes. She has no rales. She exhibits no tenderness.  Musculoskeletal: Normal range of motion.  Neurological: She is alert and oriented to person, place, and time.  Skin: Skin is warm, dry and intact. No rash noted. She is not diaphoretic. No erythema. No pallor.  Psychiatric: She has a normal mood and affect. Her speech is normal and behavior is normal. Judgment and thought content normal. Cognition and memory are normal.  Nursing note and vitals reviewed.   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 Continue current regimen. Continue to monitor. Call with any concerns. Recheck A1c in early May.      Relevant Medications   quinapril (ACCUPRIL) 20 MG tablet   Other Relevant Orders   Basic metabolic panel     Genitourinary   Benign hypertensive renal disease    Under good control. Continue current  regimen. Continue to monitor.       Relevant Orders   Basic metabolic panel       Follow up plan: Return Early May, for DM visit.

## 2018-03-01 NOTE — Assessment & Plan Note (Signed)
Under good control. Continue current regimen. Continue to monitor.  

## 2018-03-01 NOTE — Assessment & Plan Note (Signed)
Will check labs today. Continue current regimen. Continue to monitor. Call with any concerns. Recheck A1c in early May.

## 2018-03-02 LAB — BASIC METABOLIC PANEL
BUN / CREAT RATIO: 11 — AB (ref 12–28)
BUN: 9 mg/dL (ref 8–27)
CHLORIDE: 101 mmol/L (ref 96–106)
CO2: 25 mmol/L (ref 20–29)
Calcium: 9.7 mg/dL (ref 8.7–10.3)
Creatinine, Ser: 0.81 mg/dL (ref 0.57–1.00)
GFR calc non Af Amer: 73 mL/min/{1.73_m2} (ref >59)
GFR, EST AFRICAN AMERICAN: 85 mL/min/{1.73_m2} (ref >59)
GLUCOSE: 212 mg/dL — AB (ref 65–99)
Potassium: 4.1 mmol/L (ref 3.5–5.2)
SODIUM: 140 mmol/L (ref 134–144)

## 2018-03-04 ENCOUNTER — Encounter: Payer: Self-pay | Admitting: Family Medicine

## 2018-03-11 ENCOUNTER — Other Ambulatory Visit: Payer: Self-pay | Admitting: Family Medicine

## 2018-04-23 ENCOUNTER — Ambulatory Visit: Payer: Medicare Other | Admitting: Family Medicine

## 2018-04-23 NOTE — Progress Notes (Deleted)
   There were no vitals taken for this visit.   Subjective:    Patient ID: Brittany Sherman, female    DOB: 02/21/1946, 72 y.o.   MRN: [id number]  HPI: Brittany Sherman is a 72 y.o. female  No chief complaint on file.  DIABETES Hypoglycemic episodes:{Blank single:19197::"yes","no"} Polydipsia/polyuria: {Blank single:19197::"yes","no"} Visual disturbance: {Blank single:19197::"yes","no"} Chest pain: {Blank single:19197::"yes","no"} Paresthesias: {Blank single:19197::"yes","no"} Glucose Monitoring: {Blank single:19197::"yes","no"}  Accucheck frequency: {Blank single:19197::"Not Checking","Daily","BID","TID"}  Fasting glucose:  Post prandial:  Evening:  Before meals: Taking Insulin?: {Blank single:19197::"yes","no"}  Long acting insulin:  Short acting insulin: Blood Pressure Monitoring: {Blank single:19197::"not checking","rarely","daily","weekly","monthly","a few times a day","a few times a week","a few times a month"} Retinal Examination: {Blank single:19197::"Up to Date","Not up to Date"} Foot Exam: {Blank single:19197::"Up to Date","Not up to Date"} Diabetic Education: {Blank single:19197::"Completed","Not Completed"} Pneumovax: {Blank single:19197::"Up to Date","Not up to Date","unknown"} Influenza: {Blank single:19197::"Up to Date","Not up to Date","unknown"} Aspirin: {Blank single:19197::"yes","no"}   Relevant past medical, surgical, family and social history reviewed and updated as indicated. Interim medical history since our last visit reviewed. Allergies and medications reviewed and updated.  Review of Systems  Per HPI unless specifically indicated above     Objective:    There were no vitals taken for this visit.  Wt Readings from Last 3 Encounters:  03/01/18 181 lb 12.8 oz (82.5 kg)  02/01/18 183 lb 3 oz (83.1 kg)  01/18/18 186 lb (84.4 kg)    Physical Exam  Results for orders placed or performed in visit on 51/70/01  Basic metabolic panel  Result Value  Ref Range   Glucose 212 (H) 65 - 99 mg/dL   BUN 9 8 - 27 mg/dL   Creatinine, Ser 0.81 0.57 - 1.00 mg/dL   GFR calc non Af Amer 73 >59 mL/min/1.73   GFR calc Af Amer 85 >59 mL/min/1.73   BUN/Creatinine Ratio 11 (L) 12 - 28   Sodium 140 134 - 144 mmol/L   Potassium 4.1 3.5 - 5.2 mmol/L   Chloride 101 96 - 106 mmol/L   CO2 25 20 - 29 mmol/L   Calcium 9.7 8.7 - 10.3 mg/dL      Assessment & Plan:   Problem List Items Addressed This Visit      Endocrine   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

## 2018-05-08 ENCOUNTER — Other Ambulatory Visit: Payer: Self-pay | Admitting: Family Medicine

## 2018-05-11 ENCOUNTER — Emergency Department
Admission: EM | Admit: 2018-05-11 | Discharge: 2018-05-11 | Disposition: A | Discharge status: Home / Self Care | Payer: Medicare Other | Attending: Emergency Medicine | Admitting: Emergency Medicine

## 2018-05-11 ENCOUNTER — Emergency Department: Payer: Medicare Other

## 2018-05-11 ENCOUNTER — Encounter: Payer: Self-pay | Admitting: Emergency Medicine

## 2018-05-11 ENCOUNTER — Other Ambulatory Visit: Payer: Self-pay

## 2018-05-11 DIAGNOSIS — M25552 Pain in left hip: Secondary | ICD-10-CM | POA: Diagnosis not present

## 2018-05-11 DIAGNOSIS — N183 Chronic kidney disease, stage 3 (moderate): Secondary | ICD-10-CM | POA: Diagnosis not present

## 2018-05-11 DIAGNOSIS — I129 Hypertensive chronic kidney disease with stage 1 through stage 4 chronic kidney disease, or unspecified chronic kidney disease: Secondary | ICD-10-CM | POA: Insufficient documentation

## 2018-05-11 DIAGNOSIS — Z794 Long term (current) use of insulin: Secondary | ICD-10-CM | POA: Diagnosis not present

## 2018-05-11 DIAGNOSIS — E1122 Type 2 diabetes mellitus with diabetic chronic kidney disease: Secondary | ICD-10-CM | POA: Insufficient documentation

## 2018-05-11 DIAGNOSIS — Z87891 Personal history of nicotine dependence: Secondary | ICD-10-CM | POA: Diagnosis not present

## 2018-05-11 DIAGNOSIS — Z79899 Other long term (current) drug therapy: Secondary | ICD-10-CM | POA: Insufficient documentation

## 2018-05-11 DIAGNOSIS — Z96642 Presence of left artificial hip joint: Secondary | ICD-10-CM | POA: Diagnosis not present

## 2018-05-11 DIAGNOSIS — M79605 Pain in left leg: Secondary | ICD-10-CM | POA: Diagnosis present

## 2018-05-11 NOTE — ED Provider Notes (Signed)
[facility] Emergency Department Provider Note  ____________________________________________  Time seen: Approximately 9:10 PM  I have reviewed the triage vital signs and the nursing notes.   HISTORY  Chief Complaint Leg Pain and Knee Problem    HPI Brittany Sherman is a 72 y.o. female with a history of left total hip arthroplasty, presents to the emergency department with left-sided groin pain.  Patient currently rates her pain at 7 out of 10 in intensity and reports that her pain is relieved with over-the-counter Tylenol.  Patient denies falls or other mechanisms of trauma.  Patient reports that she has been working out in her yard more than usual.  Patient reports that over the winter, she was much more sedentary.  She denies numbness and tingling in the left lower extremity.   Past Medical History:  Diagnosis Date  . Arthritis   . Benign neoplasm of rectum and anal canal   . Diabetes mellitus without complication ([place])   . History of vertigo   . Hypertension   . Immune deficiency disorder ([place])   . Infectious colitis, enteritis and gastroenteritis   . Partial epilepsy with impairment of consciousness ([place])   . Seizures ([place])   . Stroke ([place])   . Trigger finger of right thumb   . Vitamin D deficiency     Patient Active Problem List   Diagnosis Date Noted  . Baker's cyst of knee, left 08/13/2017  . Traumatic avulsion of nail plate of finger 16/09/9603  . Osteoarthritis 02/27/2017  . CKD (chronic kidney disease), stage III ([place]) 02/27/2017  . Hyperlipidemia associated with type 2 diabetes mellitus ([place]) 02/27/2017  . Benign hypertensive renal disease   . Uncontrolled type 2 diabetes with neuropathy ([place])   . History of stroke without residual deficits   . Vitamin D deficiency   . Trigger finger of right thumb   . Benign neoplasm of colon   . Partial epilepsy with impairment of consciousness [facility])     Past Surgical History:  Procedure  Laterality Date  . ABDOMINAL HYSTERECTOMY    . COLONOSCOPY WITH PROPOFOL N/A 07/24/2017   Procedure: COLONOSCOPY WITH PROPOFOL;  Surgeon: Jonathon Bellows, MD;  Location: [facility] ENDOSCOPY;  Service: Endoscopy;  Laterality: N/A;  . OOPHORECTOMY    . SPINE SURGERY    . TOTAL HIP ARTHROPLASTY  2017    Prior to Admission medications   Medication Sig Start Date End Date Taking? Authorizing Provider  amLODipine (NORVASC) 5 MG tablet Take 1 tablet (5 mg total) by mouth daily. 02/01/18   Johnson, Megan P, DO  atorvastatin (LIPITOR) 40 MG tablet Take 1 tablet (40 mg total) by mouth every morning. 01/18/18   Johnson, Megan P, DO  divalproex (DEPAKOTE) 250 MG DR tablet Take 250 mg by mouth 2 (two) times daily. 09/16/15   [provider]  glimepiride (AMARYL) 4 MG tablet Take 1 tablet (4 mg total) by mouth 2 (two) times daily. 02/01/18   Johnson, Megan P, DO  insulin degludec (TRESIBA FLEXTOUCH) 100 UNIT/ML SOPN FlexTouch Pen Inject 0.2 mLs (20 Units total) into the skin daily at 10 pm. 01/18/18   Wynetta Emery, Megan P, DO  Insulin Pen Needle (PEN NEEDLES) 32G X 6 MM MISC 1 each by Does not apply route daily. 03/01/18   Johnson, Megan P, DO  lamoTRIgine (LAMICTAL) 100 MG tablet Take 100 mg by mouth 2 (two) times daily. 09/23/15   [provider]  quinapril (ACCUPRIL) 20 MG tablet Take 20 mg by mouth  daily. 02/08/18   [provider]  quinapril (ACCUPRIL) 20 MG tablet TAKE 1 TABLET BY MOUTH ONCE DAILY 05/09/18   Johnson, Megan P, DO  quinapril-hydrochlorothiazide (ACCURETIC) 20-25 MG tablet Take 1 tablet by mouth daily. 01/18/18   Park Liter P, DO    Allergies Aspirin; Metformin and related; and Penicillins  Family History  Problem Relation Age of Onset  . Brain cancer Mother   . Esophageal cancer Sister   . Breast cancer Cousin   . Aneurysm Sister   . Cancer Sister     Social History Social History   Tobacco Use  . Smoking status: Former Smoker    Last attempt to quit: 12/18/2006     Years since quitting: 11.4  . Smokeless tobacco: Never Used  Substance Use Topics  . Alcohol use: No  . Drug use: No     Review of Systems  Constitutional: No fever/chills Eyes: No visual changes. No discharge ENT: No upper respiratory complaints. Cardiovascular: no chest pain. Respiratory: no cough. No SOB. Gastrointestinal: No abdominal pain.  No nausea, no vomiting.  No diarrhea.  No constipation. Genitourinary: Negative for dysuria. No hematuria Musculoskeletal: Patient has left hip pain.  Skin: Negative for rash, abrasions, lacerations, ecchymosis. Neurological: Negative for headaches, focal weakness or numbness.  ____________________________________________   PHYSICAL EXAM:  VITAL SIGNS: ED Triage Vitals  Enc Vitals Group     BP 05/11/18 1741 139/74     Pulse Rate 05/11/18 1741 80     Resp 05/11/18 1741 18     Temp 05/11/18 1741 98.3 F (36.8 C)     Temp Source 05/11/18 1741 Oral     SpO2 05/11/18 1741 100 %     Weight 05/11/18 1738 180 lb (81.6 kg)     Height 05/11/18 1738 5\' 5"  (1.651 m)     Head Circumference --      Peak Flow --      Pain Score 05/11/18 1738 10     Pain Loc --      Pain Edu? --      Excl. in [place]? --      Constitutional: Alert and oriented. Well appearing and in no acute distress. Eyes: Conjunctivae are normal. PERRL. EOMI. Head: Atraumatic. Cardiovascular: Normal rate, regular rhythm. Normal S1 and S2.  Good peripheral circulation. Respiratory: Normal respiratory effort without tachypnea or retractions. Lungs CTAB. Good air entry to the bases with no decreased or absent breath sounds. Gastrointestinal: Bowel sounds 4 quadrants. Soft and nontender to palpation. No guarding or rigidity. No palpable masses. No distention. No CVA tenderness. Musculoskeletal: Patient has reproducible pain with internal and external rotation at the left hip.  No pain is elicited with palpation of the left calf. Palpable dorsalis pedis pulse, left. Neurologic:   Normal speech and language. No gross focal neurologic deficits are appreciated.  Skin:  Skin is warm, dry and intact. No rash noted. Psychiatric: Mood and affect are normal. Speech and behavior are normal. Patient exhibits appropriate insight and judgement.   ____________________________________________   LABS (all labs ordered are listed, but only abnormal results are displayed)  Labs Reviewed - No data to display ____________________________________________  EKG   ____________________________________________  RADIOLOGY Unk Pinto, personally viewed and evaluated these images (plain radiographs) as part of my medical decision making, as well as reviewing the written report by the radiologist.  US Venous Img Lower Unilateral Left  Result Date: 05/11/2018 CLINICAL DATA:  Left lower extremity pain and swelling/edema for 1  year EXAM: Left LOWER EXTREMITY VENOUS DOPPLER ULTRASOUND TECHNIQUE: Gray-scale sonography with graded compression, as well as color Doppler and duplex ultrasound were performed to evaluate the lower extremity deep venous systems from the level of the common femoral vein and including the common femoral, femoral, profunda femoral, popliteal and calf veins including the posterior tibial, peroneal and gastrocnemius veins when visible. The superficial great saphenous vein was also interrogated. Spectral Doppler was utilized to evaluate flow at rest and with distal augmentation maneuvers in the common femoral, femoral and popliteal veins. COMPARISON:  04/09/2017 FINDINGS: Contralateral Common Femoral Vein: Respiratory phasicity is normal and symmetric with the symptomatic side. No evidence of thrombus. Normal compressibility. Common Femoral Vein: No evidence of thrombus. Normal compressibility, respiratory phasicity and response to augmentation. Saphenofemoral Junction: No evidence of thrombus. Normal compressibility and flow on color Doppler imaging. Profunda Femoral Vein:  No evidence of thrombus. Normal compressibility and flow on color Doppler imaging. Femoral Vein: No evidence of thrombus. Normal compressibility, respiratory phasicity and response to augmentation. Popliteal Vein: No evidence of thrombus. Normal compressibility, respiratory phasicity and response to augmentation. Calf Veins: No evidence of thrombus. Normal compressibility and flow on color Doppler imaging. Other Findings:  1.9 x 1.7 x 0.9 cm popliteal fossa hypoechoic cyst IMPRESSION: No evidence of deep venous thrombosis. 1.9 cm hypoechoic cyst in the popliteal fossa Electronically Signed   By: Donavan Foil M.D.   On: 05/11/2018 18:40   Dg Hip Unilat W Or Wo Pelvis 2-3 Views Left  Result Date: 05/11/2018 CLINICAL DATA:  Pain inability to bear weight EXAM: DG HIP (WITH OR WITHOUT PELVIS) 2-3V LEFT COMPARISON:  None. FINDINGS: Frontal pelvis as well as frontal and lateral left hip images were obtained. There is a total hip replacement on the left with prosthetic components well-seated. There is evidence of an old avulsion of the lesser trochanter on the left. No acute fracture or dislocation is evident. There is moderate narrowing of the right hip joint. Sacroiliac joints bilaterally appear normal. There is calcification in the lateral right buttocks region which appears benign. IMPRESSION: Total hip replacement on the left with prosthetic components well-seated. Old avulsion lesser trochanter on the left. No acute fracture or dislocation. Moderate narrowing right hip joint. Electronically Signed   By: Lowella Grip III M.D.   On: 05/11/2018 19:37    ____________________________________________    PROCEDURES  Procedure(s) performed:    Procedures    Medications - No data to display   ____________________________________________   INITIAL IMPRESSION / ASSESSMENT AND PLAN / ED COURSE  Pertinent labs & imaging results that were available during my care of the patient were reviewed by me  and considered in my medical decision making (see chart for details).  Review of the [place] CSRS was performed in accordance of the [place] prior to dispensing any controlled drugs.     Assessment and plan Left hip pain Patient presents to the emergency department with left hip pain.  Differential diagnosis included DVT, disruption of arthroplasty hardware and fracture.  No acute abnormalities were identified on x-ray.  Patient was advised to continue using Tylenol and to follow-up with orthopedics.  Vital signs were reassuring prior to discharge.   ____________________________________________  FINAL CLINICAL IMPRESSION(S) / ED DIAGNOSES  Final diagnoses:  Left hip pain      NEW MEDICATIONS STARTED DURING THIS VISIT:  ED Discharge Orders    None          This chart was dictated using voice recognition software/Dragon. Despite  best efforts to proofread, errors can occur which can change the meaning. Any change was purely unintentional.    Karren Cobble 05/11/18 2116    Lavonia Drafts, MD 05/11/18 2220

## 2018-05-11 NOTE — ED Triage Notes (Addendum)
Pt arrived via POV with reports of left leg swelling and pain in left groin area. Pt states sxs started  Yesterday.  Swelling noted to left lower leg.  Pt also states she has a bakers cyst on the left leg and fluid around knee. Pt denies any knee pain.    Extremity is normal temp and color.

## 2018-05-11 NOTE — ED Notes (Signed)
Pt c/o left leg pain that radiates into groin area - she states that the pain started yesterday and has gotten worse today - she reports that she is unable to bear weight on that leg and unable to bend it or pick it up without severe pain - pt denies any swelling

## 2018-06-10 ENCOUNTER — Other Ambulatory Visit: Payer: Self-pay | Admitting: Family Medicine

## 2018-06-10 DIAGNOSIS — Z1231 Encounter for screening mammogram for malignant neoplasm of breast: Secondary | ICD-10-CM

## 2018-07-01 ENCOUNTER — Ambulatory Visit
Admission: RE | Admit: 2018-07-01 | Discharge: 2018-07-01 | Disposition: A | Discharge status: Home / Self Care | Payer: Medicare Other | Source: Ambulatory Visit | Attending: Family Medicine | Admitting: Family Medicine

## 2018-07-01 DIAGNOSIS — Z1231 Encounter for screening mammogram for malignant neoplasm of breast: Secondary | ICD-10-CM | POA: Insufficient documentation

## 2018-07-04 ENCOUNTER — Ambulatory Visit: Payer: Self-pay

## 2018-07-10 ENCOUNTER — Other Ambulatory Visit: Payer: Self-pay | Admitting: Family Medicine

## 2018-07-10 NOTE — Telephone Encounter (Signed)
Needs appointment

## 2018-07-12 ENCOUNTER — Telehealth: Payer: Self-pay

## 2018-07-12 MED ORDER — QUINAPRIL-HYDROCHLOROTHIAZIDE 20-25 MG PO TABS: 1.0000 | ORAL_TABLET | Freq: Every day | ORAL | 1 refills | Status: DC

## 2018-07-12 NOTE — Telephone Encounter (Signed)
Patient called, phone kept ringing and no answering machine.

## 2018-07-12 NOTE — Telephone Encounter (Signed)
Appointment scheduled for 07/18/18. Patient needs a refill on Quinapril 20mg   [facility] [address]

## 2018-07-16 NOTE — Telephone Encounter (Signed)
Pt has an appt Thursday. °

## 2018-07-18 ENCOUNTER — Ambulatory Visit (INDEPENDENT_AMBULATORY_CARE_PROVIDER_SITE_OTHER): Payer: Medicare Other

## 2018-07-18 ENCOUNTER — Ambulatory Visit (INDEPENDENT_AMBULATORY_CARE_PROVIDER_SITE_OTHER): Payer: Medicare Other | Admitting: Family Medicine

## 2018-07-18 ENCOUNTER — Encounter: Payer: Self-pay | Admitting: Family Medicine

## 2018-07-18 VITALS — BP 132/78 | HR 81 | Temp 98.0°F | Resp 16 | Ht 66.0 in | Wt 178.0 lb

## 2018-07-18 VITALS — BP 132/78 | HR 81 | Temp 98.0°F | Ht 66.0 in | Wt 177.0 lb

## 2018-07-18 DIAGNOSIS — E559 Vitamin D deficiency, unspecified: Secondary | ICD-10-CM

## 2018-07-18 DIAGNOSIS — Z0000 Encounter for general adult medical examination without abnormal findings: Secondary | ICD-10-CM | POA: Diagnosis not present

## 2018-07-18 DIAGNOSIS — N183 Chronic kidney disease, stage 3 unspecified: Secondary | ICD-10-CM

## 2018-07-18 DIAGNOSIS — E1169 Type 2 diabetes mellitus with other specified complication: Secondary | ICD-10-CM | POA: Diagnosis not present

## 2018-07-18 DIAGNOSIS — I129 Hypertensive chronic kidney disease with stage 1 through stage 4 chronic kidney disease, or unspecified chronic kidney disease: Secondary | ICD-10-CM

## 2018-07-18 DIAGNOSIS — E1140 Type 2 diabetes mellitus with diabetic neuropathy, unspecified: Secondary | ICD-10-CM | POA: Diagnosis not present

## 2018-07-18 DIAGNOSIS — E785 Hyperlipidemia, unspecified: Secondary | ICD-10-CM

## 2018-07-18 DIAGNOSIS — E1165 Type 2 diabetes mellitus with hyperglycemia: Secondary | ICD-10-CM

## 2018-07-18 DIAGNOSIS — G40209 Localization-related (focal) (partial) symptomatic epilepsy and epileptic syndromes with complex partial seizures, not intractable, without status epilepticus: Secondary | ICD-10-CM

## 2018-07-18 DIAGNOSIS — Z1159 Encounter for screening for other viral diseases: Secondary | ICD-10-CM

## 2018-07-18 DIAGNOSIS — IMO0002 Reserved for concepts with insufficient information to code with codable children: Secondary | ICD-10-CM

## 2018-07-18 LAB — UA/M W/RFLX CULTURE, ROUTINE
Bilirubin, UA: NEGATIVE
Leukocytes, UA: NEGATIVE
Nitrite, UA: NEGATIVE
PROTEIN UA: NEGATIVE
RBC UA: NEGATIVE
SPEC GRAV UA: 1.025 (ref 1.005–1.030)
Urobilinogen, Ur: 0.2 mg/dL (ref 0.2–1.0)
pH, UA: 5 (ref 5.0–7.5)

## 2018-07-18 LAB — MICROALBUMIN, URINE WAIVED
Creatinine, Urine Waived: 200 mg/dL (ref 10–300)
MICROALB, UR WAIVED: 30 mg/L — AB (ref 0–19)
Microalb/Creat Ratio: <30 mg/g (ref <30)

## 2018-07-18 LAB — BAYER DCA HB A1C WAIVED: HB A1C (BAYER DCA - WAIVED): 11.9 % — ABNORMAL HIGH (ref <7.0)

## 2018-07-18 MED ORDER — GLIMEPIRIDE 4 MG PO TABS: 4.0000 mg | ORAL_TABLET | Freq: Two times a day (BID) | ORAL | 1 refills | Status: DC

## 2018-07-18 MED ORDER — QUINAPRIL HCL 20 MG PO TABS: 20.0000 mg | ORAL_TABLET | Freq: Every day | ORAL | 1 refills | Status: DC

## 2018-07-18 MED ORDER — AMLODIPINE BESYLATE 5 MG PO TABS: 5.0000 mg | ORAL_TABLET | Freq: Every day | ORAL | 3 refills | Status: DC

## 2018-07-18 MED ORDER — EMPAGLIFLOZIN 25 MG PO TABS: 25.0000 mg | ORAL_TABLET | Freq: Every day | ORAL | 3 refills | Status: DC

## 2018-07-18 MED ORDER — PEN NEEDLES 32G X 6 MM MISC: 1.0000 | Freq: Every day | 12 refills | Status: AC

## 2018-07-18 MED ORDER — QUINAPRIL-HYDROCHLOROTHIAZIDE 20-25 MG PO TABS: 1.0000 | ORAL_TABLET | Freq: Every day | ORAL | 1 refills | Status: DC

## 2018-07-18 MED ORDER — INSULIN DEGLUDEC 100 UNIT/ML ~~LOC~~ SOPN: 30.0000 [IU] | PEN_INJECTOR | Freq: Every day | SUBCUTANEOUS | 3 refills | Status: DC

## 2018-07-18 MED ORDER — ATORVASTATIN CALCIUM 40 MG PO TABS: 40.0000 mg | ORAL_TABLET | Freq: Every morning | ORAL | 1 refills | Status: DC

## 2018-07-18 NOTE — Assessment & Plan Note (Signed)
Under good control. Call with any concerns. Refills given today.

## 2018-07-18 NOTE — Assessment & Plan Note (Signed)
Not under good control with A1c of 11.9. Will start jardiance and increase her tresiba to 30 units call with any concerns. Recheck 3 months.

## 2018-07-18 NOTE — Assessment & Plan Note (Signed)
Continue to follow with neurology. Call with any concerns. Checking depakote levels today.

## 2018-07-18 NOTE — Assessment & Plan Note (Signed)
Rechecking levels today. Call with any concerns. Continue to monitor. Refills given.

## 2018-07-18 NOTE — Assessment & Plan Note (Signed)
Rechecking levels today. Call with any concerns. Continue to monitor.  

## 2018-07-18 NOTE — Patient Instructions (Signed)
[name] , Thank you for taking time to come for your Medicare Wellness Visit. I appreciate your ongoing commitment to your health goals. Please review the following plan we discussed and let me know if I can assist you in the future.   Screening recommendations/referrals: Colonoscopy: completed 07/24/2017 Mammogram: completed 07/01/2018 Bone Density: completed 11/23/2015 Recommended yearly ophthalmology/optometry visit for glaucoma screening and checkup Recommended yearly dental visit for hygiene and checkup  Vaccinations: Influenza vaccine: up to date  Pneumococcal vaccine: completed series  Tdap vaccine: up to date  Shingles vaccine: shingrix eligible, check with your insurance company for coverage   Advanced directives: Please bring a copy of your health care power of attorney and living will to the office at your convenience.  Conditions/risks identified: recommend drinking at least 6-8 glasses of water a day   Next appointment: Follow up in one year for your annual wellness exam.    Preventive Care 65 Years and Older, Female Preventive care refers to lifestyle choices and visits with your health care provider that can promote health and wellness. What does preventive care include?  A yearly physical exam. This is also called an annual well check.  Dental exams once or twice a year.  Routine eye exams. Ask your health care provider how often you should have your eyes checked.  Personal lifestyle choices, including:  Daily care of your teeth and gums.  Regular physical activity.  Eating a healthy diet.  Avoiding tobacco and drug use.  Limiting alcohol use.  Practicing safe sex.  Taking low-dose aspirin every day.  Taking vitamin and mineral supplements as recommended by your health care provider. What happens during an annual well check? The services and screenings done by your health care provider during your annual well check will depend on your age, overall  health, lifestyle risk factors, and family history of disease. Counseling  Your health care provider may ask you questions about your:  Alcohol use.  Tobacco use.  Drug use.  Emotional well-being.  Home and relationship well-being.  Sexual activity.  Eating habits.  History of falls.  Memory and ability to understand (cognition).  Work and work Statistician.  Reproductive health. Screening  You may have the following tests or measurements:  Height, weight, and BMI.  Blood pressure.  Lipid and cholesterol levels. These may be checked every 5 years, or more frequently if you are over 87 years old.  Skin check.  Lung cancer screening. You may have this screening every year starting at age 11 if you have a 30-pack-year history of smoking and currently smoke or have quit within the past 15 years.  Fecal occult blood test (FOBT) of the stool. You may have this test every year starting at age 72.  Flexible sigmoidoscopy or colonoscopy. You may have a sigmoidoscopy every 5 years or a colonoscopy every 10 years starting at age 56.  Hepatitis C blood test.  Hepatitis B blood test.  Sexually transmitted disease (STD) testing.  Diabetes screening. This is done by checking your blood sugar (glucose) after you have not eaten for a while (fasting). You may have this done every 1-3 years.  Bone density scan. This is done to screen for osteoporosis. You may have this done starting at age 36.  Mammogram. This may be done every 1-2 years. Talk to your health care provider about how often you should have regular mammograms. Talk with your health care provider about your test results, treatment options, and if necessary, the need for  more tests. Vaccines  Your health care provider may recommend certain vaccines, such as:  Influenza vaccine. This is recommended every year.  Tetanus, diphtheria, and acellular pertussis (Tdap, Td) vaccine. You may need a Td booster every 10  years.  Zoster vaccine. You may need this after age 34.  Pneumococcal 13-valent conjugate (PCV13) vaccine. One dose is recommended after age 1.  Pneumococcal polysaccharide (PPSV23) vaccine. One dose is recommended after age 11. Talk to your health care provider about which screenings and vaccines you need and how often you need them. This information is not intended to replace advice given to you by your health care provider. Make sure you discuss any questions you have with your health care provider. Document Released: 12/31/2015 Document Revised: 08/23/2016 Document Reviewed: 10/05/2015 Elsevier Interactive Patient Education  2017 [employer] Prevention in the Home Falls can cause injuries. They can happen to people of all ages. There are many things you can do to make your home safe and to help prevent falls. What can I do on the outside of my home?  Regularly fix the edges of walkways and driveways and fix any cracks.  Remove anything that might make you trip as you walk through a door, such as a raised step or threshold.  Trim any bushes or trees on the path to your home.  Use bright outdoor lighting.  Clear any walking paths of anything that might make someone trip, such as rocks or tools.  Regularly check to see if handrails are loose or broken. Make sure that both sides of any steps have handrails.  Any raised decks and porches should have guardrails on the edges.  Have any leaves, snow, or ice cleared regularly.  Use sand or salt on walking paths during winter.  Clean up any spills in your garage right away. This includes oil or grease spills. What can I do in the bathroom?  Use night lights.  Install grab bars by the toilet and in the tub and shower. Do not use towel bars as grab bars.  Use non-skid mats or decals in the tub or shower.  If you need to sit down in the shower, use a plastic, non-slip stool.  Keep the floor dry. Clean up any water that  spills on the floor as soon as it happens.  Remove soap buildup in the tub or shower regularly.  Attach bath mats securely with double-sided non-slip rug tape.  Do not have throw rugs and other things on the floor that can make you trip. What can I do in the bedroom?  Use night lights.  Make sure that you have a light by your bed that is easy to reach.  Do not use any sheets or blankets that are too big for your bed. They should not hang down onto the floor.  Have a firm chair that has side arms. You can use this for support while you get dressed.  Do not have throw rugs and other things on the floor that can make you trip. What can I do in the kitchen?  Clean up any spills right away.  Avoid walking on wet floors.  Keep items that you use a lot in easy-to-reach places.  If you need to reach something above you, use a strong step stool that has a grab bar.  Keep electrical cords out of the way.  Do not use floor polish or wax that makes floors slippery. If you must use wax, use non-skid  floor wax.  Do not have throw rugs and other things on the floor that can make you trip. What can I do with my stairs?  Do not leave any items on the stairs.  Make sure that there are handrails on both sides of the stairs and use them. Fix handrails that are broken or loose. Make sure that handrails are as long as the stairways.  Check any carpeting to make sure that it is firmly attached to the stairs. Fix any carpet that is loose or worn.  Avoid having throw rugs at the top or bottom of the stairs. If you do have throw rugs, attach them to the floor with carpet tape.  Make sure that you have a light switch at the top of the stairs and the bottom of the stairs. If you do not have them, ask someone to add them for you. What else can I do to help prevent falls?  Wear shoes that:  Do not have high heels.  Have rubber bottoms.  Are comfortable and fit you well.  Are closed at the  toe. Do not wear sandals.  If you use a stepladder:  Make sure that it is fully opened. Do not climb a closed stepladder.  Make sure that both sides of the stepladder are locked into place.  Ask someone to hold it for you, if possible.  Clearly mark and make sure that you can see:  Any grab bars or handrails.  First and last steps.  Where the edge of each step is.  Use tools that help you move around (mobility aids) if they are needed. These include:  Canes.  Walkers.  Scooters.  Crutches.  Turn on the lights when you go into a dark area. Replace any light bulbs as soon as they burn out.  Set up your furniture so you have a clear path. Avoid moving your furniture around.  If any of your floors are uneven, fix them.  If there are any pets around you, be aware of where they are.  Review your medicines with your doctor. Some medicines can make you feel dizzy. This can increase your chance of falling. Ask your doctor what other things that you can do to help prevent falls. This information is not intended to replace advice given to you by your health care provider. Make sure you discuss any questions you have with your health care provider. Document Released: 09/30/2009 Document Revised: 05/11/2016 Document Reviewed: 01/08/2015 Elsevier Interactive Patient Education  2017 [employer].

## 2018-07-18 NOTE — Progress Notes (Signed)
BP 132/78   Pulse 81   Temp 98 F (36.7 C) (Oral)   Ht 5\' 6"  (1.676 m)   Wt 177 lb (80.3 kg)   SpO2 98%   BMI 28.57 kg/m    Subjective:    Patient ID: Brittany Sherman, female    DOB: 1946/01/20, 72 y.o.   MRN: [id number]  HPI: Brittany Sherman is a 72 y.o. female  Chief Complaint  Patient presents with  . Diabetes  . Hyperlipidemia  . Hypertension   DIABETES-  Hypoglycemic episodes:no Polydipsia/polyuria: yes Visual disturbance: no Chest pain: no Paresthesias: no Glucose Monitoring: yes  Accucheck frequency: Daily Taking Insulin?: yes Blood Pressure Monitoring: not checking Retinal Examination: Not up to Date Foot Exam: Up to Date Diabetic Education: Completed Pneumovax: Up to date Influenza: unknown Aspirin: no  HYPERTENSION / HYPERLIPIDEMIA Satisfied with current treatment? yes Duration of hypertension: chronic BP monitoring frequency: not checking BP medication side effects: no Past BP meds: quinapril, HCTZ Duration of hyperlipidemia: chronic Cholesterol medication side effects: no Cholesterol supplements: none Past cholesterol medications: atorvastatin Medication compliance: excellent compliance Aspirin: no Recent stressors: no Recurrent headaches: no Visual changes: no Palpitations: no Dyspnea: no Chest pain: no Lower extremity edema: no Dizzy/lightheaded: no  Relevant past medical, surgical, family and social history reviewed and updated as indicated. Interim medical history since our last visit reviewed. Allergies and medications reviewed and updated.  Review of Systems  Constitutional: Negative.   Respiratory: Negative.   Cardiovascular: Negative.   Neurological: Negative.   Psychiatric/Behavioral: Negative.     Per HPI unless specifically indicated above     Objective:    BP 132/78   Pulse 81   Temp 98 F (36.7 C) (Oral)   Ht 5\' 6"  (1.676 m)   Wt 177 lb (80.3 kg)   SpO2 98%   BMI 28.57 kg/m   Wt Readings from Last 3  Encounters:  07/18/18 177 lb (80.3 kg)  07/18/18 178 lb (80.7 kg)  05/11/18 180 lb (81.6 kg)    Physical Exam  Constitutional: She is oriented to person, place, and time. She appears well-developed and well-nourished. No distress.  HENT:  Head: Normocephalic and atraumatic.  Right Ear: Hearing normal.  Left Ear: Hearing normal.  Nose: Nose normal.  Eyes: Conjunctivae and lids are normal. Right eye exhibits no discharge. Left eye exhibits no discharge. No scleral icterus.  Cardiovascular: Normal rate, regular rhythm, normal heart sounds and intact distal pulses. Exam reveals no gallop and no friction rub.  No murmur heard. Pulmonary/Chest: Effort normal and breath sounds normal. No stridor. No respiratory distress. She has no wheezes. She has no rales. She exhibits no tenderness.  Musculoskeletal: Normal range of motion.  Neurological: She is alert and oriented to person, place, and time.  Skin: Skin is warm, dry and intact. Capillary refill takes less than 2 seconds. No rash noted. She is not diaphoretic. No erythema. No pallor.  Psychiatric: She has a normal mood and affect. Her speech is normal and behavior is normal. Judgment and thought content normal. Cognition and memory are normal.  Nursing note and vitals reviewed.   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  Will start jardiance and increase her tresiba to 30 units call with any concerns. Recheck 3 months.       Relevant Medications   empagliflozin (JARDIANCE) 25 MG TABS tablet   atorvastatin (LIPITOR) 40 MG tablet   glimepiride (AMARYL) 4 MG tablet   quinapril (ACCUPRIL) 20 MG tablet   quinapril-hydrochlorothiazide (ACCURETIC) 20-25 MG tablet   insulin degludec (TRESIBA FLEXTOUCH) 100 UNIT/ML SOPN FlexTouch Pen   Other Relevant Orders   Bayer DCA Hb A1c Waived   CBC with Differential/Platelet   Comprehensive metabolic panel   Microalbumin, Urine Waived   TSH   UA/M w/rflx Culture, Routine   Hyperlipidemia associated with type 2 diabetes mellitus (HCC)    Rechecking levels today. Call with any concerns. Continue to monitor. Refills given.       Relevant Medications   empagliflozin (JARDIANCE) 25 MG TABS tablet   atorvastatin (LIPITOR) 40 MG tablet   amLODipine (NORVASC) 5 MG tablet   glimepiride (AMARYL) 4 MG tablet   quinapril (ACCUPRIL) 20 MG tablet   quinapril-hydrochlorothiazide (ACCURETIC) 20-25 MG tablet   insulin degludec (TRESIBA FLEXTOUCH) 100 UNIT/ML SOPN FlexTouch Pen   Other Relevant Orders   CBC with Differential/Platelet   Comprehensive metabolic panel   Lipid Panel w/o Chol/HDL Ratio   TSH   UA/M w/rflx Culture, Routine     Nervous and Auditory   Partial epilepsy with impairment of consciousness ([place])    Continue to follow with neurology. Call with any concerns. Checking depakote levels today.      Relevant Orders   CBC with Differential/Platelet   Comprehensive metabolic panel   TSH   UA/M w/rflx Culture, Routine   Valproic Acid level     Genitourinary   Benign hypertensive renal disease    Under good control. Call with any concerns. Refills given today.       Relevant Orders   CBC with Differential/Platelet   Comprehensive metabolic panel   Microalbumin, Urine Waived   TSH   UA/M w/rflx Culture, Routine   CKD (chronic kidney disease), stage III  (HCC)    Rechecking levels today. Call with any concerns. Continue to monitor.      Relevant Orders   CBC with Differential/Platelet   Comprehensive metabolic panel   TSH   UA/M w/rflx Culture, Routine     Other   Vitamin D deficiency    Rechecking levels today. Call with any concerns. Continue to monitor.      Relevant Orders   CBC with Differential/Platelet   Comprehensive metabolic panel   TSH   UA/M w/rflx Culture, Routine   VITAMIN D 25 Hydroxy (Vit-D Deficiency, Fractures)       Follow up plan: Return in about 3 months (around 10/18/2018) for Follow up DM.

## 2018-07-18 NOTE — Progress Notes (Signed)
Subjective:   Brittany Sherman is a 72 y.o. female who presents for Medicare Annual (Subsequent) preventive examination.  Review of Systems:   Cardiac Risk Factors include: advanced age (>65men, >63 women);diabetes mellitus;dyslipidemia;hypertension;obesity (BMI >30kg/m2);smoking/ tobacco exposure     Objective:     Vitals: BP 132/78 (BP Location: Left Arm, Patient Position: Sitting)   Pulse 81   Temp 98 F (36.7 C) (Temporal)   Resp 16   Ht 5\' 6"  (1.676 m)   Wt 178 lb (80.7 kg)   SpO2 98%   BMI 28.73 kg/m   Body mass index is 28.73 kg/m.  Advanced Directives 07/18/2018 07/24/2017 07/04/2017 04/09/2017 07/12/2016 12/11/2015  Does Patient Have a Medical Advance Directive? Yes Yes No No No No  Type of Paramedic of [place];Living will [place] in Chart? No - copy requested No - copy requested - - - -  Would patient like information on creating a medical advance directive? - No - Patient declined Yes (MAU/Ambulatory/Procedural Areas - Information given) No - Patient declined Yes - Educational materials given Yes - Scientist, clinical (histocompatibility and immunogenetics) given    Tobacco Social History   Tobacco Use  Smoking Status Former Smoker  . Packs/day: 1.50  . Types: Cigarettes  . Last attempt to quit: 12/18/2006  . Years since quitting: 11.5  Smokeless Tobacco Never Used     Counseling given: No   Clinical Intake:  Pre-visit preparation completed: Yes  Pain : No/denies pain     Nutritional Status: BMI 25 -29 Overweight Nutritional Risks: None Diabetes: Yes CBG done?: No Did pt. bring in CBG monitor from home?: No  How often do you need to have someone help you when you read instructions, pamphlets, or other written materials from your doctor or pharmacy?: 1 - Never What is the last grade level you completed in school?: 8th grade  Interpreter Needed?: No  Information entered by :: Tiffany Hill,LPN    Past Medical History:  Diagnosis Date  . Arthritis   . Benign neoplasm of rectum and anal canal   . Diabetes mellitus without complication ([place])   . History of vertigo   . Hypertension   . Immune deficiency disorder ([place])   . Infectious colitis, enteritis and gastroenteritis   . Partial epilepsy with impairment of consciousness ([place])   . Seizures ([place])   . Stroke ([place])   . Trigger finger of right thumb   . Vitamin D deficiency    Past Surgical History:  Procedure Laterality Date  . ABDOMINAL HYSTERECTOMY    . COLONOSCOPY WITH PROPOFOL N/A 07/24/2017   Procedure: COLONOSCOPY WITH PROPOFOL;  Surgeon: Jonathon Bellows, MD;  Location: [facility] ENDOSCOPY;  Service: Endoscopy;  Laterality: N/A;  . OOPHORECTOMY    . SPINE SURGERY    . TOTAL HIP ARTHROPLASTY  2017   Family History  Problem Relation Age of Onset  . Brain cancer Mother   . Esophageal cancer Sister   . Breast cancer Cousin   . Aneurysm Sister   . Cancer Sister    Social History   Socioeconomic History  . Marital status: Married    Spouse name: Not on file  . Number of children: Not on file  . Years of education: Not on file  . Highest education level: 8th grade  Occupational History  . Not on file  Social Needs  . Financial resource strain: Not very hard  . Food insecurity:  Worry: Never true    Inability: Never true  . Transportation needs:    Medical: No    Non-medical: No  Tobacco Use  . Smoking status: Former Smoker    Packs/day: 1.50    Types: Cigarettes    Last attempt to quit: 12/18/2006    Years since quitting: 11.5  . Smokeless tobacco: Never Used  Substance and Sexual Activity  . Alcohol use: No  . Drug use: No  . Sexual activity: Never  Lifestyle  . Physical activity:    Days per week: 0 days    Minutes per session: 0 min  . Stress: Not at all  Relationships  . Social connections:    Talks on phone: More than three times a week    Gets together: More than three times a week    Attends  religious service: More than 4 times per year    Active member of club or organization: No    Attends meetings of clubs or organizations: Never    Relationship status: Married  Other Topics Concern  . Not on file  Social History Narrative  . Not on file    Outpatient Encounter Medications as of 07/18/2018  Medication Sig  . amLODipine (NORVASC) 5 MG tablet Take 1 tablet (5 mg total) by mouth daily.  Marland Kitchen atorvastatin (LIPITOR) 40 MG tablet Take 1 tablet (40 mg total) by mouth every morning.  . divalproex (DEPAKOTE) 250 MG DR tablet Take 250 mg by mouth 2 (two) times daily.  Marland Kitchen glimepiride (AMARYL) 4 MG tablet Take 1 tablet (4 mg total) by mouth 2 (two) times daily.  . insulin degludec (TRESIBA FLEXTOUCH) 100 UNIT/ML SOPN FlexTouch Pen Inject 0.2 mLs (20 Units total) into the skin daily at 10 pm.  . Insulin Pen Needle (PEN NEEDLES) 32G X 6 MM MISC 1 each by Does not apply route daily.  Marland Kitchen lamoTRIgine (LAMICTAL) 100 MG tablet Take 100 mg by mouth 2 (two) times daily.  . quinapril-hydrochlorothiazide (ACCURETIC) 20-25 MG tablet Take 1 tablet by mouth daily.  . quinapril (ACCUPRIL) 20 MG tablet Take 20 mg by mouth daily.  . quinapril (ACCUPRIL) 20 MG tablet TAKE 1 TABLET BY MOUTH ONCE DAILY (Patient not taking: Reported on 07/18/2018)   No facility-administered encounter medications on file as of 07/18/2018.     Activities of Daily Living In your present state of health, do you have any difficulty performing the following activities: 07/18/2018  Hearing? N  Vision? Y  Difficulty concentrating or making decisions? N  Walking or climbing stairs? N  Dressing or bathing? N  Doing errands, shopping? N  Preparing Food and eating ? N  Using the Toilet? N  In the past six months, have you accidently leaked urine? N  Do you have problems with loss of bowel control? N  Managing your Medications? N  Managing your Finances? N  Housekeeping or managing your Housekeeping? N  Some recent data might be  hidden    Patient Care Team: Valerie Roys, DO as PCP - General (Family Medicine) Windell Moment as Referring Physician (Psychiatry)    Assessment:   This is a routine wellness examination for Brittany Sherman.  Exercise Activities and Dietary recommendations Current Exercise Habits: The patient does not participate in regular exercise at present, Exercise limited by: None identified  Goals    None      Fall Risk Fall Risk  07/18/2018 07/10/2017 07/04/2017 04/02/2017 02/27/2017  Falls in the past year? No No No No No  Is the patient's home free of loose throw rugs in walkways, pet beds, electrical cords, etc?   yes      Grab bars in the bathroom? no      Handrails on the stairs?   yes      Adequate lighting?   yes  Timed Get Up and Go performed: Completed in 8 seconds with no use of assistive devices, steady gait. No intervention needed at this time.   Depression Screen PHQ 2/9 Scores 07/18/2018 07/10/2017 07/04/2017 04/02/2017  PHQ - 2 Score 0 0 0 0  PHQ- 9 Score - - - 2     Cognitive Function     6CIT Screen 07/18/2018 07/04/2017  What Year? 0 points 0 points  What month? 0 points 0 points  What time? 0 points 0 points  Count back from 20 0 points 0 points  Months in reverse 0 points 0 points  Repeat phrase 0 points 0 points  Total Score 0 0    Immunization History  Administered Date(s) Administered  . Influenza, High Dose Seasonal PF 09/27/2017  . Influenza,inj,Quad PF,6+ Mos 11/02/2016  . Pneumococcal Conjugate-13 11/23/2015  . Pneumococcal Polysaccharide-23 09/29/2013  . Tdap 10/16/2011    Qualifies for Shingles Vaccine? Yes, discussed shingrix vaccine   Screening Tests Health Maintenance  Topic Date Due  . Hepatitis C Screening  06/30/1946  . OPHTHALMOLOGY EXAM  12/27/2017  . INFLUENZA VACCINE  07/18/2018  . HEMOGLOBIN A1C  07/18/2018  . FOOT EXAM  03/02/2019  . MAMMOGRAM  07/01/2020  . COLONOSCOPY  07/24/2020  . TETANUS/TDAP  10/15/2021  . DEXA SCAN  Completed    . PNA vac Low Risk Adult  Completed    Cancer Screenings: Lung: Low Dose CT Chest recommended if Age 48-80 years, 30 pack-year currently smoking OR have quit w/in 15years. Patient does qualify. message sent to Burgess Estelle, RN -Oncology Navigator  Breast:  Up to date on Mammogram? Yes  07/01/2018 Up to date of Bone Density/Dexa? Yes 11/23/2015 Colorectal: 07/24/2017  Additional Screenings:  Hepatitis C Screening: ordered today      Plan:    I have personally reviewed and addressed the Medicare Annual Wellness questionnaire and have noted the following in the patient's chart:  A. Medical and social history B. Use of alcohol, tobacco or illicit drugs  C. Current medications and supplements D. Functional ability and status E.  Nutritional status F.  Physical activity G. Advance directives H. List of other physicians I.  Hospitalizations, surgeries, and ER visits in previous 12 months J.  [place] such as hearing and vision if needed, cognitive and depression L. Referrals and appointments   In addition, I have reviewed and discussed with patient certain preventive protocols, quality metrics, and best practice recommendations. A written personalized care plan for preventive services as well as general preventive health recommendations were provided to patient.   Signed,  Tyler Aas, LPN Nurse Health Advisor   Nurse Notes: had diabetic eye exam with Dr.Bell- will call and get report.

## 2018-07-19 ENCOUNTER — Telehealth: Payer: Self-pay | Admitting: *Deleted

## 2018-07-19 ENCOUNTER — Other Ambulatory Visit: Payer: Self-pay | Admitting: Family Medicine

## 2018-07-19 DIAGNOSIS — Z87891 Personal history of nicotine dependence: Secondary | ICD-10-CM

## 2018-07-19 DIAGNOSIS — Z122 Encounter for screening for malignant neoplasm of respiratory organs: Secondary | ICD-10-CM

## 2018-07-19 LAB — CBC WITH DIFFERENTIAL/PLATELET
BASOS ABS: 0 10*3/uL (ref 0.0–0.2)
Basos: 0 %
EOS (ABSOLUTE): 0 10*3/uL (ref 0.0–0.4)
EOS: 1 %
Hematocrit: 42.4 % (ref 34.0–46.6)
Hemoglobin: 13.1 g/dL (ref 11.1–15.9)
Immature Grans (Abs): 0 10*3/uL (ref 0.0–0.1)
Immature Granulocytes: 0 %
Lymphocytes Absolute: 2.4 10*3/uL (ref 0.7–3.1)
Lymphs: 38 %
MCH: 25.4 pg — ABNORMAL LOW (ref 26.6–33.0)
MCHC: 30.9 g/dL — ABNORMAL LOW (ref 31.5–35.7)
MCV: 82 fL (ref 79–97)
Monocytes Absolute: 0.5 10*3/uL (ref 0.1–0.9)
Monocytes: 7 %
Neutrophils Absolute: 3.4 10*3/uL (ref 1.4–7.0)
Neutrophils: 54 %
PLATELETS: 295 10*3/uL (ref 150–450)
RBC: 5.16 x10E6/uL (ref 3.77–5.28)
RDW: 15.7 % — ABNORMAL HIGH (ref 12.3–15.4)
WBC: 6.2 10*3/uL (ref 3.4–10.8)

## 2018-07-19 LAB — LIPID PANEL W/O CHOL/HDL RATIO
Cholesterol, Total: 135 mg/dL (ref 100–199)
HDL: 44 mg/dL (ref >39)
LDL Calculated: 70 mg/dL (ref 0–99)
Triglycerides: 106 mg/dL (ref 0–149)
VLDL Cholesterol Cal: 21 mg/dL (ref 5–40)

## 2018-07-19 LAB — COMPREHENSIVE METABOLIC PANEL
ALT: 7 IU/L (ref 0–32)
AST: 12 IU/L (ref 0–40)
Albumin/Globulin Ratio: 1.3 (ref 1.2–2.2)
Albumin: 4.1 g/dL (ref 3.5–4.8)
Alkaline Phosphatase: 88 IU/L (ref 39–117)
BUN/Creatinine Ratio: 17 (ref 12–28)
BUN: 13 mg/dL (ref 8–27)
Bilirubin Total: 0.3 mg/dL (ref 0.0–1.2)
CO2: 23 mmol/L (ref 20–29)
Calcium: 9.8 mg/dL (ref 8.7–10.3)
Chloride: 98 mmol/L (ref 96–106)
Creatinine, Ser: 0.78 mg/dL (ref 0.57–1.00)
GFR calc non Af Amer: 76 mL/min/{1.73_m2} (ref >59)
GFR, EST AFRICAN AMERICAN: 88 mL/min/{1.73_m2} (ref >59)
Globulin, Total: 3.1 g/dL (ref 1.5–4.5)
Glucose: 251 mg/dL — ABNORMAL HIGH (ref 65–99)
Potassium: 4.1 mmol/L (ref 3.5–5.2)
Sodium: 139 mmol/L (ref 134–144)
Total Protein: 7.2 g/dL (ref 6.0–8.5)

## 2018-07-19 LAB — HEPATITIS C ANTIBODY: Hep C Virus Ab: <0.1 s/co ratio (ref 0.0–0.9)

## 2018-07-19 LAB — VALPROIC ACID LEVEL: Valproic Acid Lvl: 68 ug/mL (ref 50–100)

## 2018-07-19 LAB — VITAMIN D 25 HYDROXY (VIT D DEFICIENCY, FRACTURES): VIT D 25 HYDROXY: 26.3 ng/mL — AB (ref 30.0–100.0)

## 2018-07-19 LAB — TSH: TSH: 0.902 u[IU]/mL (ref 0.450–4.500)

## 2018-07-19 MED ORDER — VITAMIN D (ERGOCALCIFEROL) 1.25 MG (50000 UNIT) PO CAPS: 50000.0000 [IU] | ORAL_CAPSULE | ORAL | 3 refills | Status: DC

## 2018-07-19 NOTE — Telephone Encounter (Signed)
Received referral for initial lung cancer screening scan. Contacted patient and obtained smoking history,(former, quit 2008, 64.5 pack year) as well as answering questions related to screening process. Patient denies signs of lung cancer such as weight loss or hemoptysis. Patient denies comorbidity that would prevent curative treatment if lung cancer were found. Patient is scheduled for shared decision making visit and CT scan on 08/09/18.

## 2018-07-26 ENCOUNTER — Telehealth: Payer: Self-pay | Admitting: Family Medicine

## 2018-07-26 NOTE — Telephone Encounter (Signed)
Please let her know that sometimes the [place] makes you a little dehydrated. She should really push her water and let me know if she's not feeling better with the leg cramps.

## 2018-07-26 NOTE — Telephone Encounter (Signed)
Attempted to reach patient. Line continuously rang.  

## 2018-07-26 NOTE — Telephone Encounter (Signed)
-----   Message from Volney American, [place] sent at 07/25/2018  5:03 PM EDT -----   ----- Message ----- From: Lesle Chris, CMA Sent: 07/23/2018   2:59 PM EDT To: Volney American, PA-C

## 2018-07-26 NOTE — Telephone Encounter (Signed)
Called patient, no answer, phone continued to ring, unable to leave a message, will try again.

## 2018-07-29 ENCOUNTER — Encounter: Payer: Self-pay | Admitting: Family Medicine

## 2018-07-29 NOTE — Telephone Encounter (Signed)
Patient came into the office and was informed of the below.

## 2018-07-29 NOTE — Telephone Encounter (Signed)
Tried to call patient, no answer, continues to ring. What would you like to do?

## 2018-07-29 NOTE — Telephone Encounter (Signed)
Letter generated to be sent.

## 2018-08-09 ENCOUNTER — Inpatient Hospital Stay: Payer: Medicare Other | Attending: Oncology | Admitting: Oncology

## 2018-08-09 ENCOUNTER — Ambulatory Visit
Admission: RE | Admit: 2018-08-09 | Discharge: 2018-08-09 | Disposition: A | Discharge status: Home / Self Care | Payer: Medicare Other | Source: Ambulatory Visit | Attending: Oncology | Admitting: Oncology

## 2018-08-09 ENCOUNTER — Encounter: Payer: Self-pay | Admitting: Oncology

## 2018-08-09 DIAGNOSIS — Z87891 Personal history of nicotine dependence: Secondary | ICD-10-CM

## 2018-08-09 DIAGNOSIS — J439 Emphysema, unspecified: Secondary | ICD-10-CM | POA: Diagnosis not present

## 2018-08-09 DIAGNOSIS — Z122 Encounter for screening for malignant neoplasm of respiratory organs: Secondary | ICD-10-CM | POA: Insufficient documentation

## 2018-08-09 DIAGNOSIS — I700 Atherosclerosis of aorta: Secondary | ICD-10-CM | POA: Diagnosis not present

## 2018-08-09 DIAGNOSIS — E279 Disorder of adrenal gland, unspecified: Secondary | ICD-10-CM | POA: Insufficient documentation

## 2018-08-09 NOTE — Progress Notes (Signed)
In accordance with CMS guidelines, patient has met eligibility criteria including age, absence of signs or symptoms of lung cancer.  Social History   Tobacco Use  . Smoking status: Former Smoker    Packs/day: 1.50    Years: 43.00    Pack years: 64.50    Types: Cigarettes    Last attempt to quit: 12/18/2006    Years since quitting: 11.6  . Smokeless tobacco: Never Used  Substance Use Topics  . Alcohol use: No  . Drug use: No     A shared decision-making session was conducted prior to the performance of CT scan. This includes one or more decision aids, includes benefits and harms of screening, follow-up diagnostic testing, over-diagnosis, false positive rate, and total radiation exposure.  Counseling on the importance of adherence to annual lung cancer LDCT screening, impact of co-morbidities, and ability or willingness to undergo diagnosis and treatment is imperative for compliance of the program.  Counseling on the importance of continued smoking cessation for former smokers; the importance of smoking cessation for current smokers, and information about tobacco cessation interventions have been given to patient including [place] and 1800 quit [place] programs.  Written order for lung cancer screening with LDCT has been given to the patient and any and all questions have been answered to the best of my abilities.   Yearly follow up will be coordinated by Burgess Estelle, Thoracic Navigator.  Faythe Casa, NP 08/09/2018 12:36 PM

## 2018-08-12 ENCOUNTER — Encounter: Payer: Self-pay | Admitting: Family Medicine

## 2018-08-12 DIAGNOSIS — E279 Disorder of adrenal gland, unspecified: Secondary | ICD-10-CM

## 2018-08-12 DIAGNOSIS — E278 Other specified disorders of adrenal gland: Secondary | ICD-10-CM | POA: Insufficient documentation

## 2018-08-12 DIAGNOSIS — I700 Atherosclerosis of aorta: Secondary | ICD-10-CM | POA: Insufficient documentation

## 2018-08-13 ENCOUNTER — Encounter: Payer: Self-pay | Admitting: *Deleted

## 2018-09-18 ENCOUNTER — Telehealth: Payer: Self-pay | Admitting: Family Medicine

## 2018-09-18 NOTE — Telephone Encounter (Signed)
Erroneous entry

## 2018-10-13 ENCOUNTER — Other Ambulatory Visit: Payer: Self-pay | Admitting: Family Medicine

## 2018-10-14 NOTE — Telephone Encounter (Signed)
Refused at this time. Last refill was 07/18/18 #180 tabs+1 refill.

## 2018-10-22 ENCOUNTER — Encounter: Payer: Self-pay | Admitting: Family Medicine

## 2018-10-22 ENCOUNTER — Ambulatory Visit: Payer: Medicare Other | Admitting: Family Medicine

## 2018-10-22 VITALS — BP 133/73 | HR 83 | Temp 97.8°F | Wt 174.0 lb

## 2018-10-22 DIAGNOSIS — R300 Dysuria: Secondary | ICD-10-CM

## 2018-10-22 DIAGNOSIS — E1165 Type 2 diabetes mellitus with hyperglycemia: Secondary | ICD-10-CM | POA: Diagnosis not present

## 2018-10-22 DIAGNOSIS — E1140 Type 2 diabetes mellitus with diabetic neuropathy, unspecified: Secondary | ICD-10-CM

## 2018-10-22 DIAGNOSIS — IMO0002 Reserved for concepts with insufficient information to code with codable children: Secondary | ICD-10-CM

## 2018-10-22 LAB — UA/M W/RFLX CULTURE, ROUTINE
Bilirubin, UA: NEGATIVE
KETONES UA: NEGATIVE
Leukocytes, UA: NEGATIVE
NITRITE UA: NEGATIVE
Protein, UA: NEGATIVE
RBC UA: NEGATIVE
SPEC GRAV UA: 1.015 (ref 1.005–1.030)
UUROB: 1 mg/dL (ref 0.2–1.0)
pH, UA: 7 (ref 5.0–7.5)

## 2018-10-22 LAB — BAYER DCA HB A1C WAIVED: HB A1C: 8.5 % — AB (ref <7.0)

## 2018-10-22 NOTE — Progress Notes (Signed)
BP 133/73   Pulse 83   Temp 97.8 F (36.6 C) (Oral)   Wt 174 lb (78.9 kg)   SpO2 97%   BMI 28.08 kg/m    Subjective:    Patient ID: Brittany Sherman, female    DOB: Jan 18, 1946, 72 y.o.   MRN: [id number]  HPI: Brittany Sherman is a 72 y.o. female  Chief Complaint  Patient presents with  . Follow-up  . Diabetes  . Medication Problem    Itching, breaking out. Has continued to take it.    DIABETES Hypoglycemic episodes:no Polydipsia/polyuria: yes Visual disturbance: no Chest pain: no Paresthesias: no Glucose Monitoring: no  Accucheck frequency: Not Checking Taking Insulin?: yes Blood Pressure Monitoring: not checking Retinal Examination: Not up to Date Foot Exam: Up to Date Diabetic Education: Completed Pneumovax: Up to Date Influenza: Not up to Date Aspirin: yes  Has been having burning and itching her groin. Concerned that she has a UTI or a yeast infection.  URINARY SYMPTOMS Duration: 3 months Dysuria: no Urinary frequency: yes Urgency: yes Small volume voids: no Symptom severity: mild Urinary incontinence: no Foul odor: no Hematuria: no Abdominal pain: no Back pain: no Suprapubic pain/pressure: no Flank pain: no Fever:  no Vomiting: no Relief with cranberry juice: no Relief with pyridium: no Status: stable Previous urinary tract infection: no Recurrent urinary tract infection: no  Relevant past medical, surgical, family and social history reviewed and updated as indicated. Interim medical history since our last visit reviewed. Allergies and medications reviewed and updated.  Review of Systems  Constitutional: Negative.   Respiratory: Negative.   Cardiovascular: Negative.   Gastrointestinal: Negative.   Skin: Positive for rash. Negative for color change, pallor and wound.  Psychiatric/Behavioral: Negative.     Per HPI unless specifically indicated above     Objective:    BP 133/73   Pulse 83   Temp 97.8 F (36.6 C) (Oral)   Wt 174 lb  (78.9 kg)   SpO2 97%   BMI 28.08 kg/m   Wt Readings from Last 3 Encounters:  10/22/18 174 lb (78.9 kg)  08/09/18 180 lb (81.6 kg)  07/18/18 177 lb (80.3 kg)    Physical Exam  Constitutional: She is oriented to person, place, and time. She appears well-developed and well-nourished. No distress.  HENT:  Head: Normocephalic and atraumatic.  Right Ear: Hearing normal.  Left Ear: Hearing normal.  Nose: Nose normal.  Eyes: Conjunctivae and lids are normal. Right eye exhibits no discharge. Left eye exhibits no discharge. No scleral icterus.  Cardiovascular: Normal rate, regular rhythm, normal heart sounds and intact distal pulses. Exam reveals no gallop and no friction rub.  No murmur heard. Pulmonary/Chest: Effort normal and breath sounds normal. No stridor. No respiratory distress. She has no wheezes. She has no rales. She exhibits no tenderness.  Abdominal: Hernia confirmed negative in the right inguinal area and confirmed negative in the left inguinal area.  Genitourinary: Vagina normal. No labial fusion. There is no rash, tenderness, lesion or injury on the right labia. There is no rash, tenderness, lesion or injury on the left labia. No vaginal discharge found.  Musculoskeletal: Normal range of motion.  Lymphadenopathy: No inguinal adenopathy noted on the right or left side.  Neurological: She is alert and oriented to person, place, and time.  Skin: Skin is warm, dry and intact. Capillary refill takes less than 2 seconds. No rash noted. She is not diaphoretic. No erythema. No pallor.  Psychiatric: She has a normal  mood and affect. Her speech is normal and behavior is normal. Judgment and thought content normal. Cognition and memory are normal.  Nursing note and vitals reviewed. Genital exam done today with Gerda Diss, CMA in attendance.   Results for orders placed or performed in visit on 07/18/18  Bayer DCA Hb A1c Waived  Result Value Ref Range   HB A1C (BAYER DCA - WAIVED) 11.9 (H)  <7.0 %  CBC with Differential/Platelet  Result Value Ref Range   WBC 6.2 3.4 - 10.8 x10E3/uL   RBC 5.16 3.77 - 5.28 x10E6/uL   Hemoglobin 13.1 11.1 - 15.9 g/dL   Hematocrit 42.4 34.0 - 46.6 %   MCV 82 79 - 97 fL   MCH 25.4 (L) 26.6 - 33.0 pg   MCHC 30.9 (L) 31.5 - 35.7 g/dL   RDW 15.7 (H) 12.3 - 15.4 %   Platelets 295 150 - 450 x10E3/uL   Neutrophils 54 Not Estab. %   Lymphs 38 Not Estab. %   Monocytes 7 Not Estab. %   Eos 1 Not Estab. %   Basos 0 Not Estab. %   Neutrophils Absolute 3.4 1.4 - 7.0 x10E3/uL   Lymphocytes Absolute 2.4 0.7 - 3.1 x10E3/uL   Monocytes Absolute 0.5 0.1 - 0.9 x10E3/uL   EOS (ABSOLUTE) 0.0 0.0 - 0.4 x10E3/uL   Basophils Absolute 0.0 0.0 - 0.2 x10E3/uL   Immature Granulocytes 0 Not Estab. %   Immature Grans (Abs) 0.0 0.0 - 0.1 x10E3/uL  Comprehensive metabolic panel  Result Value Ref Range   Glucose 251 (H) 65 - 99 mg/dL   BUN 13 8 - 27 mg/dL   Creatinine, Ser 0.78 0.57 - 1.00 mg/dL   GFR calc non Af Amer 76 >59 mL/min/1.73   GFR calc Af Amer 88 >59 mL/min/1.73   BUN/Creatinine Ratio 17 12 - 28   Sodium 139 134 - 144 mmol/L   Potassium 4.1 3.5 - 5.2 mmol/L   Chloride 98 96 - 106 mmol/L   CO2 23 20 - 29 mmol/L   Calcium 9.8 8.7 - 10.3 mg/dL   Total Protein 7.2 6.0 - 8.5 g/dL   Albumin 4.1 3.5 - 4.8 g/dL   Globulin, Total 3.1 1.5 - 4.5 g/dL   Albumin/Globulin Ratio 1.3 1.2 - 2.2   Bilirubin Total 0.3 0.0 - 1.2 mg/dL   Alkaline Phosphatase 88 39 - 117 IU/L   AST 12 0 - 40 IU/L   ALT 7 0 - 32 IU/L  Lipid Panel w/o Chol/HDL Ratio  Result Value Ref Range   Cholesterol, Total 135 100 - 199 mg/dL   Triglycerides 106 0 - 149 mg/dL   HDL 44 >39 mg/dL   VLDL Cholesterol Cal 21 5 - 40 mg/dL   LDL Calculated 70 0 - 99 mg/dL  Microalbumin, Urine Waived  Result Value Ref Range   Microalb, Ur Waived 30 (H) 0 - 19 mg/L   Creatinine, Urine Waived 200 10 - 300 mg/dL   Microalb/Creat Ratio <30 <30 mg/g  TSH  Result Value Ref Range   TSH 0.902 0.450 -  4.500 uIU/mL  UA/M w/rflx Culture, Routine  Result Value Ref Range   Specific Gravity, UA 1.025 1.005 - 1.030   pH, UA 5.0 5.0 - 7.5   Color, UA Yellow Yellow   Appearance Ur Hazy (A) Clear   Leukocytes, UA Negative Negative   Protein, UA Negative Negative/Trace   Glucose, UA 3+ (A) Negative   Ketones, UA Trace (A) Negative   RBC,  UA Negative Negative   Bilirubin, UA Negative Negative   Urobilinogen, Ur 0.2 0.2 - 1.0 mg/dL   Nitrite, UA Negative Negative  VITAMIN D 25 Hydroxy (Vit-D Deficiency, Fractures)  Result Value Ref Range   Vit D, 25-Hydroxy 26.3 (L) 30.0 - 100.0 ng/mL  Valproic Acid level  Result Value Ref Range   Valproic Acid Lvl 68 50 - 100 ug/mL  Hepatitis C antibody  Result Value Ref Range   Hep C Virus Ab <0.1 0.0 - 0.9 s/co ratio      Assessment & Plan:   Problem List Items Addressed This Visit      Endocrine   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 Recheck 3 months, if still high, consider changing from tresiba to xultophy. Call with any concerns.       Relevant Orders   Bayer DCA Hb A1c Waived   UA/M w/rflx Culture, Routine    Other Visit Diagnoses    Burning with urination       UA Clear except glucose- no sign of yeast infection. Discussed continuing her jardiance. Call with any concerns.    Relevant Orders   UA/M w/rflx Culture, Routine       Follow up plan: Return in about 3 months (around 01/22/2019) for Physical/Wellness.

## 2018-10-22 NOTE — Assessment & Plan Note (Signed)
Doing much better with A1c of 8.5 down from 11.9- continue jardiance. Recheck 3 months, if still high, consider changing from tresiba to xultophy. Call with any concerns.

## 2018-11-18 ENCOUNTER — Telehealth: Payer: Self-pay | Admitting: Family Medicine

## 2018-11-18 NOTE — Telephone Encounter (Signed)
Brittany Sherman came in to see if she can be prescribed something else other than tresiba due to cost

## 2018-11-18 NOTE — Telephone Encounter (Signed)
Thank you :)

## 2018-11-18 NOTE — Telephone Encounter (Signed)
Pt filled out patient assistance paperwork 2 months ago but was denied because she has government prescription coverage(medicare). I will look to see if there is anything else out there to help her.

## 2018-11-18 NOTE — Telephone Encounter (Signed)
Brittany Sherman, can we see about getting her some help?

## 2018-11-20 NOTE — Telephone Encounter (Signed)
Waiting for samples to be brought in and I will notify the pt once they are in. Reminder sent to myself. Routing to close.

## 2018-11-21 NOTE — Telephone Encounter (Signed)
Patient called about samples. Message relayed from 11/20/18 entry

## 2018-12-24 ENCOUNTER — Ambulatory Visit (INDEPENDENT_AMBULATORY_CARE_PROVIDER_SITE_OTHER): Payer: Medicare Other | Admitting: Nurse Practitioner

## 2018-12-24 ENCOUNTER — Encounter: Payer: Self-pay | Admitting: Nurse Practitioner

## 2018-12-24 VITALS — BP 146/77 | HR 76 | Temp 98.0°F | Ht 66.0 in | Wt 166.2 lb

## 2018-12-24 DIAGNOSIS — B9689 Other specified bacterial agents as the cause of diseases classified elsewhere: Secondary | ICD-10-CM | POA: Insufficient documentation

## 2018-12-24 DIAGNOSIS — N760 Acute vaginitis: Secondary | ICD-10-CM | POA: Diagnosis not present

## 2018-12-24 DIAGNOSIS — B379 Candidiasis, unspecified: Secondary | ICD-10-CM | POA: Insufficient documentation

## 2018-12-24 LAB — MICROSCOPIC EXAMINATION: RBC, UA: NONE SEEN /hpf (ref 0–2)

## 2018-12-24 LAB — UA/M W/RFLX CULTURE, ROUTINE
BILIRUBIN UA: NEGATIVE
Leukocytes, UA: NEGATIVE
Nitrite, UA: NEGATIVE
PROTEIN UA: NEGATIVE
RBC UA: NEGATIVE
Specific Gravity, UA: 1.015 (ref 1.005–1.030)
UUROB: 0.2 mg/dL (ref 0.2–1.0)
pH, UA: 6 (ref 5.0–7.5)

## 2018-12-24 LAB — WET PREP FOR TRICH, YEAST, CLUE
CLUE CELL EXAM: POSITIVE — AB
Trichomonas Exam: NEGATIVE
YEAST EXAM: POSITIVE — AB

## 2018-12-24 MED ORDER — METRONIDAZOLE 500 MG PO TABS: 500.0000 mg | ORAL_TABLET | Freq: Two times a day (BID) | ORAL | 0 refills | Status: AC

## 2018-12-24 MED ORDER — FLUCONAZOLE 150 MG PO TABS: 150.0000 mg | ORAL_TABLET | Freq: Once | ORAL | 0 refills | Status: AC

## 2018-12-24 NOTE — Progress Notes (Signed)
BP (!) 146/77 (BP Location: Left Arm, Patient Position: Sitting, Cuff Size: Normal)   Pulse 76   Temp 98 F (36.7 C)   Ht 5\' 6"  (1.676 m)   Wt 166 lb 4 oz (75.4 kg)   SpO2 97%   BMI 26.83 kg/m    Subjective:    Patient ID: Brittany Sherman, female    DOB: 06-17-1946, 73 y.o.   MRN: [id number]  HPI: Brittany Sherman is a 73 y.o. female  Chief Complaint  Patient presents with  . Vaginal Itching    off and on x 6 months   VAGINAL ITCHING Has been off and on for 6 months, she reports it waxes and wanes.  She saw Dr. Wynetta Emery on 10/22/18 and wet prep was negative.  Has not taken any medications.  She has history of hysterectomy over thirty years ago.  She reports this started when she started taking Jardiance. Last A1C was 8.5 on 10/22/18, however she reports she has not been taking her Tyler Aas since last appointment due to cost.  States it would have been $160 a month, which she can not afford and that she was declined for assistance. Duration: months Discharge description: reports no discharge  Pruritus: yes, feels like it is on outside and inside Dysuria: no Malodorous: no Urinary frequency: no Fevers: no Abdominal pain: no  Sexual activity: not sexually active History of sexually transmitted diseases: no Recent antibiotic use: yes, had a boil removed prior to Christmas and was placed on abx, but does not recall which one (done at walk-in) Context: staying about the same, irritating  Treatments attempted: none  Relevant past medical, surgical, family and social history reviewed and updated as indicated. Interim medical history since our last visit reviewed. Allergies and medications reviewed and updated.  Review of Systems  Constitutional: Negative for activity change, appetite change, diaphoresis, fatigue and fever.  Respiratory: Negative for cough, chest tightness and shortness of breath.   Cardiovascular: Negative for chest pain, palpitations and leg swelling.    Gastrointestinal: Negative for abdominal distention, abdominal pain, constipation, diarrhea, nausea and vomiting.  Endocrine: Negative for cold intolerance, heat intolerance, polydipsia, polyphagia and polyuria.  Genitourinary: Positive for vaginal pain (pruritus and no pain). Negative for dysuria, frequency, hematuria, pelvic pain, urgency and vaginal discharge.  Neurological: Negative for dizziness, syncope, weakness, light-headedness, numbness and headaches.  Psychiatric/Behavioral: Negative.     Per HPI unless specifically indicated above     Objective:    BP (!) 146/77 (BP Location: Left Arm, Patient Position: Sitting, Cuff Size: Normal)   Pulse 76   Temp 98 F (36.7 C)   Ht 5\' 6"  (1.676 m)   Wt 166 lb 4 oz (75.4 kg)   SpO2 97%   BMI 26.83 kg/m   Wt Readings from Last 3 Encounters:  12/24/18 166 lb 4 oz (75.4 kg)  10/22/18 174 lb (78.9 kg)  08/09/18 180 lb (81.6 kg)    Physical Exam Vitals signs and nursing note reviewed.  Constitutional:      General: She is awake.     Appearance: She is well-developed.  HENT:     Head: Normocephalic.     Right Ear: Hearing normal.     Left Ear: Hearing normal.     Nose: Nose normal.     Mouth/Throat:     Mouth: Mucous membranes are moist.  Eyes:     General: Lids are normal.        Right eye: No discharge.  Left eye: No discharge.     Conjunctiva/sclera: Conjunctivae normal.     Pupils: Pupils are equal, round, and reactive to light.  Neck:     Musculoskeletal: Normal range of motion and neck supple.     Thyroid: No thyromegaly.     Vascular: No carotid bruit or JVD.  Cardiovascular:     Rate and Rhythm: Normal rate and regular rhythm.     Heart sounds: Normal heart sounds.  Pulmonary:     Effort: Pulmonary effort is normal.     Breath sounds: Normal breath sounds.  Abdominal:     General: Bowel sounds are normal.     Palpations: Abdomen is soft. There is no hepatomegaly or splenomegaly.  Genitourinary:    Exam  position: Supine.     Labia:        Right: No rash or tenderness.        Left: No rash or tenderness.      Vagina: Normal.     Cervix: Discharge (small thick whitish discharge at os) present. No cervical motion tenderness, erythema or cervical bleeding.  Lymphadenopathy:     Cervical: No cervical adenopathy.  Skin:    General: Skin is warm and dry.  Neurological:     Mental Status: She is alert and oriented to person, place, and time.  Psychiatric:        Attention and Perception: Attention normal.        Mood and Affect: Mood normal.        Behavior: Behavior normal. Behavior is cooperative.        Thought Content: Thought content normal.        Judgment: Judgment normal.     Results for orders placed or performed in visit on 12/24/18  WET PREP FOR [place], YEAST, CLUE  Result Value Ref Range   Trichomonas Exam Negative Negative   Yeast Exam Positive (A) Negative   Clue Cell Exam Positive (A) Negative  Microscopic Examination  Result Value Ref Range   WBC, UA 0-5 0 - 5 /hpf   RBC, UA None seen 0 - 2 /hpf   Epithelial Cells (non renal) 0-10 0 - 10 /hpf   Bacteria, UA Few (A) None seen/Few   Yeast, UA Present (A) None seen  UA/M w/rflx Culture, Routine  Result Value Ref Range   Specific Gravity, UA 1.015 1.005 - 1.030   pH, UA 6.0 5.0 - 7.5   Color, UA Yellow Yellow   Appearance Ur Clear Clear   Leukocytes, UA Negative Negative   Protein, UA Negative Negative/Trace   Glucose, UA 2+ (A) Negative   Ketones, UA Trace (A) Negative   RBC, UA Negative Negative   Bilirubin, UA Negative Negative   Urobilinogen, Ur 0.2 0.2 - 1.0 mg/dL   Nitrite, UA Negative Negative   Microscopic Examination See below:       Assessment & Plan:   Problem List Items Addressed This Visit      Genitourinary   Bacterial vaginosis - Primary    Positive clue cells on wet prep.  Flagyl script sent to pharmacy and education provided to patient on diagnosis + proper use of medication (no alcohol  and take with meals).  Return for worsening symptoms.      Relevant Medications   metroNIDAZOLE (FLAGYL) 500 MG tablet   fluconazole (DIFLUCAN) 150 MG tablet   Other Relevant Orders   WET PREP FOR [place], YEAST, CLUE (Completed)   UA/M w/rflx Culture, Routine (Completed)  Other   Yeast infection    Positive yeast on wet prep and noted in urine (remainder of UA neg nit and leu).  Diflucan dose sent to pharmacy.  Discussed and provided written education on holding Atorvastatin x 72 hours due to Diflucan dose and risk for interaction.  She verbalized this back to provider.  Return for worsening or continued symptoms.      Relevant Medications   metroNIDAZOLE (FLAGYL) 500 MG tablet   fluconazole (DIFLUCAN) 150 MG tablet       Follow up plan: Return in about 1 month (around 01/24/2019) for with Dr. Lenna Sciara for T2DM.

## 2018-12-24 NOTE — Assessment & Plan Note (Signed)
Positive yeast on wet prep and noted in urine (remainder of UA neg nit and leu).  Diflucan dose sent to pharmacy.  Discussed and provided written education on holding Atorvastatin x 72 hours due to Diflucan dose and risk for interaction.  She verbalized this back to provider.  Return for worsening or continued symptoms.

## 2018-12-24 NOTE — Patient Instructions (Addendum)
Hold Lipitor for 3 days after taking Diflucan for yeast infection.   Metronidazole tablets or capsules What is this medicine? METRONIDAZOLE (me troe NI da zole) is an antiinfective. It is used to treat certain kinds of bacterial and protozoal infections. It will not work for colds, flu, or other viral infections. This medicine may be used for other purposes; ask your health care provider or pharmacist if you have questions. COMMON BRAND NAME(S): Flagyl What should I tell my health care provider before I take this medicine? They need to know if you have any of these conditions: -Cockayne syndrome -history of blood diseases, like sickle cell anemia or leukemia -history of yeast infection -if you often drink alcohol -liver disease -an unusual or allergic reaction to metronidazole, nitroimidazoles, or other medicines, foods, dyes, or preservatives -pregnant or trying to get pregnant -breast-feeding How should I use this medicine? Take this medicine by mouth with a full glass of water. Follow the directions on the prescription label. Take your medicine at regular intervals. Do not take your medicine more often than directed. Take all of your medicine as directed even if you think you are better. Do not skip doses or stop your medicine early. Talk to your pediatrician regarding the use of this medicine in children. Special care may be needed. Overdosage: If you think you have taken too much of this medicine contact a poison control center or emergency room at once. NOTE: This medicine is only for you. Do not share this medicine with others. What if I miss a dose? If you miss a dose, take it as soon as you can. If it is almost time for your next dose, take only that dose. Do not take double or extra doses. What may interact with this medicine? Do not take this medicine with any of the following medications: -alcohol or any product that contains  alcohol -cisapride -disulfiram -dofetilide -dronedarone -pimozide -thioridazine -ziprasidone This medicine may also interact with the following medications: -amiodarone -birth control pills -busulfan -carbamazepine -cimetidine -cyclosporine -fluorouracil -lithium -other medicines that prolong the QT interval (cause an abnormal heart rhythm) -phenobarbital -phenytoin -quinidine -tacrolimus -vecuronium -warfarin This list may not describe all possible interactions. Give your health care provider a list of all the medicines, herbs, non-prescription drugs, or dietary supplements you use. Also tell them if you smoke, drink alcohol, or use illegal drugs. Some items may interact with your medicine. What should I watch for while using this medicine? Tell your doctor or health care professional if your symptoms do not improve or if they get worse. You may get drowsy or dizzy. Do not drive, use machinery, or do anything that needs mental alertness until you know how this medicine affects you. Do not stand or sit up quickly, especially if you are an older patient. This reduces the risk of dizzy or fainting spells. Ask your doctor or health care professional if you should avoid alcohol. Many nonprescription cough and cold products contain alcohol. Metronidazole can cause an unpleasant reaction when taken with alcohol. The reaction includes flushing, headache, nausea, vomiting, sweating, and increased thirst. The reaction can last from 30 minutes to several hours. If you are being treated for a sexually transmitted disease, avoid sexual contact until you have finished your treatment. Your sexual partner may also need treatment. What side effects may I notice from receiving this medicine? Side effects that you should report to your doctor or health care professional as soon as possible: -allergic reactions like skin rash or hives,  swelling of the face, lips, or tongue -confusion -fast, irregular  heartbeat -fever, chills, sore throat -fever with rash, swollen lymph nodes, or swelling of the face -pain, tingling, numbness in the hands or feet -redness, blistering, peeling or loosening of the skin, including inside the mouth -seizures -sign and symptoms of liver injury like dark yellow or brown urine; general ill feeling or flu-like symptoms; light colored stools; loss of appetite; nausea; right upper belly pain; unusually weak or tired; yellowing of the eyes or skin -vaginal discharge, itching, or odor in women Side effects that usually do not require medical attention (report to your doctor or health care professional if they continue or are bothersome): -changes in taste -diarrhea -headache -nausea, vomiting -stomach pain This list may not describe all possible side effects. Call your doctor for medical advice about side effects. You may report side effects to FDA at 1-800-FDA-1088. Where should I keep my medicine? Keep out of the reach of children. Store at room temperature below 25 degrees C (77 degrees F). Protect from light. Keep container tightly closed. Throw away any unused medicine after the expiration date. NOTE: This sheet is a summary. It may not cover all possible information. If you have questions about this medicine, talk to your doctor, pharmacist, or health care provider.  2019 Elsevier/Gold Standard (2017-03-07 20:55:23) Bacterial Vaginosis  Bacterial vaginosis is an infection of the vagina. It happens when too many normal germs (healthy bacteria) grow in the vagina. This infection puts you at risk for infections from sex (STIs). Treating this infection can lower your risk for some STIs. You should also treat this if you are pregnant. It can cause your baby to be born early. Follow these instructions at home: Medicines  Take over-the-counter and prescription medicines only as told by your doctor.  Take or use your antibiotic medicine as told by your doctor. Do  not stop taking or using it even if you start to feel better. General instructions  If you your sexual partner is a woman, tell her that you have this infection. She needs to get treatment if she has symptoms. If you have a female partner, he does not need to be treated.  During treatment: ? Avoid sex. ? Do not douche. ? Avoid alcohol as told. ? Avoid breastfeeding as told.  Drink enough fluid to keep your pee (urine) clear or pale yellow.  Keep your vagina and butt (rectum) clean. ? Wash the area with warm water every day. ? Wipe from front to back after you use the toilet.  Keep all follow-up visits as told by your doctor. This is important. Preventing this condition  Do not douche.  Use only warm water to wash around your vagina.  Use protection when you have sex. This includes: ? Latex condoms. ? Dental dams.  Limit how many people you have sex with. It is best to only have sex with the same person (be monogamous).  Get tested for STIs. Have your partner get tested.  Wear underwear that is cotton or lined with cotton.  Avoid tight pants and pantyhose. This is most important in summer.  Do not use any products that have nicotine or tobacco in them. These include cigarettes and e-cigarettes. If you need help quitting, ask your doctor.  Do not use illegal drugs.  Limit how much alcohol you drink. Contact a doctor if:  Your symptoms do not get better, even after you are treated.  You have more discharge or pain when you  pee (urinate).  You have a fever.  You have pain in your belly (abdomen).  You have pain with sex.  Your bleed from your vagina between periods. Summary  This infection happens when too many germs (bacteria) grow in the vagina.  Treating this condition can lower your risk for some infections from sex (STIs).  You should also treat this if you are pregnant. It can cause early (premature) birth.  Do not stop taking or using your antibiotic  medicine even if you start to feel better. This information is not intended to replace advice given to you by your health care provider. Make sure you discuss any questions you have with your health care provider. Document Released: 09/12/2008 Document Revised: 08/19/2016 Document Reviewed: 08/19/2016 Elsevier Interactive Patient Education  2019 [employer].

## 2018-12-24 NOTE — Assessment & Plan Note (Signed)
Positive clue cells on wet prep.  Flagyl script sent to pharmacy and education provided to patient on diagnosis + proper use of medication (no alcohol and take with meals).  Return for worsening symptoms.

## 2019-01-16 ENCOUNTER — Other Ambulatory Visit: Payer: Self-pay | Admitting: Family Medicine

## 2019-01-16 NOTE — Telephone Encounter (Signed)
Patient has appointment 01/27/19 Requested Prescriptions  Pending Prescriptions Disp Refills  . quinapril-hydrochlorothiazide (ACCURETIC) 20-25 MG tablet [Pharmacy Med Name: Quinapril-hydroCHLOROthiazide 20-25 MG Oral Tablet] 90 tablet 0    Sig: TAKE 1 TABLET BY MOUTH ONCE DAILY     Cardiovascular:  ACEI + Diuretic Combos Failed - 01/16/2019 11:20 AM      Failed - Na in normal range and within 180 days    Sodium  Date Value Ref Range Status  07/18/2018 139 134 - 144 mmol/L Final  03/26/2013 138 136 - 145 mmol/L Final         Failed - K in normal range and within 180 days    Potassium  Date Value Ref Range Status  07/18/2018 4.1 3.5 - 5.2 mmol/L Final  03/26/2013 4.0 3.5 - 5.1 mmol/L Final         Failed - Cr in normal range and within 180 days    Creatinine  Date Value Ref Range Status  03/26/2013 0.91 0.60 - 1.30 mg/dL Final   Creatinine, Ser  Date Value Ref Range Status  07/18/2018 0.78 0.57 - 1.00 mg/dL Final         Failed - Ca in normal range and within 180 days    Calcium  Date Value Ref Range Status  07/18/2018 9.8 8.7 - 10.3 mg/dL Final   Calcium, Total  Date Value Ref Range Status  03/26/2013 9.1 8.5 - 10.1 mg/dL Final         Failed - Last BP in normal range    BP Readings from Last 1 Encounters:  12/24/18 (!) 146/77         Passed - Patient is not pregnant      Passed - Valid encounter within last 6 months    Recent Outpatient Visits          3 weeks ago Bacterial vaginosis   [facility] [place], [place] T, NP   2 months ago Uncontrolled type 2 diabetes with neuropathy ([place])   [place], Megan P, DO   6 months ago Uncontrolled type 2 diabetes with neuropathy ([place])   [place], Megan P, DO   10 months ago Uncontrolled type 2 diabetes with neuropathy ([place])   [place], Megan P, DO   11 months ago Uncontrolled type 2 diabetes with neuropathy ([place])   [place], Megan P, DO      Future Appointments            In 1 week Johnson, Barb Merino, DO [place], [place]   In 6 months  [employer], [place]

## 2019-01-16 NOTE — Telephone Encounter (Signed)
Closing encounter

## 2019-01-22 ENCOUNTER — Other Ambulatory Visit: Payer: Self-pay | Admitting: Family Medicine

## 2019-01-22 MED ORDER — EMPAGLIFLOZIN 25 MG PO TABS: 25.0000 mg | ORAL_TABLET | Freq: Every day | ORAL | 1 refills | Status: DC

## 2019-01-22 NOTE — Telephone Encounter (Signed)
Copied from [place] [phone]. Topic: Quick Communication - Rx Refill/Question >> Jan 22, 2019  1:35 PM Mcneil, Ja-Kwan wrote: Medication: empagliflozin (JARDIANCE) 25 MG TABS tablet  Has the patient contacted their pharmacy? yes   Preferred Pharmacy (with phone number or street name): [facility] (N), [place] - [place] [phone] (Phone)  [phone] (Fax)  Agent: Please be advised that RX refills may take up to 3 business days. We ask that you follow-up with your pharmacy.

## 2019-01-27 ENCOUNTER — Other Ambulatory Visit: Payer: Self-pay | Admitting: Family Medicine

## 2019-01-27 ENCOUNTER — Encounter: Payer: Medicare Other | Admitting: Family Medicine

## 2019-04-23 ENCOUNTER — Other Ambulatory Visit: Payer: Self-pay | Admitting: Family Medicine

## 2019-04-23 NOTE — Telephone Encounter (Signed)
Requested Prescriptions  Pending Prescriptions Disp Refills  . glimepiride (AMARYL) 4 MG tablet [Pharmacy Med Name: Glimepiride 4 MG Oral Tablet] 180 tablet 0    Sig: Take 1 tablet by mouth twice daily     Endocrinology:  Diabetes - Sulfonylureas Failed - 04/23/2019  9:26 AM      Failed - HBA1C is between 0 and 7.9 and within 180 days    Hemoglobin A1C  Date Value Ref Range Status  02/01/2017 11.0  Final   HB A1C (BAYER DCA - WAIVED)  Date Value Ref Range Status  10/22/2018 8.5 (H) <7.0 % Final    Comment:                                          Diabetic Adult            <7.0                                       Healthy Adult        4.3 - 5.7                                                           (DCCT/NGSP) American Diabetes Association's Summary of Glycemic Recommendations for Adults with Diabetes: Hemoglobin A1c <7.0%. More stringent glycemic goals (A1c <6.0%) may further reduce complications at the cost of increased risk of hypoglycemia.          Passed - Valid encounter within last 6 months    Recent Outpatient Visits          4 months ago Bacterial vaginosis   [facility] [place], [place] T, NP   6 months ago Uncontrolled type 2 diabetes with neuropathy [facility])   [place], Megan P, DO   9 months ago Uncontrolled type 2 diabetes with neuropathy [facility])   [place], Megan P, DO   1 year ago Uncontrolled type 2 diabetes with neuropathy ([place])   [place], Megan P, DO   1 year ago Uncontrolled type 2 diabetes with neuropathy ([place])   [place], Megan P, DO      Future Appointments            In 3 months [place], [facility]          . amLODipine ([place]) 5 MG tablet [Pharmacy Med Name: amLODIPine Besylate 5 MG Oral Tablet] 90 tablet 0    Sig: Take 1 tablet by mouth once daily     Cardiovascular:  Calcium Channel Blockers Failed - 04/23/2019  9:26 AM     Failed - Last BP in normal range    BP Readings from Last 1 Encounters:  12/24/18 (!) 146/77         Passed - Valid encounter within last 6 months    Recent Outpatient Visits          4 months ago Bacterial vaginosis   [facility] [place], [place] T, NP   6 months ago Uncontrolled type 2 diabetes with neuropathy ([place])   [place],  Megan P, DO   9 months ago Uncontrolled type 2 diabetes with neuropathy [facility])   [place], Megan P, DO   1 year ago Uncontrolled type 2 diabetes with neuropathy [facility])   [place], Megan P, DO   1 year ago Uncontrolled type 2 diabetes with neuropathy [facility])   [place], Megan P, DO      Future Appointments            In 3 months [place], [facility]

## 2019-05-09 ENCOUNTER — Other Ambulatory Visit: Payer: Self-pay | Admitting: Family Medicine

## 2019-05-28 ENCOUNTER — Other Ambulatory Visit: Payer: Self-pay | Admitting: Family Medicine

## 2019-05-28 DIAGNOSIS — Z1231 Encounter for screening mammogram for malignant neoplasm of breast: Secondary | ICD-10-CM

## 2019-07-10 ENCOUNTER — Encounter: Payer: Self-pay | Admitting: Family Medicine

## 2019-07-10 ENCOUNTER — Ambulatory Visit
Admission: RE | Admit: 2019-07-10 | Discharge: 2019-07-10 | Disposition: A | Discharge status: Home / Self Care | Payer: Medicare Other | Source: Ambulatory Visit | Attending: Family Medicine | Admitting: Family Medicine

## 2019-07-10 DIAGNOSIS — Z1231 Encounter for screening mammogram for malignant neoplasm of breast: Secondary | ICD-10-CM | POA: Insufficient documentation

## 2019-07-23 ENCOUNTER — Ambulatory Visit: Payer: Medicare Other

## 2019-07-29 ENCOUNTER — Other Ambulatory Visit: Payer: Self-pay | Admitting: Family Medicine

## 2019-07-29 NOTE — Telephone Encounter (Signed)
Will provide 30 days, but patient needs follow-up scheduled with Dr. Wynetta Emery for further refills.

## 2019-07-29 NOTE — Telephone Encounter (Signed)
Requested medications are due for refill today?  Yes  Requested medications are on the active medication list?  Yes  Last refill: 04/23/2019, #90, 0 Refills   Future visit scheduled?  No  Notes to clinic: Attempted to contact patient to advise her she is due for follow up appointment.  No answer, no voicemail available.  Requested Prescriptions  Pending Prescriptions Disp Refills   amLODipine (NORVASC) 5 MG tablet [Pharmacy Med Name: amLODIPine Besylate 5 MG Oral Tablet] 90 tablet 0    Sig: Take 1 tablet by mouth once daily     Cardiovascular:  Calcium Channel Blockers Failed - 07/29/2019 12:15 PM      Failed - Last BP in normal range    BP Readings from Last 1 Encounters:  12/24/18 (!) 146/77         Failed - Valid encounter within last 6 months    Recent Outpatient Visits          7 months ago Bacterial vaginosis   [facility] [place], [place] T, NP   9 months ago Uncontrolled type 2 diabetes with neuropathy [facility])   [place], Megan P, DO   1 year ago Uncontrolled type 2 diabetes with neuropathy ([place])   [place], Megan P, DO   1 year ago Uncontrolled type 2 diabetes with neuropathy ([place])   [place], Megan P, DO   1 year ago Uncontrolled type 2 diabetes with neuropathy [facility])   [place], Megan P, DO

## 2019-07-29 NOTE — Telephone Encounter (Signed)
Routing to provider  

## 2019-07-29 NOTE — Telephone Encounter (Signed)
For further refills needs to see provider.

## 2019-07-30 NOTE — Telephone Encounter (Signed)
Called patient's number and a lady answered who said that the patient was not in. She stated that she will let the patient know that we called so patient can call us back.

## 2019-08-20 ENCOUNTER — Telehealth: Payer: Self-pay | Admitting: *Deleted

## 2019-08-20 NOTE — Telephone Encounter (Signed)
Patient has been notified that lung cancer screening CT scan is due currently or will be in the near future. Confirmed that patient is within appropriate age range and asymptomatic (no signs or symptoms of lung cancer). Patient denies any significant change in medical history or illness that would prevent curative treatment for lung cancer if found. Verified smoking history (Former smoker 1.5 ppd). Patient states she cares for her husband and does not know the best time to have her scan scheduled. She would like to call back at later time to schedule her scan, [phone] given [phone].

## 2019-10-22 ENCOUNTER — Other Ambulatory Visit: Payer: Self-pay | Admitting: Family Medicine

## 2019-10-22 NOTE — Telephone Encounter (Signed)
Requested medication (s) are due for refill today: yes  Requested medication (s) are on the active medication list: yes  Last refill:  07/13/2019  Future visit scheduled: no  Notes to clinic: review for refill overdue for office visit    Requested Prescriptions  Pending Prescriptions Disp Refills   quinapril-hydrochlorothiazide (ACCURETIC) 20-25 MG tablet [Pharmacy Med Name: Quinapril-hydroCHLOROthiazide 20-25 MG Oral Tablet] 90 tablet 0    Sig: TAKE 1 TABLET BY MOUTH ONCE DAILY     Cardiovascular:  ACEI + Diuretic Combos Failed - 10/22/2019 11:57 AM      Failed - Na in normal range and within 180 days    Sodium  Date Value Ref Range Status  07/18/2018 139 134 - 144 mmol/L Final  03/26/2013 138 136 - 145 mmol/L Final         Failed - K in normal range and within 180 days    Potassium  Date Value Ref Range Status  07/18/2018 4.1 3.5 - 5.2 mmol/L Final  03/26/2013 4.0 3.5 - 5.1 mmol/L Final         Failed - Cr in normal range and within 180 days    Creatinine  Date Value Ref Range Status  03/26/2013 0.91 0.60 - 1.30 mg/dL Final   Creatinine, Ser  Date Value Ref Range Status  07/18/2018 0.78 0.57 - 1.00 mg/dL Final         Failed - Ca in normal range and within 180 days    Calcium  Date Value Ref Range Status  07/18/2018 9.8 8.7 - 10.3 mg/dL Final   Calcium, Total  Date Value Ref Range Status  03/26/2013 9.1 8.5 - 10.1 mg/dL Final         Failed - Last BP in normal range    BP Readings from Last 1 Encounters:  12/24/18 (!) 146/77         Failed - Valid encounter within last 6 months    Recent Outpatient Visits          10 months ago Bacterial vaginosis   [place], [place] T, NP   1 year ago Uncontrolled type 2 diabetes with neuropathy ([place])   [place], Megan P, DO   1 year ago Uncontrolled type 2 diabetes with neuropathy ([place])   [place], Megan P, DO   1 year ago Uncontrolled type 2  diabetes with neuropathy ([place])   [place], Megan P, DO   1 year ago Uncontrolled type 2 diabetes with neuropathy [facility])   [place], [place], DO             Passed - Patient is not pregnant

## 2019-11-07 ENCOUNTER — Telehealth: Payer: Self-pay | Admitting: *Deleted

## 2019-11-07 DIAGNOSIS — Z87891 Personal history of nicotine dependence: Secondary | ICD-10-CM

## 2019-11-07 DIAGNOSIS — Z122 Encounter for screening for malignant neoplasm of respiratory organs: Secondary | ICD-10-CM

## 2019-11-07 NOTE — Telephone Encounter (Signed)
Patient has been notified that annual lung cancer screening low dose CT scan is due currently or will be in near future. Confirmed that patient is within the age range of 55-77, and asymptomatic, (no signs or symptoms of lung cancer). Patient denies illness that would prevent curative treatment for lung cancer if found. Verified smoking history, (former, quit 2008, 64.5 pack year). The shared decision making visit was done 08/09/18. Patient is agreeable for CT scan being scheduled.

## 2019-11-11 ENCOUNTER — Ambulatory Visit
Admission: RE | Admit: 2019-11-11 | Discharge: 2019-11-11 | Disposition: A | Discharge status: Home / Self Care | Payer: Medicare Other | Source: Ambulatory Visit | Attending: Nurse Practitioner | Admitting: Nurse Practitioner

## 2019-11-11 ENCOUNTER — Other Ambulatory Visit: Payer: Self-pay

## 2019-11-11 DIAGNOSIS — Z87891 Personal history of nicotine dependence: Secondary | ICD-10-CM | POA: Diagnosis present

## 2019-11-11 DIAGNOSIS — Z122 Encounter for screening for malignant neoplasm of respiratory organs: Secondary | ICD-10-CM | POA: Diagnosis not present

## 2019-11-17 ENCOUNTER — Encounter: Payer: Self-pay | Admitting: *Deleted

## 2019-12-09 ENCOUNTER — Other Ambulatory Visit: Payer: Self-pay | Admitting: Family Medicine

## 2019-12-09 NOTE — Telephone Encounter (Signed)
Requested medication (s) are due for refill today: yes  Requested medication (s) are on the active medication list: yes  Last refill:  07/13/2019  Future visit scheduled: no  Notes to clinic:  LOV-10/22/2018 Review for refill   Requested Prescriptions  Pending Prescriptions Disp Refills   quinapril-hydrochlorothiazide (ACCURETIC) 20-25 MG tablet [Pharmacy Med Name: Quinapril-hydroCHLOROthiazide 20-25 MG Oral Tablet] 90 tablet 0    Sig: TAKE 1 TABLET BY MOUTH ONCE DAILY      Cardiovascular:  ACEI + Diuretic Combos Failed - 12/09/2019  9:47 AM      Failed - Na in normal range and within 180 days    Sodium  Date Value Ref Range Status  07/18/2018 139 134 - 144 mmol/L Final  03/26/2013 138 136 - 145 mmol/L Final          Failed - K in normal range and within 180 days    Potassium  Date Value Ref Range Status  07/18/2018 4.1 3.5 - 5.2 mmol/L Final  03/26/2013 4.0 3.5 - 5.1 mmol/L Final          Failed - Cr in normal range and within 180 days    Creatinine  Date Value Ref Range Status  03/26/2013 0.91 0.60 - 1.30 mg/dL Final   Creatinine, Ser  Date Value Ref Range Status  07/18/2018 0.78 0.57 - 1.00 mg/dL Final          Failed - Ca in normal range and within 180 days    Calcium  Date Value Ref Range Status  07/18/2018 9.8 8.7 - 10.3 mg/dL Final   Calcium, Total  Date Value Ref Range Status  03/26/2013 9.1 8.5 - 10.1 mg/dL Final          Failed - Last BP in normal range    BP Readings from Last 1 Encounters:  12/24/18 (!) 146/77          Failed - Valid encounter within last 6 months    Recent Outpatient Visits           11 months ago Bacterial vaginosis   [place], [place] T, NP   1 year ago Uncontrolled type 2 diabetes with neuropathy ([place])   [place], Megan P, DO   1 year ago Uncontrolled type 2 diabetes with neuropathy ([place])   [place], Megan P, DO   1 year ago Uncontrolled type 2  diabetes with neuropathy ([place])   [place], Megan P, DO   1 year ago Uncontrolled type 2 diabetes with neuropathy [facility])   [place], [place], DO              Passed - Patient is not pregnant

## 2020-01-02 ENCOUNTER — Encounter: Payer: Self-pay | Admitting: Podiatry

## 2020-01-02 ENCOUNTER — Ambulatory Visit: Payer: Medicare Other | Admitting: Podiatry

## 2020-01-02 ENCOUNTER — Other Ambulatory Visit: Payer: Self-pay

## 2020-01-02 DIAGNOSIS — M79676 Pain in unspecified toe(s): Secondary | ICD-10-CM

## 2020-01-02 DIAGNOSIS — E0843 Diabetes mellitus due to underlying condition with diabetic autonomic (poly)neuropathy: Secondary | ICD-10-CM | POA: Diagnosis not present

## 2020-01-02 DIAGNOSIS — B351 Tinea unguium: Secondary | ICD-10-CM

## 2020-01-04 NOTE — Progress Notes (Signed)
   SUBJECTIVE Patient with a history of diabetes mellitus presents to office today complaining of elongated, thickened nails that cause pain while ambulating in shoes. She is unable to trim her own nails. Patient is here for further evaluation and treatment.   Past Medical History:  Diagnosis Date  . Arthritis   . Benign neoplasm of rectum and anal canal   . Diabetes mellitus without complication ([place])   . History of vertigo   . Hypertension   . Immune deficiency disorder ([place])   . Infectious colitis, enteritis and gastroenteritis   . Partial epilepsy with impairment of consciousness ([place])   . Seizures ([place])   . Stroke ([place])   . Trigger finger of right thumb   . Vitamin D deficiency     OBJECTIVE General Patient is awake, alert, and oriented x 3 and in no acute distress. Derm Skin is dry and supple bilateral. Negative open lesions or macerations. Remaining integument unremarkable. Nails are tender, long, thickened and dystrophic with subungual debris, consistent with onychomycosis, 1-5 bilateral. No signs of infection noted. Vasc  DP and PT pedal pulses palpable bilaterally. Temperature gradient within normal limits.  Neuro Epicritic and protective threshold sensation diminished bilaterally.  Musculoskeletal Exam No symptomatic pedal deformities noted bilateral. Muscular strength within normal limits.  ASSESSMENT 1. Diabetes Mellitus w/ peripheral neuropathy 2. Onychomycosis of nail due to dermatophyte bilateral 3. Pain in foot bilateral  PLAN OF CARE 1. Patient evaluated today. 2. Instructed to maintain good pedal hygiene and foot care. Stressed importance of controlling blood sugar.  3. Mechanical debridement of nails 1-5 bilaterally performed using a nail nipper. Filed with dremel without incident.  4. Return to clinic as needed.     Edrick Kins, DPM [facility]  Dr. Edrick Kins, [place]                                         [place], [place] [address]                Office [phone]  Fax [phone]

## 2020-05-08 ENCOUNTER — Other Ambulatory Visit: Payer: Self-pay

## 2020-05-08 ENCOUNTER — Encounter: Payer: Self-pay | Admitting: Emergency Medicine

## 2020-05-08 ENCOUNTER — Emergency Department
Admission: EM | Admit: 2020-05-08 | Discharge: 2020-05-08 | Disposition: A | Discharge status: Home / Self Care | Payer: Medicare Other | Attending: Emergency Medicine | Admitting: Emergency Medicine

## 2020-05-08 DIAGNOSIS — I129 Hypertensive chronic kidney disease with stage 1 through stage 4 chronic kidney disease, or unspecified chronic kidney disease: Secondary | ICD-10-CM | POA: Diagnosis not present

## 2020-05-08 DIAGNOSIS — E1122 Type 2 diabetes mellitus with diabetic chronic kidney disease: Secondary | ICD-10-CM | POA: Diagnosis not present

## 2020-05-08 DIAGNOSIS — Z794 Long term (current) use of insulin: Secondary | ICD-10-CM | POA: Diagnosis not present

## 2020-05-08 DIAGNOSIS — T7840XA Allergy, unspecified, initial encounter: Secondary | ICD-10-CM | POA: Diagnosis present

## 2020-05-08 DIAGNOSIS — T781XXA Other adverse food reactions, not elsewhere classified, initial encounter: Secondary | ICD-10-CM | POA: Insufficient documentation

## 2020-05-08 DIAGNOSIS — N183 Chronic kidney disease, stage 3 unspecified: Secondary | ICD-10-CM | POA: Insufficient documentation

## 2020-05-08 DIAGNOSIS — Z87891 Personal history of nicotine dependence: Secondary | ICD-10-CM | POA: Insufficient documentation

## 2020-05-08 MED ORDER — DIPHENHYDRAMINE HCL 25 MG PO CAPS
25.0000 mg | ORAL_CAPSULE | Freq: Once | ORAL | Status: AC
  Administered 2020-05-08: 25 mg via ORAL
  Filled 2020-05-08: qty 1

## 2020-05-08 MED ORDER — PREDNISONE 50 MG PO TABS: 50.0000 mg | ORAL_TABLET | Freq: Every day | ORAL | 0 refills | Status: DC

## 2020-05-08 MED ORDER — PREDNISONE 20 MG PO TABS
60.0000 mg | ORAL_TABLET | Freq: Once | ORAL | Status: AC
  Administered 2020-05-08: 60 mg via ORAL
  Filled 2020-05-08: qty 3

## 2020-05-08 MED ORDER — FAMOTIDINE 20 MG PO TABS
20.0000 mg | ORAL_TABLET | Freq: Once | ORAL | Status: AC
  Administered 2020-05-08: 20 mg via ORAL
  Filled 2020-05-08: qty 1

## 2020-05-08 NOTE — ED Triage Notes (Signed)
Pt arrives ambulatory to ED with c/o allergic reaction to a seafood boil that she ate around 1730. Pt has slight rash to face and no previous hx of allergic reaction to seafood. Pt is able to speak in complete sentences, manage secretions, and has no visible swelling to tongue or mouth. Pt is in NAD.

## 2020-05-08 NOTE — ED Provider Notes (Signed)
[facility] Emergency Department Provider Note  ____________________________________________  Time seen: Approximately 8:12 PM  I have reviewed the triage vital signs and the nursing notes.   HISTORY  Chief Complaint Allergic Reaction    HPI Brittany Sherman is a 74 y.o. female who presents the emergency department complaining of allergic reaction.  Patient states that she was eating seafood, which she typically eats without problems.  Patient states that she had an immediate flush feeling in her face and noticed that she had large "welts."  Appeared on her face.  These occurred across the face,  but had no periorbital, periocular involvement.  Patient denies any sensation of her throat closing.  No wheezing.  No other complaints at this time.  She does not take any medication prior to arrival.  She denies any new make-up, new products, soaps or shampoos, laundry detergent.  She states that this began suddenly after eating crab legs.        Past Medical History:  Diagnosis Date  . Arthritis   . Benign neoplasm of rectum and anal canal   . Diabetes mellitus without complication ([place])   . History of vertigo   . Hypertension   . Immune deficiency disorder ([place])   . Infectious colitis, enteritis and gastroenteritis   . Partial epilepsy with impairment of consciousness ([place])   . Seizures ([place])   . Stroke ([place])   . Trigger finger of right thumb   . Vitamin D deficiency     Patient Active Problem List   Diagnosis Date Noted  . Yeast infection 12/24/2018  . Aortic atherosclerosis ([place]) 08/12/2018  . Adrenal nodule ([place]) 08/12/2018  . Baker's cyst of knee, left 08/13/2017  . Traumatic avulsion of nail plate of finger [id number]  . Osteoarthritis 02/27/2017  . CKD (chronic kidney disease), stage III 02/27/2017  . Hyperlipidemia associated with type 2 diabetes mellitus ([place]) 02/27/2017  . Benign hypertensive renal disease   . Uncontrolled type 2 diabetes  with neuropathy ([place])   . History of stroke without residual deficits   . Vitamin D deficiency   . Trigger finger of right thumb   . Benign neoplasm of colon   . Partial epilepsy with impairment of consciousness [facility])     Past Surgical History:  Procedure Laterality Date  . ABDOMINAL HYSTERECTOMY    . COLONOSCOPY WITH PROPOFOL N/A 07/24/2017   Procedure: COLONOSCOPY WITH PROPOFOL;  Surgeon: Jonathon Bellows, MD;  Location: [facility] ENDOSCOPY;  Service: Endoscopy;  Laterality: N/A;  . OOPHORECTOMY    . SPINE SURGERY    . TOTAL HIP ARTHROPLASTY  2017    Prior to Admission medications   Medication Sig Start Date End Date Taking? Authorizing Provider  amLODipine (NORVASC) 5 MG tablet Take 1 tablet (5 mg total) by mouth daily. For further refills needs to see provider. 07/29/19   Cannady, Henrine Screws T, NP  atorvastatin (LIPITOR) 40 MG tablet TAKE 1 TABLET BY MOUTH ONCE DAILY IN THE MORNING 05/09/19   Johnson, Megan P, DO  brimonidine (ALPHAGAN) 0.2 % ophthalmic solution 1 drop 2 (two) times daily. 10/21/19   [provider]  divalproex (DEPAKOTE) 250 MG DR tablet Take 250 mg by mouth 2 (two) times daily. 09/16/15   [provider]  empagliflozin (JARDIANCE) 25 MG TABS tablet Take 25 mg by mouth daily. 01/22/19   Park Liter P, DO  glimepiride (AMARYL) 4 MG tablet Take 1 tablet by mouth twice daily 04/23/19   Park Liter P, DO  insulin  degludec (TRESIBA FLEXTOUCH) 100 UNIT/ML SOPN FlexTouch Pen Inject 0.3 mLs (30 Units total) into the skin daily at 10 pm. 07/18/18   Wynetta Emery, Megan P, DO  Insulin Pen Needle (PEN NEEDLES) 32G X 6 MM MISC 1 each by Does not apply route daily. 07/18/18   Johnson, Megan P, DO  lamoTRIgine (LAMICTAL) 100 MG tablet Take 100 mg by mouth 2 (two) times daily. 09/23/15   [provider]  lamoTRIgine (LAMICTAL) 150 MG tablet Take 150 mg by mouth daily. 11/18/19   [provider]  predniSONE (DELTASONE) 50 MG tablet Take 1 tablet (50 mg total) by mouth daily  with breakfast. 05/08/20   Mina Babula, Charline Bills, PA-C  quinapril-hydrochlorothiazide (ACCURETIC) 20-25 MG tablet TAKE 1 TABLET BY MOUTH ONCE DAILY 01/16/19   Johnson, Megan P, DO  Vitamin D, Ergocalciferol, (DRISDOL) 50000 units CAPS capsule Take 1 capsule (50,000 Units total) by mouth every 7 (seven) days. 07/19/18   Volney American, PA-C    Allergies Aspirin, Metformin and related, and Penicillins  Family History  Problem Relation Age of Onset  . Brain cancer Mother   . Esophageal cancer Sister   . Aneurysm Sister   . Cancer Sister   . Breast cancer Neg Hx     Social History Social History   Tobacco Use  . Smoking status: Former Smoker    Packs/day: 1.50    Years: 43.00    Pack years: 64.50    Types: Cigarettes    Quit date: 12/18/2006    Years since quitting: 13.3  . Smokeless tobacco: Never Used  Substance Use Topics  . Alcohol use: No  . Drug use: No     Review of Systems  Constitutional: No fever/chills Eyes: No visual changes. No discharge ENT: No upper respiratory complaints. Cardiovascular: no chest pain. Respiratory: no cough. No SOB. Gastrointestinal: No abdominal pain.  No nausea, no vomiting.  No diarrhea.  No constipation. Genitourinary: Negative for dysuria. No hematuria Musculoskeletal: Negative for musculoskeletal pain. Skin: Hives to the face Neurological: Negative for headaches, focal weakness or numbness. 10-point ROS otherwise negative.  ____________________________________________   PHYSICAL EXAM:  VITAL SIGNS: ED Triage Vitals  Enc Vitals Group     BP 05/08/20 1935 115/69     Pulse Rate 05/08/20 1935 84     Resp 05/08/20 1935 18     Temp 05/08/20 1935 97.9 F (36.6 C)     Temp Source 05/08/20 1935 Oral     SpO2 05/08/20 1935 96 %     Weight 05/08/20 1936 172 lb (78 kg)     Height 05/08/20 1936 5\' 5"  (1.651 m)     Head Circumference --      Peak Flow --      Pain Score 05/08/20 1935 0     Pain Loc --      Pain Edu? --       Excl. in [place]? --      Constitutional: Alert and oriented. Well appearing and in no acute distress. Eyes: Conjunctivae are normal. PERRL. EOMI. Head: Atraumatic. ENT:      Ears:       Nose: No congestion/rhinnorhea.      Mouth/Throat: Mucous membranes are moist.  Neck: No stridor.    Cardiovascular: Normal rate, regular rhythm. Normal S1 and S2.  Good peripheral circulation. Respiratory: Normal respiratory effort without tachypnea or retractions. Lungs CTAB. Good air entry to the bases with no decreased or absent breath sounds. Musculoskeletal: Full range of motion to  all extremities. No gross deformities appreciated. Neurologic:  Normal speech and language. No gross focal neurologic deficits are appreciated.  Skin:  Skin is warm, dry and intact.  Scattered hives noted to bilateral cheeks.  No periorbital or ocular involvement. Psychiatric: Mood and affect are normal. Speech and behavior are normal. Patient exhibits appropriate insight and judgement.   ____________________________________________   LABS (all labs ordered are listed, but only abnormal results are displayed)  Labs Reviewed - No data to display ____________________________________________  EKG   ____________________________________________  RADIOLOGY   No results found.  ____________________________________________    PROCEDURES  Procedure(s) performed:    Procedures    Medications  diphenhydrAMINE (BENADRYL) capsule 25 mg (has no administration in time range)  predniSONE (DELTASONE) tablet 60 mg (has no administration in time range)  famotidine (PEPCID) tablet 20 mg (has no administration in time range)     ____________________________________________   INITIAL IMPRESSION / ASSESSMENT AND PLAN / ED COURSE  Pertinent labs & imaging results that were available during my care of the patient were reviewed by me and considered in my medical decision making (see chart for details).  Review of  the  [place] was performed in accordance of the [place] prior to dispensing any controlled drugs.           Patient's diagnosis is consistent with allergic reaction to food.  Patient presented to emergency department complaining of hives to both cheeks.  No perioral or periocular involvement noted.  No angioedema.  Patient is given prednisone, Benadryl, famotidine here in the emergency department.. Patient will be discharged home with prescriptions for prednisone and instructions to use antihistamine as needed at home.. Patient is to follow up with primary care to discuss allergy testing as needed or otherwise directed. Patient is given ED precautions to return to the ED for any worsening or new symptoms.     ____________________________________________  FINAL CLINICAL IMPRESSION(S) / ED DIAGNOSES  Final diagnoses:  Allergic reaction to food, initial encounter      NEW MEDICATIONS STARTED DURING THIS VISIT:  ED Discharge Orders         Ordered    predniSONE (DELTASONE) 50 MG tablet  Daily with breakfast     05/08/20 2019              This chart was dictated using voice recognition software/Dragon. Despite best efforts to proofread, errors can occur which can change the meaning. Any change was purely unintentional.    Darletta Moll, PA-C 05/08/20 2021    Nance Pear, MD 05/08/20 2025

## 2020-06-09 ENCOUNTER — Other Ambulatory Visit: Payer: Self-pay

## 2020-06-09 ENCOUNTER — Encounter: Payer: Self-pay | Admitting: Emergency Medicine

## 2020-06-09 ENCOUNTER — Emergency Department
Admission: EM | Admit: 2020-06-09 | Discharge: 2020-06-09 | Disposition: A | Discharge status: Home / Self Care | Payer: Medicare Other | Attending: Emergency Medicine | Admitting: Emergency Medicine

## 2020-06-09 DIAGNOSIS — Z794 Long term (current) use of insulin: Secondary | ICD-10-CM | POA: Diagnosis not present

## 2020-06-09 DIAGNOSIS — Z79899 Other long term (current) drug therapy: Secondary | ICD-10-CM | POA: Diagnosis not present

## 2020-06-09 DIAGNOSIS — E1122 Type 2 diabetes mellitus with diabetic chronic kidney disease: Secondary | ICD-10-CM | POA: Insufficient documentation

## 2020-06-09 DIAGNOSIS — R21 Rash and other nonspecific skin eruption: Secondary | ICD-10-CM | POA: Diagnosis present

## 2020-06-09 DIAGNOSIS — Z87891 Personal history of nicotine dependence: Secondary | ICD-10-CM | POA: Diagnosis not present

## 2020-06-09 DIAGNOSIS — I129 Hypertensive chronic kidney disease with stage 1 through stage 4 chronic kidney disease, or unspecified chronic kidney disease: Secondary | ICD-10-CM | POA: Insufficient documentation

## 2020-06-09 DIAGNOSIS — Z880 Allergy status to penicillin: Secondary | ICD-10-CM | POA: Insufficient documentation

## 2020-06-09 DIAGNOSIS — N183 Chronic kidney disease, stage 3 unspecified: Secondary | ICD-10-CM | POA: Diagnosis not present

## 2020-06-09 DIAGNOSIS — Z882 Allergy status to sulfonamides status: Secondary | ICD-10-CM | POA: Insufficient documentation

## 2020-06-09 DIAGNOSIS — E1140 Type 2 diabetes mellitus with diabetic neuropathy, unspecified: Secondary | ICD-10-CM | POA: Diagnosis not present

## 2020-06-09 DIAGNOSIS — E1165 Type 2 diabetes mellitus with hyperglycemia: Secondary | ICD-10-CM

## 2020-06-09 LAB — GLUCOSE, CAPILLARY: Glucose-Capillary: 322 mg/dL — ABNORMAL HIGH (ref 70–99)

## 2020-06-09 MED ORDER — DEXAMETHASONE SODIUM PHOSPHATE 10 MG/ML IJ SOLN
10.0000 mg | Freq: Once | INTRAMUSCULAR | Status: AC
  Administered 2020-06-09: 10 mg via INTRAMUSCULAR
  Filled 2020-06-09: qty 1

## 2020-06-09 MED ORDER — HYDROXYZINE HCL 25 MG PO TABS: 25.0000 mg | ORAL_TABLET | Freq: Four times a day (QID) | ORAL | 0 refills | Status: DC | PRN

## 2020-06-09 MED ORDER — DIPHENHYDRAMINE HCL 25 MG PO CAPS
25.0000 mg | ORAL_CAPSULE | Freq: Once | ORAL | Status: AC
  Administered 2020-06-09: 25 mg via ORAL
  Filled 2020-06-09: qty 1

## 2020-06-09 NOTE — Discharge Instructions (Signed)
Call make an appointment with your primary care provider to get better control of your blood sugar and also for follow-up of your skin rash and itching.  The medication sent to your pharmacy is Atarax 25 mg.  This medication could cause drowsiness and increase your risk for falling.  This is to be taken every 6 hours if needed for itching.  The injection that you got in your arm should last in your body for several days and help decrease itching as well. Also watch your diet closely and avoid added sugar which will cause your diabetes to be out of control.

## 2020-06-09 NOTE — ED Notes (Signed)
See triage note resents with rash and itching to both arms  No resp issues  Positive itching

## 2020-06-09 NOTE — ED Provider Notes (Signed)
[facility] Emergency Department Provider Note  ____________________________________________   First MD Initiated Contact with Patient 06/09/20 [phone]     (approximate)  I have reviewed the triage vital signs and the nursing notes.   HISTORY  Chief Complaint Rash   HPI Brittany Sherman is a 74 y.o. female presents to the ED with complaint of itching to the palms of her hands when she woke up this morning.  Patient also reports that soon after both her arms were itching.  She denies any difficulty breathing, swallowing or speaking.  Patient denies any itching on the remainder of her body.  Patient was last seen in the ED for an allergic type reaction after eating seafood.       Past Medical History:  Diagnosis Date   Arthritis    Benign neoplasm of rectum and anal canal    Diabetes mellitus without complication (HCC)    History of vertigo    Hypertension    Immune deficiency disorder (HCC)    Infectious colitis, enteritis and gastroenteritis    Partial epilepsy with impairment of consciousness (HCC)    Seizures ([place])    Stroke (HCC)    Trigger finger of right thumb    Vitamin D deficiency     Patient Active Problem List   Diagnosis Date Noted   Yeast infection 12/24/2018   Aortic atherosclerosis ([place]) 08/12/2018   Adrenal nodule ([place]) 08/12/2018   Baker's cyst of knee, left 08/13/2017   Traumatic avulsion of nail plate of finger 24/26/8341   Osteoarthritis 02/27/2017   CKD (chronic kidney disease), stage III 02/27/2017   Hyperlipidemia associated with type 2 diabetes mellitus ([place]) 02/27/2017   Benign hypertensive renal disease    Uncontrolled type 2 diabetes with neuropathy (HCC)    History of stroke without residual deficits    Vitamin D deficiency    Trigger finger of right thumb    Benign neoplasm of colon    Partial epilepsy with impairment of consciousness ([place])     Past Surgical History:  Procedure  Laterality Date   ABDOMINAL HYSTERECTOMY     COLONOSCOPY WITH PROPOFOL N/A 07/24/2017   Procedure: COLONOSCOPY WITH PROPOFOL;  Surgeon: Jonathon Bellows, MD;  Location: [facility] ENDOSCOPY;  Service: Endoscopy;  Laterality: N/A;   OOPHORECTOMY     SPINE SURGERY     TOTAL HIP ARTHROPLASTY  2017    Prior to Admission medications   Medication Sig Start Date End Date Taking? Authorizing Provider  amLODipine (NORVASC) 5 MG tablet Take 1 tablet (5 mg total) by mouth daily. For further refills needs to see provider. 07/29/19   Cannady, Henrine Screws T, NP  atorvastatin (LIPITOR) 40 MG tablet TAKE 1 TABLET BY MOUTH ONCE DAILY IN THE MORNING 05/09/19   Johnson, Megan P, DO  brimonidine (ALPHAGAN) 0.2 % ophthalmic solution 1 drop 2 (two) times daily. 10/21/19   [provider]  divalproex (DEPAKOTE) 250 MG DR tablet Take 250 mg by mouth 2 (two) times daily. 09/16/15   [provider]  empagliflozin (JARDIANCE) 25 MG TABS tablet Take 25 mg by mouth daily. 01/22/19   [place] Liter P, DO  glimepiride (AMARYL) 4 MG tablet Take 1 tablet by mouth twice daily 04/23/19   Park Liter P, DO  hydrOXYzine (ATARAX/VISTARIL) 25 MG tablet Take 1 tablet (25 mg total) by mouth every 6 (six) hours as needed for itching. 06/09/20   Letitia Neri L, PA-C  insulin degludec (TRESIBA FLEXTOUCH) 100 UNIT/ML SOPN FlexTouch Pen Inject 0.3  mLs (30 Units total) into the skin daily at 10 pm. 07/18/18   [place] Liter P, DO  Insulin Pen Needle (PEN NEEDLES) 32G X 6 MM MISC 1 each by Does not apply route daily. 07/18/18   Johnson, Megan P, DO  lamoTRIgine (LAMICTAL) 100 MG tablet Take 100 mg by mouth 2 (two) times daily. 09/23/15   [provider]  lamoTRIgine (LAMICTAL) 150 MG tablet Take 150 mg by mouth daily. 11/18/19   [provider]  predniSONE (DELTASONE) 50 MG tablet Take 1 tablet (50 mg total) by mouth daily with breakfast. 05/08/20   Cuthriell, Charline Bills, PA-C  quinapril-hydrochlorothiazide (ACCURETIC) 20-25 MG  tablet TAKE 1 TABLET BY MOUTH ONCE DAILY 01/16/19   Johnson, Megan P, DO  Vitamin D, Ergocalciferol, (DRISDOL) 50000 units CAPS capsule Take 1 capsule (50,000 Units total) by mouth every 7 (seven) days. 07/19/18   Volney American, PA-C    Allergies Aspirin, Metformin and related, and Penicillins  Family History  Problem Relation Age of Onset   Brain cancer Mother    Esophageal cancer Sister    Aneurysm Sister    Cancer Sister    Breast cancer Neg Hx     Social History Social History   Tobacco Use   Smoking status: Former Smoker    Packs/day: 1.50    Years: 43.00    Pack years: 64.50    Types: Cigarettes    Quit date: 12/18/2006    Years since quitting: 13.4   Smokeless tobacco: Never Used  Scientific laboratory technician Use: Never used  Substance Use Topics   Alcohol use: No   Drug use: No    Review of Systems Constitutional: No fever/chills Eyes: No visual changes. ENT: No sore throat. Cardiovascular: Denies chest pain. Respiratory: Denies shortness of breath. Gastrointestinal: No abdominal pain.  No nausea, no vomiting.  Genitourinary: Negative for dysuria. Musculoskeletal: Negative for back pain. Skin: Positive for rash. Neurological: Negative for headaches, focal weakness or numbness. ____________________________________________   PHYSICAL EXAM:  VITAL SIGNS: ED Triage Vitals  Enc Vitals Group     BP 06/09/20 0729 126/82     Pulse Rate 06/09/20 0729 80     Resp 06/09/20 0729 18     Temp 06/09/20 0729 98.5 F (36.9 C)     Temp Source 06/09/20 0729 Oral     SpO2 06/09/20 0729 100 %     Weight 06/09/20 0730 170 lb (77.1 kg)     Height 06/09/20 0730 5\' 5"  (1.651 m)     Head Circumference --      Peak Flow --      Pain Score 06/09/20 0730 0     Pain Loc --      Pain Edu? --      Excl. in [place]? --     Constitutional: Alert and oriented. Well appearing and in no acute distress. Eyes: Conjunctivae are normal. PERRL. EOMI. Head: Atraumatic. Nose: No  congestion/rhinnorhea. Mouth/Throat: Mucous membranes are moist.  Oropharynx non-erythematous. Neck: No stridor.   Cardiovascular: Normal rate, regular rhythm. Grossly normal heart sounds.  Good peripheral circulation. Respiratory: Normal respiratory effort.  No retractions. Lungs CTAB. Gastrointestinal: Soft and nontender.  Musculoskeletal: Moves upper and lower extremities any difficulty and patient is able to ambulate without any assistance. Neurologic:  Normal speech and language. No gross focal neurologic deficits are appreciated. No gait instability. Skin:  Skin is warm, dry.  There is a mild erythematous rash bilateral forearms without vesicles or papules.  Patient is frequently scratching these areas including into her palms. Psychiatric: Mood and affect are normal. Speech and behavior are normal.  ____________________________________________   LABS (all labs ordered are listed, but only abnormal results are displayed)  Labs Reviewed  GLUCOSE, CAPILLARY - Abnormal; Notable for the following components:      Result Value   Glucose-Capillary 322 (*)    All other components within normal limits  CBG MONITORING, ED    PROCEDURES  Procedure(s) performed (including Critical Care):  Procedures   ____________________________________________   INITIAL IMPRESSION / ASSESSMENT AND PLAN / ED COURSE  As part of my medical decision making, I reviewed the following data within the electronic MEDICAL RECORD NUMBER Notes from prior ED visits and [place] Controlled Substance Database  74 year old female presents to the ED with complaint of rash into both her arms that started early this morning.  Patient states that she has not had any any breathing difficulty and this happened before.  She was seen previously after eating some seafood which she has not done since that ED visit.  Patient was given Benadryl and 10 mg of Decadron IM.  Patient was improving at the time of discharge.  Her diabetes has  been poorly controlled.  Patient does not adhere to a diabetic diet.  Currently she is taking oral medication only and that her PCP is not aware that she is not taking the insulin that was prescribed for her.  Glucose fingerstick while in the ED was 322.  In talking with patient her evening meal was very starchy and also had additional sugar added to her meal.  Patient is encouraged to follow-up with her PCP to get better control of her diabetes.  She was discharged with a prescription for Atarax 25 mg every 6 hours as needed for itching.  Patient is aware that this medication could cause drowsiness and increase her risk for injury.   ____________________________________________   FINAL CLINICAL IMPRESSION(S) / ED DIAGNOSES  Final diagnoses:  Rash and nonspecific skin eruption  Poorly controlled diabetes mellitus [facility])     ED Discharge Orders         Ordered    hydrOXYzine (ATARAX/VISTARIL) 25 MG tablet  Every 6 hours PRN     Discontinue  Reprint     06/09/20 0849           Note:  This document was prepared using Dragon voice recognition software and may include unintentional dictation errors.    Johnn Hai, PA-C 06/09/20 1102    Vanessa [place], MD 06/10/20 [phone]

## 2020-06-09 NOTE — ED Triage Notes (Signed)
Pt woke up reporting having itching in palms.  Thinks she is having another allergic reaction, seen here recently for same. C/o rash; at this time RN does not visualize rash to visible parts of pt body.  No dyspnea noted. VSS

## 2020-06-18 ENCOUNTER — Other Ambulatory Visit: Payer: Self-pay | Admitting: Family Medicine

## 2020-06-18 DIAGNOSIS — Z1231 Encounter for screening mammogram for malignant neoplasm of breast: Secondary | ICD-10-CM

## 2020-06-28 ENCOUNTER — Telehealth: Payer: Self-pay

## 2020-06-28 NOTE — Telephone Encounter (Signed)
Patient called and left a voicemail for Sharyn Lull not sure what she was calling about. Called patient and left a message for call back

## 2020-06-29 ENCOUNTER — Other Ambulatory Visit: Payer: Self-pay

## 2020-06-29 ENCOUNTER — Telehealth (INDEPENDENT_AMBULATORY_CARE_PROVIDER_SITE_OTHER): Payer: Self-pay | Admitting: Gastroenterology

## 2020-06-29 DIAGNOSIS — Z86010 Personal history of colonic polyps: Secondary | ICD-10-CM

## 2020-06-29 MED ORDER — NA SULFATE-K SULFATE-MG SULF 17.5-3.13-1.6 GM/177ML PO SOLN: 1.0000 | Freq: Once | ORAL | 0 refills | Status: AC

## 2020-06-29 NOTE — Progress Notes (Signed)
Gastroenterology Pre-Procedure Review  Request Date: Tuesday 08/03/20 Requesting Physician: Dr. Vicente Males  PATIENT REVIEW QUESTIONS: The patient responded to the following health history questions as indicated:    1. Are you having any GI issues? no 2. Do you have a personal history of Polyps? yes (07/24/17 Dr. Vicente Males noted colon polyps) 3. Do you have a family history of Colon Cancer or Polyps? no 4. Diabetes Mellitus? yes (type 2 patient takes oral meds and insulin) 5. Joint replacements in the past 12 months?no 6. Major health problems in the past 3 months?yes (Food reaction 05/22, skin rash 06/23) 7. Any artificial heart valves, MVP, or defibrillator?no    MEDICATIONS & ALLERGIES:    Patient reports the following regarding taking any anticoagulation/antiplatelet therapy:   Plavix, Coumadin, Eliquis, Xarelto, Lovenox, Pradaxa, Brilinta, or Effient? no Aspirin? no  Patient confirms/reports the following medications:  Current Outpatient Medications  Medication Sig Dispense Refill  . amLODipine (NORVASC) 5 MG tablet Take 1 tablet (5 mg total) by mouth daily. For further refills needs to see provider. 30 tablet 0  . atorvastatin (LIPITOR) 40 MG tablet TAKE 1 TABLET BY MOUTH ONCE DAILY IN THE MORNING 90 tablet 0  . brimonidine (ALPHAGAN) 0.2 % ophthalmic solution 1 drop 2 (two) times daily.    . divalproex (DEPAKOTE) 250 MG DR tablet Take 250 mg by mouth 2 (two) times daily.    . empagliflozin (JARDIANCE) 25 MG TABS tablet Take 25 mg by mouth daily. 90 tablet 1  . glimepiride (AMARYL) 4 MG tablet Take 1 tablet by mouth twice daily 180 tablet 0  . insulin degludec (TRESIBA FLEXTOUCH) 100 UNIT/ML SOPN FlexTouch Pen Inject 0.3 mLs (30 Units total) into the skin daily at 10 pm. 3 mL 3  . Insulin Pen Needle (PEN NEEDLES) 32G X 6 MM MISC 1 each by Does not apply route daily. 100 each 12  . lamoTRIgine (LAMICTAL) 100 MG tablet Take 100 mg by mouth 2 (two) times daily.    .  quinapril-hydrochlorothiazide (ACCURETIC) 20-25 MG tablet TAKE 1 TABLET BY MOUTH ONCE DAILY 90 tablet 0  . Vitamin D, Ergocalciferol, (DRISDOL) 50000 units CAPS capsule Take 1 capsule (50,000 Units total) by mouth every 7 (seven) days. 12 capsule 3  . hydrOXYzine (ATARAX/VISTARIL) 25 MG tablet Take 1 tablet (25 mg total) by mouth every 6 (six) hours as needed for itching. (Patient not taking: Reported on 06/29/2020) 30 tablet 0  . lamoTRIgine (LAMICTAL) 150 MG tablet Take 150 mg by mouth daily. (Patient not taking: Reported on 06/29/2020)    . Na Sulfate-K Sulfate-Mg Sulf 17.5-3.13-1.6 GM/177ML SOLN Take 1 kit by mouth once for 1 dose. 354 mL 0  . predniSONE (DELTASONE) 50 MG tablet Take 1 tablet (50 mg total) by mouth daily with breakfast. (Patient not taking: Reported on 06/29/2020) 5 tablet 0   No current facility-administered medications for this visit.    Patient confirms/reports the following allergies:  Allergies  Allergen Reactions  . Aspirin     Stroke  . Metformin And Related Diarrhea  . Penicillins Rash    Has patient had a PCN reaction causing immediate rash, facial/tongue/throat swelling, SOB or lightheadedness with hypotension: Yes Has patient had a PCN reaction causing severe rash involving mucus membranes or skin necrosis: No Has patient had a PCN reaction that required hospitalization No Has patient had a PCN reaction occurring within the last 10 years: Yes If all of the above answers are "NO", then may proceed with Cephalosporin use.  No orders of the defined types were placed in this encounter.   AUTHORIZATION INFORMATION Primary Insurance: 1D#: Group #:  Secondary Insurance: 1D#: Group #:  SCHEDULE INFORMATION: Date: Tuesday 08/03/20 Time: Location:[facility]

## 2020-07-13 ENCOUNTER — Ambulatory Visit
Admission: RE | Admit: 2020-07-13 | Discharge: 2020-07-13 | Disposition: A | Discharge status: Home / Self Care | Payer: Medicare Other | Source: Ambulatory Visit | Attending: Family Medicine | Admitting: Family Medicine

## 2020-07-13 DIAGNOSIS — Z1231 Encounter for screening mammogram for malignant neoplasm of breast: Secondary | ICD-10-CM

## 2020-07-30 ENCOUNTER — Other Ambulatory Visit: Payer: Self-pay

## 2020-07-30 ENCOUNTER — Other Ambulatory Visit
Admission: RE | Admit: 2020-07-30 | Discharge: 2020-07-30 | Disposition: A | Discharge status: Home / Self Care | Payer: Medicare Other | Source: Ambulatory Visit | Attending: Gastroenterology | Admitting: Gastroenterology

## 2020-07-30 DIAGNOSIS — Z20822 Contact with and (suspected) exposure to covid-19: Secondary | ICD-10-CM | POA: Diagnosis not present

## 2020-07-30 DIAGNOSIS — Z01812 Encounter for preprocedural laboratory examination: Secondary | ICD-10-CM | POA: Diagnosis present

## 2020-07-30 LAB — SARS CORONAVIRUS 2 (TAT 6-24 HRS): SARS Coronavirus 2: NEGATIVE

## 2020-08-02 ENCOUNTER — Encounter: Payer: Self-pay | Admitting: Gastroenterology

## 2020-08-03 ENCOUNTER — Other Ambulatory Visit: Payer: Self-pay

## 2020-08-03 ENCOUNTER — Encounter: Payer: Self-pay | Admitting: Gastroenterology

## 2020-08-03 ENCOUNTER — Encounter
Admission: RE | Disposition: A | Discharge status: Home / Self Care | Payer: Self-pay | Source: Ambulatory Visit | Attending: Gastroenterology

## 2020-08-03 ENCOUNTER — Ambulatory Visit: Payer: Medicare Other | Admitting: Anesthesiology

## 2020-08-03 ENCOUNTER — Ambulatory Visit
Admission: RE | Admit: 2020-08-03 | Discharge: 2020-08-03 | Disposition: A | Discharge status: Home / Self Care | Payer: Medicare Other | Source: Ambulatory Visit | Attending: Gastroenterology | Admitting: Gastroenterology

## 2020-08-03 DIAGNOSIS — Z86010 Personal history of colonic polyps: Secondary | ICD-10-CM | POA: Insufficient documentation

## 2020-08-03 DIAGNOSIS — Z8673 Personal history of transient ischemic attack (TIA), and cerebral infarction without residual deficits: Secondary | ICD-10-CM | POA: Diagnosis not present

## 2020-08-03 DIAGNOSIS — D120 Benign neoplasm of cecum: Secondary | ICD-10-CM | POA: Diagnosis not present

## 2020-08-03 DIAGNOSIS — Z09 Encounter for follow-up examination after completed treatment for conditions other than malignant neoplasm: Secondary | ICD-10-CM | POA: Diagnosis present

## 2020-08-03 DIAGNOSIS — I10 Essential (primary) hypertension: Secondary | ICD-10-CM | POA: Insufficient documentation

## 2020-08-03 DIAGNOSIS — Z794 Long term (current) use of insulin: Secondary | ICD-10-CM | POA: Insufficient documentation

## 2020-08-03 DIAGNOSIS — K635 Polyp of colon: Secondary | ICD-10-CM | POA: Diagnosis not present

## 2020-08-03 DIAGNOSIS — Z79899 Other long term (current) drug therapy: Secondary | ICD-10-CM | POA: Insufficient documentation

## 2020-08-03 DIAGNOSIS — D124 Benign neoplasm of descending colon: Secondary | ICD-10-CM | POA: Insufficient documentation

## 2020-08-03 DIAGNOSIS — D849 Immunodeficiency, unspecified: Secondary | ICD-10-CM | POA: Diagnosis not present

## 2020-08-03 DIAGNOSIS — D122 Benign neoplasm of ascending colon: Secondary | ICD-10-CM | POA: Insufficient documentation

## 2020-08-03 DIAGNOSIS — Z87891 Personal history of nicotine dependence: Secondary | ICD-10-CM | POA: Diagnosis not present

## 2020-08-03 DIAGNOSIS — E559 Vitamin D deficiency, unspecified: Secondary | ICD-10-CM | POA: Diagnosis not present

## 2020-08-03 DIAGNOSIS — E119 Type 2 diabetes mellitus without complications: Secondary | ICD-10-CM | POA: Insufficient documentation

## 2020-08-03 DIAGNOSIS — G40802 Other epilepsy, not intractable, without status epilepticus: Secondary | ICD-10-CM | POA: Insufficient documentation

## 2020-08-03 HISTORY — PX: COLONOSCOPY WITH PROPOFOL: SHX5780

## 2020-08-03 LAB — GLUCOSE, CAPILLARY: Glucose-Capillary: 315 mg/dL — ABNORMAL HIGH (ref 70–99)

## 2020-08-03 SURGERY — COLONOSCOPY WITH PROPOFOL
Anesthesia: General

## 2020-08-03 MED ORDER — PROPOFOL 500 MG/50ML IV EMUL
INTRAVENOUS | Status: AC
  Filled 2020-08-03: qty 50

## 2020-08-03 MED ORDER — LIDOCAINE HCL (CARDIAC) PF 100 MG/5ML IV SOSY
PREFILLED_SYRINGE | INTRAVENOUS | Status: DC | PRN
  Administered 2020-08-03: 50 mg via INTRAVENOUS

## 2020-08-03 MED ORDER — PROPOFOL 10 MG/ML IV BOLUS
INTRAVENOUS | Status: DC | PRN
  Administered 2020-08-03: 50 mg via INTRAVENOUS

## 2020-08-03 MED ORDER — SODIUM CHLORIDE 0.9 % IV SOLN
INTRAVENOUS | Status: DC
  Administered 2020-08-03: 1000 mL via INTRAVENOUS

## 2020-08-03 MED ORDER — PROPOFOL 500 MG/50ML IV EMUL
INTRAVENOUS | Status: DC | PRN
  Administered 2020-08-03: 125 ug/kg/min via INTRAVENOUS

## 2020-08-03 NOTE — Transfer of Care (Signed)
Immediate Anesthesia Transfer of Care Note  Patient: Brittany Sherman  Procedure(s) Performed: COLONOSCOPY WITH PROPOFOL (N/A )  Patient Location: PACU and Endoscopy Unit  Anesthesia Type:General  Level of Consciousness: drowsy  Airway & Oxygen Therapy: Patient Spontanous Breathing and Patient connected to nasal cannula oxygen  Post-op Assessment: Report given to RN and Post -op Vital signs reviewed and stable  Post vital signs: Reviewed and stable  Last Vitals:  Vitals Value Taken Time  BP 99/59 08/03/20 1013  Temp 36.2 C 08/03/20 1013  Pulse 67 08/03/20 1014  Resp 12 08/03/20 1014  SpO2 97 % 08/03/20 1014  Vitals shown include unvalidated device data.  Last Pain:  Vitals:   08/03/20 0912  TempSrc: Axillary  PainSc: 0-No pain         Complications: No complications documented.

## 2020-08-03 NOTE — Anesthesia Preprocedure Evaluation (Signed)
Anesthesia Evaluation  Patient identified by MRN, date of birth, ID band Patient awake    Reviewed: Allergy & Precautions, NPO status , Patient's Chart, lab work & pertinent test results  History of Anesthesia Complications Negative for: history of anesthetic complications  Airway Mallampati: II       Dental  (+) Upper Dentures, Lower Dentures   Pulmonary neg sleep apnea, neg COPD, Not current smoker, former smoker,           Cardiovascular hypertension, Pt. on medications (-) Past MI and (-) CHF (-) dysrhythmias (-) Valvular Problems/Murmurs     Neuro/Psych Seizures - (none in last 15 yrs), Well Controlled,  CVA (found on scan), No Residual Symptoms    GI/Hepatic Neg liver ROS, neg GERD  ,  Endo/Other  diabetes, Type 2, Insulin Dependent, Oral Hypoglycemic Agents  Renal/GU Renal InsufficiencyRenal disease     Musculoskeletal   Abdominal   Peds  Hematology   Anesthesia Other Findings   Reproductive/Obstetrics                             Anesthesia Physical Anesthesia Plan  ASA: III  Anesthesia Plan: General   Post-op Pain Management:    Induction: Intravenous  PONV Risk Score and Plan: 3 and Propofol infusion, TIVA and Treatment may vary due to age or medical condition  Airway Management Planned: Nasal Cannula  Additional Equipment:   Intra-op Plan:   Post-operative Plan:   Informed Consent: I have reviewed the patients History and Physical, chart, labs and discussed the procedure including the risks, benefits and alternatives for the proposed anesthesia with the patient or authorized representative who has indicated his/her understanding and acceptance.       Plan Discussed with:   Anesthesia Plan Comments:         Anesthesia Quick Evaluation

## 2020-08-03 NOTE — Anesthesia Postprocedure Evaluation (Signed)
Anesthesia Post Note  Patient: Brittany Sherman  Procedure(s) Performed: COLONOSCOPY WITH PROPOFOL (N/A )  Patient location during evaluation: Endoscopy Anesthesia Type: General Level of consciousness: awake and alert Pain management: pain level controlled Vital Signs Assessment: post-procedure vital signs reviewed and stable Respiratory status: spontaneous breathing and respiratory function stable Cardiovascular status: stable Anesthetic complications: no   No complications documented.   Last Vitals:  Vitals:   08/03/20 1013 08/03/20 1023  BP: (!) 99/59 118/70  Pulse: 69 65  Resp: 11 13  Temp: (!) 36.2 C   SpO2: 93% 100%    Last Pain:  Vitals:   08/03/20 1023  TempSrc:   PainSc: 0-No pain                 [name]

## 2020-08-03 NOTE — Op Note (Signed)
[facility] Gastroenterology Patient Name: Brittany Sherman Procedure Date: 08/03/2020 9:06 AM MRN: [id number] Account #: [id number] Date of Birth: Oct 25, 1946 Admit Type: Outpatient Age: 74 Room: [facility] ENDO ROOM 1 Gender: Female Note Status: Finalized Procedure:             Colonoscopy Indications:           High risk colon cancer surveillance: Personal history                         of colonic polyps, Last colonoscopy: August 2018 Providers:             Jonathon Bellows MD, MD Medicines:             Monitored Anesthesia Care Complications:         No immediate complications. Procedure:             Pre-Anesthesia Assessment:                        - Prior to the procedure, a History and Physical was                         performed, and patient medications, allergies and                         sensitivities were reviewed. The patient's tolerance                         of previous anesthesia was reviewed.                        - The risks and benefits of the procedure and the                         sedation options and risks were discussed with the                         patient. All questions were answered and informed                         consent was obtained.                        - ASA Grade Assessment: II - A patient with mild                         systemic disease.                        After obtaining informed consent, the colonoscope was                         passed under direct vision. Throughout the procedure,                         the patient's blood pressure, pulse, and oxygen                         saturations were monitored continuously. The  Colonoscope was introduced through the anus and                         advanced to the the cecum, identified by the                         appendiceal orifice. The colonoscopy was performed                         with ease. The patient tolerated the procedure well.                          The quality of the bowel preparation was excellent. Findings:      The perianal and digital rectal examinations were normal.      A 12 mm polyp was found in the descending colon. The polyp was sessile.       The polyp was removed with a cold snare. Resection and retrieval were       complete. To prevent bleeding after the polypectomy, two hemostatic       clips were successfully placed. There was no bleeding during the       procedure.      Three sessile polyps were found in the descending colon, ascending colon       and cecum. The polyps were 4 to 6 mm in size. These polyps were removed       with a cold snare. Resection and retrieval were complete.      Two sessile polyps were found in the cecum. The polyps were 6 to 14 mm       in size. These polyps were removed with a cold snare. Resection and       retrieval were complete.      The exam was otherwise without abnormality. Impression:            - One 12 mm polyp in the descending colon, removed                         with a cold snare. Resected and retrieved. Clips were                         placed.                        - Three 4 to 6 mm polyps in the descending colon, in                         the ascending colon and in the cecum, removed with a                         cold snare. Resected and retrieved.                        - Two 6 to 14 mm polyps in the cecum, removed with a                         cold snare. Resected and retrieved.                        -  The examination was otherwise normal. Recommendation:        - Discharge patient to home (with escort).                        - Resume previous diet.                        - Continue present medications.                        - Await pathology results.                        - Repeat colonoscopy for surveillance based on                         pathology results. Procedure Code(s):     --- Professional ---                        [phone], Colonoscopy,  flexible; with removal of                         tumor(s), polyp(s), or other lesion(s) by snare                         technique Diagnosis Code(s):     --- Professional ---                        K63.5, Polyp of colon                        Z86.010, Personal history of colonic polyps CPT copyright 2019 American Medical Association. All rights reserved. The codes documented in this report are preliminary and upon coder review may  be revised to meet current compliance requirements. Jonathon Bellows, MD Jonathon Bellows MD, MD 08/03/2020 10:12:50 AM This report has been signed electronically. Number of Addenda: 0 Note Initiated On: 08/03/2020 9:06 AM Scope Withdrawal Time: 0 hours 22 minutes 8 seconds  Total Procedure Duration: 0 hours 25 minutes 4 seconds  Estimated Blood Loss:  Estimated blood loss: none.      [facility]

## 2020-08-03 NOTE — H&P (Signed)
Brittany Bellows, MD [address] [place], [place], [place], [address] [address], [place], [place], [place], [address] Phone: [phone]  Fax: [phone]  Primary Care Physician:  [facility] [place]   Pre-Procedure History & Physical: HPI:  Brittany Sherman is a 74 y.o. female is here for an colonoscopy.   Past Medical History:  Diagnosis Date  . Arthritis   . Benign neoplasm of rectum and anal canal   . Diabetes mellitus without complication ([place])   . History of vertigo   . Hypertension   . Immune deficiency disorder ([place])   . Infectious colitis, enteritis and gastroenteritis   . Partial epilepsy with impairment of consciousness ([place])   . Seizures ([place])   . Stroke ([place])   . Trigger finger of right thumb   . Vitamin D deficiency     Past Surgical History:  Procedure Laterality Date  . ABDOMINAL HYSTERECTOMY    . COLONOSCOPY WITH PROPOFOL N/A 07/24/2017   Procedure: COLONOSCOPY WITH PROPOFOL;  Surgeon: Brittany Bellows, MD;  Location: [facility] ENDOSCOPY;  Service: Endoscopy;  Laterality: N/A;  . OOPHORECTOMY    . SPINE SURGERY    . TOTAL HIP ARTHROPLASTY  2017    Prior to Admission medications   Medication Sig Start Date End Date Taking? Authorizing Provider  amLODipine (NORVASC) 5 MG tablet Take 1 tablet (5 mg total) by mouth daily. For further refills needs to see provider. 07/29/19   Cannady, Henrine Screws T, NP  atorvastatin (LIPITOR) 40 MG tablet TAKE 1 TABLET BY MOUTH ONCE DAILY IN THE MORNING 05/09/19   Johnson, Megan P, DO  brimonidine (ALPHAGAN) 0.2 % ophthalmic solution 1 drop 2 (two) times daily. 10/21/19   [provider]  divalproex (DEPAKOTE) 250 MG DR tablet Take 250 mg by mouth 2 (two) times daily. 09/16/15   [provider]  empagliflozin (JARDIANCE) 25 MG TABS tablet Take 25 mg by mouth daily. 01/22/19   Park Liter P, DO  glimepiride (AMARYL) 4 MG tablet Take 1 tablet by mouth twice daily 04/23/19   Wynetta Emery, Megan P, DO  hydrOXYzine  (ATARAX/VISTARIL) 25 MG tablet Take 1 tablet (25 mg total) by mouth every 6 (six) hours as needed for itching. Patient not taking: Reported on 06/29/2020 06/09/20   Letitia Neri L, PA-C  insulin degludec (TRESIBA FLEXTOUCH) 100 UNIT/ML SOPN FlexTouch Pen Inject 0.3 mLs (30 Units total) into the skin daily at 10 pm. 07/18/18   Park Liter P, DO  Insulin Pen Needle (PEN NEEDLES) 32G X 6 MM MISC 1 each by Does not apply route daily. 07/18/18   Johnson, Megan P, DO  lamoTRIgine (LAMICTAL) 100 MG tablet Take 100 mg by mouth 2 (two) times daily. 09/23/15   [provider]  lamoTRIgine (LAMICTAL) 150 MG tablet Take 150 mg by mouth daily. Patient not taking: Reported on 06/29/2020 11/18/19   [provider]  predniSONE (DELTASONE) 50 MG tablet Take 1 tablet (50 mg total) by mouth daily with breakfast. Patient not taking: Reported on 06/29/2020 05/08/20   Cuthriell, Charline Bills, PA-C  quinapril-hydrochlorothiazide (ACCURETIC) 20-25 MG tablet TAKE 1 TABLET BY MOUTH ONCE DAILY 01/16/19   Johnson, Megan P, DO  Vitamin D, Ergocalciferol, (DRISDOL) 50000 units CAPS capsule Take 1 capsule (50,000 Units total) by mouth every 7 (seven) days. 07/19/18   Volney American, PA-C    Allergies as of 06/29/2020 - Review Complete 06/29/2020  Allergen Reaction Noted  . Aspirin  12/11/2015  . Metformin and related Diarrhea 04/02/2017  .  Penicillins Rash 12/11/2015    Family History  Problem Relation Age of Onset  . Brain cancer Mother   . Esophageal cancer Sister   . Aneurysm Sister   . Cancer Sister   . Breast cancer Neg Hx     Social History   Socioeconomic History  . Marital status: Married    Spouse name: Not on file  . Number of children: Not on file  . Years of education: Not on file  . Highest education level: 8th grade  Occupational History  . Not on file  Tobacco Use  . Smoking status: Former Smoker    Packs/day: 1.50    Years: 43.00    Pack years: 64.50    Types: Cigarettes     Quit date: 12/18/2006    Years since quitting: 13.6  . Smokeless tobacco: Never Used  Vaping Use  . Vaping Use: Never used  Substance and Sexual Activity  . Alcohol use: No  . Drug use: No  . Sexual activity: Never  Other Topics Concern  . Not on file  Social History Narrative  . Not on file   Social Determinants of Health   Financial Resource Strain:   . Difficulty of Paying Living Expenses:   Food Insecurity:   . Worried About Charity fundraiser in the Last Year:   . Arboriculturist in the Last Year:   Transportation Needs:   . Film/video editor (Medical):   Marland Kitchen Lack of Transportation (Non-Medical):   Physical Activity:   . Days of Exercise per Week:   . Minutes of Exercise per Session:   Stress:   . Feeling of Stress :   Social Connections:   . Frequency of Communication with Friends and Family:   . Frequency of Social Gatherings with Friends and Family:   . Attends Religious Services:   . Active Member of Clubs or Organizations:   . Attends Archivist Meetings:   Marland Kitchen Marital Status:   Intimate Partner Violence:   . Fear of Current or Ex-Partner:   . Emotionally Abused:   Marland Kitchen Physically Abused:   . Sexually Abused:     Review of Systems: See HPI, otherwise negative ROS  Physical Exam: There were no vitals taken for this visit. General:   Alert,  pleasant and cooperative in NAD Head:  Normocephalic and atraumatic. Neck:  Supple; no masses or thyromegaly. Lungs:  Clear throughout to auscultation, normal respiratory effort.    Heart:  +S1, +S2, Regular rate and rhythm, No edema. Abdomen:  Soft, nontender and nondistended. Normal bowel sounds, without guarding, and without rebound.   Neurologic:  Alert and  oriented x4;  grossly normal neurologically.  Impression/Plan: Brittany Sherman is here for an colonoscopy to be performed for surveillance due to prior history of colon polyps   Risks, benefits, limitations, and alternatives regarding   colonoscopy have been reviewed with the patient.  Questions have been answered.  All parties agreeable.   Brittany Bellows, MD  08/03/2020, 9:04 AM

## 2020-08-04 ENCOUNTER — Encounter: Payer: Self-pay | Admitting: Gastroenterology

## 2020-08-04 LAB — SURGICAL PATHOLOGY

## 2020-08-05 ENCOUNTER — Encounter: Payer: Self-pay | Admitting: Gastroenterology

## 2020-11-02 ENCOUNTER — Telehealth: Payer: Self-pay | Admitting: *Deleted

## 2020-11-02 NOTE — Telephone Encounter (Signed)
Attempted to contact and schedule lung screening scan. Message left for patient to call back to schedule. 

## 2020-11-16 ENCOUNTER — Telehealth: Payer: Self-pay | Admitting: *Deleted

## 2020-11-16 NOTE — Telephone Encounter (Signed)
Attempted to contact and schedule lung screening scan. Message left for patient to call back to schedule. 

## 2020-12-08 ENCOUNTER — Telehealth: Payer: Self-pay | Admitting: *Deleted

## 2020-12-08 NOTE — Telephone Encounter (Signed)
Attempted to contact and schedule lung screening scan. Message left for patient to call back to schedule. 

## 2020-12-22 ENCOUNTER — Encounter: Payer: Self-pay | Admitting: *Deleted

## 2021-02-17 ENCOUNTER — Telehealth: Payer: Self-pay | Admitting: *Deleted

## 2021-02-17 NOTE — Telephone Encounter (Signed)
Patient scheduled for screening CT 03/24/2021 at 10:15 no change in insurance, quit smoking 15 years ago.

## 2021-02-18 ENCOUNTER — Other Ambulatory Visit: Payer: Self-pay | Admitting: *Deleted

## 2021-02-18 DIAGNOSIS — Z122 Encounter for screening for malignant neoplasm of respiratory organs: Secondary | ICD-10-CM

## 2021-02-18 DIAGNOSIS — Z87891 Personal history of nicotine dependence: Secondary | ICD-10-CM

## 2021-02-18 NOTE — Progress Notes (Signed)
Contacted and scheduled for annual lung screening scan. Patient is a former smoker, quit 2008, 64.5 pack year history.

## 2021-03-23 ENCOUNTER — Other Ambulatory Visit: Payer: Self-pay | Admitting: Family Medicine

## 2021-03-23 DIAGNOSIS — Z1231 Encounter for screening mammogram for malignant neoplasm of breast: Secondary | ICD-10-CM

## 2021-03-24 ENCOUNTER — Other Ambulatory Visit: Payer: Self-pay

## 2021-03-24 ENCOUNTER — Ambulatory Visit
Admission: RE | Admit: 2021-03-24 | Discharge: 2021-03-24 | Disposition: A | Discharge status: Home / Self Care | Payer: Medicare Other | Source: Ambulatory Visit | Attending: Nurse Practitioner | Admitting: Nurse Practitioner

## 2021-03-24 DIAGNOSIS — Z122 Encounter for screening for malignant neoplasm of respiratory organs: Secondary | ICD-10-CM | POA: Insufficient documentation

## 2021-03-24 DIAGNOSIS — Z87891 Personal history of nicotine dependence: Secondary | ICD-10-CM | POA: Insufficient documentation

## 2021-03-30 ENCOUNTER — Encounter: Payer: Self-pay | Admitting: *Deleted

## 2021-06-07 ENCOUNTER — Ambulatory Visit: Payer: Medicare Other | Admitting: Cardiovascular Disease

## 2021-06-07 NOTE — Progress Notes (Deleted)
Cardiology Office Note  Date:  06/07/2021   ID:  Rokhaya, Quinn 10/26/1946, MRN [id number]  PCP:  [facility]   No chief complaint on file.   HPI:   Ms. douglas rooks is a 75 year old woman with past medical history of Diabetes type 2 with neuropathy Presenting by referral for consultation of her coronary calcium   Chest CT scan April 2022 aortic atherosclerosis, as well as atherosclerosis of the great vessels of the mediastinum and the coronary arteries, including calcified atherosclerotic plaque in the left circumflex coronary artery.     PMH:   has a past medical history of Arthritis, Benign neoplasm of rectum and anal canal, Diabetes mellitus without complication ([place]), History of vertigo, Hypertension, Immune deficiency disorder ([place]), Infectious colitis, enteritis and gastroenteritis, Partial epilepsy with impairment of consciousness ([place]), Seizures ([place]), Stroke [facility]), Trigger finger of right thumb, and Vitamin D deficiency.  PSH:    Past Surgical History:  Procedure Laterality Date   ABDOMINAL HYSTERECTOMY     BACK SURGERY     COLONOSCOPY WITH PROPOFOL N/A 07/24/2017   Procedure: COLONOSCOPY WITH PROPOFOL;  Surgeon: Jonathon Bellows, MD;  Location: [facility] ENDOSCOPY;  Service: Endoscopy;  Laterality: N/A;   COLONOSCOPY WITH PROPOFOL N/A 08/03/2020   Procedure: COLONOSCOPY WITH PROPOFOL;  Surgeon: Jonathon Bellows, MD;  Location: [facility] ENDOSCOPY;  Service: Gastroenterology;  Laterality: N/A;   OOPHORECTOMY     SPINE SURGERY     TOTAL HIP ARTHROPLASTY  2017    Current Outpatient Medications  Medication Sig Dispense Refill   amLODipine (NORVASC) 5 MG tablet Take 1 tablet (5 mg total) by mouth daily. For further refills needs to see provider. 30 tablet 0   atorvastatin (LIPITOR) 40 MG tablet TAKE 1 TABLET BY MOUTH ONCE DAILY IN THE MORNING 90 tablet 0   brimonidine (ALPHAGAN) 0.2 % ophthalmic solution 1 drop 2 (two) times daily.     divalproex (DEPAKOTE) 250  MG DR tablet Take 250 mg by mouth 2 (two) times daily.     empagliflozin (JARDIANCE) 25 MG TABS tablet Take 25 mg by mouth daily. 90 tablet 1   glimepiride (AMARYL) 4 MG tablet Take 1 tablet by mouth twice daily 180 tablet 0   hydrOXYzine (ATARAX/VISTARIL) 25 MG tablet Take 1 tablet (25 mg total) by mouth every 6 (six) hours as needed for itching. (Patient not taking: Reported on 06/29/2020) 30 tablet 0   insulin degludec (TRESIBA FLEXTOUCH) 100 UNIT/ML SOPN FlexTouch Pen Inject 0.3 mLs (30 Units total) into the skin daily at 10 pm. 3 mL 3   Insulin Pen Needle (PEN NEEDLES) 32G X 6 MM MISC 1 each by Does not apply route daily. 100 each 12   lamoTRIgine (LAMICTAL) 100 MG tablet Take 100 mg by mouth 2 (two) times daily.     lamoTRIgine (LAMICTAL) 150 MG tablet Take 150 mg by mouth daily. (Patient not taking: Reported on 06/29/2020)     predniSONE (DELTASONE) 50 MG tablet Take 1 tablet (50 mg total) by mouth daily with breakfast. (Patient not taking: Reported on 06/29/2020) 5 tablet 0   quinapril-hydrochlorothiazide (ACCURETIC) 20-25 MG tablet TAKE 1 TABLET BY MOUTH ONCE DAILY 90 tablet 0   Vitamin D, Ergocalciferol, (DRISDOL) 50000 units CAPS capsule Take 1 capsule (50,000 Units total) by mouth every 7 (seven) days. 12 capsule 3   No current facility-administered medications for this visit.     Allergies:   Aspirin, Metformin and related, and Penicillins   Social History:  The patient  reports that she quit smoking about 14 years ago. Her smoking use included cigarettes. She has a 64.50 pack-year smoking history. She has never used smokeless tobacco. She reports that she does not drink alcohol and does not use drugs.   Family History:   family history includes Aneurysm in her sister; Brain cancer in her mother; Cancer in her sister; Esophageal cancer in her sister.    Review of Systems: ROS   PHYSICAL EXAM: VS:  There were no vitals taken for this visit. , BMI There is no height or weight on  file to calculate BMI. GEN: Well nourished, well developed, in no acute distress HEENT: normal Neck: no JVD, carotid bruits, or masses Cardiac: RRR; no murmurs, rubs, or gallops,no edema  Respiratory:  clear to auscultation bilaterally, normal work of breathing GI: soft, nontender, nondistended, + BS MS: no deformity or atrophy Skin: warm and dry, no rash Neuro:  Strength and sensation are intact Psych: euthymic mood, full affect    Recent Labs: No results found for requested labs within last 8760 hours.    Lipid Panel Lab Results  Component Value Date   CHOL 135 07/18/2018   HDL 44 07/18/2018   LDLCALC 70 07/18/2018   TRIG 106 07/18/2018      Wt Readings from Last 3 Encounters:  03/24/21 170 lb (77.1 kg)  08/03/20 170 lb 5.6 oz (77.3 kg)  06/09/20 170 lb (77.1 kg)       ASSESSMENT AND PLAN:  Problem List Items Addressed This Visit   None    Disposition:   F/U  12 months   Total encounter time more than 25 minutes  Greater than 50% was spent in counseling and coordination of care with the patient    Signed, Esmond Plants, M.D., Ph.D. [place], [place]

## 2021-06-09 ENCOUNTER — Other Ambulatory Visit (HOSPITAL_COMMUNITY): Payer: Self-pay | Admitting: Ophthalmology

## 2021-06-09 ENCOUNTER — Other Ambulatory Visit: Payer: Self-pay | Admitting: Ophthalmology

## 2021-06-09 DIAGNOSIS — H34211 Partial retinal artery occlusion, right eye: Secondary | ICD-10-CM

## 2021-07-01 ENCOUNTER — Other Ambulatory Visit: Payer: Self-pay

## 2021-07-01 ENCOUNTER — Ambulatory Visit
Admission: RE | Admit: 2021-07-01 | Discharge: 2021-07-01 | Disposition: A | Discharge status: Home / Self Care | Payer: Medicare Other | Source: Ambulatory Visit | Attending: Ophthalmology | Admitting: Ophthalmology

## 2021-07-01 DIAGNOSIS — H34211 Partial retinal artery occlusion, right eye: Secondary | ICD-10-CM | POA: Diagnosis not present

## 2021-07-14 ENCOUNTER — Other Ambulatory Visit: Payer: Self-pay

## 2021-07-14 ENCOUNTER — Ambulatory Visit
Admission: RE | Admit: 2021-07-14 | Discharge: 2021-07-14 | Disposition: A | Discharge status: Home / Self Care | Payer: Medicare Other | Source: Ambulatory Visit | Attending: Family Medicine | Admitting: Family Medicine

## 2021-07-14 DIAGNOSIS — Z1231 Encounter for screening mammogram for malignant neoplasm of breast: Secondary | ICD-10-CM | POA: Diagnosis present

## 2021-07-15 ENCOUNTER — Ambulatory Visit: Payer: Medicare Other | Admitting: Cardiovascular Disease

## 2021-07-15 ENCOUNTER — Encounter: Payer: Self-pay | Admitting: Cardiovascular Disease

## 2021-07-15 VITALS — BP 110/58 | HR 68 | Ht 65.0 in | Wt 167.0 lb

## 2021-07-15 DIAGNOSIS — M25569 Pain in unspecified knee: Secondary | ICD-10-CM

## 2021-07-15 DIAGNOSIS — I10 Essential (primary) hypertension: Secondary | ICD-10-CM

## 2021-07-15 DIAGNOSIS — E782 Mixed hyperlipidemia: Secondary | ICD-10-CM

## 2021-07-15 DIAGNOSIS — E118 Type 2 diabetes mellitus with unspecified complications: Secondary | ICD-10-CM

## 2021-07-15 DIAGNOSIS — I700 Atherosclerosis of aorta: Secondary | ICD-10-CM | POA: Diagnosis not present

## 2021-07-15 NOTE — Patient Instructions (Addendum)
Referral to Dr. Marry Guan, knee pain, bakers cyst [facility] [place], [place] [address] Office: [phone]   Referral for endocrine: Dr. Al[facility] [place], [place] [address] Office: [phone]   Medication Instructions:  No changes  If you need a refill on your cardiac medications before your next appointment, please call your pharmacy.    Lab work: No new labs needed   If you have labs (blood work) drawn today and your tests are completely normal, you will receive your results only by: [place] (if you have MyChart) OR A paper copy in the mail If you have any lab test that is abnormal or we need to change your treatment, we will call you to review the results.   Testing/Procedures: No new testing needed   Follow-Up: At [facility], you and your health needs are our priority.  As part of our continuing mission to provide you with exceptional heart care, we have created designated Provider Care Teams.  These Care Teams include your primary Cardiologist (physician) and Advanced Practice Providers (APPs -  Physician Assistants and Nurse Practitioners) who all work together to provide you with the care you need, when you need it.  You will need a follow up appointment as needed  Providers on your designated Care Team:   Murray Hodgkins, NP Christell Faith, PA-C Marrianne Mood, PA-C Cadence Kathlen Mody, [place]  Any Other Special Instructions Will Be Listed Below (If Applicable).  COVID-19 Vaccine Information can be found at: [email or web address].[email or web address] For questions related to vaccine distribution or appointments, please email [email or web address] or call [phone].

## 2021-07-15 NOTE — Progress Notes (Signed)
Cardiology Office Note  Date:  07/15/2021   ID:  Brittany Sherman 1946-04-12, MRN [id number]  PCP:  [facility]   Chief Complaint  Patient presents with   New Patient (Initial Visit)    Ref by Dr. Sharlet Sherman for an incidental finding of Aortic Atherosclerosis from a recent lung CT.  Patient c/o fatigue more than usual. Medications reviewed by the patient verbally.     HPI:  [name] is a 75 year old woman with past medical history of Hypertension Diabetes type 2 with complications, on insulin History of seizures Chronic low back pain, leg pain Smoker, quit 15 yr ago "Hx of CVA" 15 years ago Who presents by referral from [facility] Sherman for aortic atherosclerosis on CT scan  Reports husband died early 03/06/21, Lives with other family, very sedentary at baseline no regular exercise program Has some fatigue which he attributes to deconditioning from inactivity  Denies any chest pain concerning for angina Try to watch her diet given elevated sugars, continues to struggle, reports that she takes insulin once a week  CT scan chest reviewed Mild diffuse aortic atherosclerosis No significant coronary calcification Images pulled up and reviewed with her  Husband died early 03/06/21  Lab work reviewed A1C 12 Normal creatinine No recent lipid panel available, prior LDL 70 in 03-06-2018  Carotid reviewed with her LEFT CAROTID ARTERY: Similar minor intimal thickening and trace plaque formation. No hemodynamically significant left ICA stenosis, velocity elevation, or turbulent flow. LEFT VERTEBRAL ARTERY:  Normal antegrade flow IMPRESSION: Minor carotid atherosclerosis. Negative for stenosis. Degree of narrowing less than 50% bilaterally by ultrasound criteria.  EKG personally reviewed by myself on todays visit Normal sinus rhythm rate 68 bpm no significant ST-T wave changes   PMH:   has a past medical history of Arthritis, Benign neoplasm of rectum and anal  canal, Diabetes mellitus without complication ([place]), History of vertigo, Hypertension, Immune deficiency disorder ([place]), Infectious colitis, enteritis and gastroenteritis, Partial epilepsy with impairment of consciousness ([place]), Seizures ([place]), Stroke [facility]), Trigger finger of right thumb, and Vitamin D deficiency.  PSH:    Past Surgical History:  Procedure Laterality Date   ABDOMINAL HYSTERECTOMY     BACK SURGERY     COLONOSCOPY WITH PROPOFOL N/A 07/24/2017   Procedure: COLONOSCOPY WITH PROPOFOL;  Surgeon: Jonathon Bellows, MD;  Location: [facility] ENDOSCOPY;  Service: Endoscopy;  Laterality: N/A;   COLONOSCOPY WITH PROPOFOL N/A 08/03/2020   Procedure: COLONOSCOPY WITH PROPOFOL;  Surgeon: Jonathon Bellows, MD;  Location: [facility] ENDOSCOPY;  Service: Gastroenterology;  Laterality: N/A;   OOPHORECTOMY     SPINE SURGERY     TOTAL HIP ARTHROPLASTY  06-Mar-2016    Current Outpatient Medications  Medication Sig Dispense Refill   amLODipine (NORVASC) 5 MG tablet Take 1 tablet (5 mg total) by mouth daily. For further refills needs to see provider. 30 tablet 0   atorvastatin (LIPITOR) 40 MG tablet TAKE 1 TABLET BY MOUTH ONCE DAILY IN THE MORNING 90 tablet 0   brimonidine (ALPHAGAN) 0.2 % ophthalmic solution 1 drop 2 (two) times daily.     divalproex (DEPAKOTE) 250 MG DR tablet Take 250 mg by mouth 2 (two) times daily.     glimepiride (AMARYL) 4 MG tablet Take 1 tablet by mouth twice daily 180 tablet 0   insulin degludec (TRESIBA FLEXTOUCH) 100 UNIT/ML SOPN FlexTouch Pen Inject 0.3 mLs (30 Units total) into the skin daily at 10 pm. 3 mL 3   Insulin Pen Needle (PEN NEEDLES) 32G X  6 MM MISC 1 each by Does not apply route daily. 100 each 12   lamoTRIgine (LAMICTAL) 100 MG tablet Take 100 mg by mouth 2 (two) times daily.     quinapril-hydrochlorothiazide (ACCURETIC) 20-25 MG tablet TAKE 1 TABLET BY MOUTH ONCE DAILY 90 tablet 0   Vitamin D, Ergocalciferol, (DRISDOL) 50000 units CAPS capsule Take 1 capsule (50,000 Units total) by  mouth every 7 (seven) days. 12 capsule 3   empagliflozin (JARDIANCE) 25 MG TABS tablet Take 25 mg by mouth daily. (Patient not taking: Reported on 07/15/2021) 90 tablet 1   hydrOXYzine (ATARAX/VISTARIL) 25 MG tablet Take 1 tablet (25 mg total) by mouth every 6 (six) hours as needed for itching. (Patient not taking: No sig reported) 30 tablet 0   lamoTRIgine (LAMICTAL) 150 MG tablet Take 150 mg by mouth daily. (Patient not taking: No sig reported)     predniSONE (DELTASONE) 50 MG tablet Take 1 tablet (50 mg total) by mouth daily with breakfast. (Patient not taking: No sig reported) 5 tablet 0   No current facility-administered medications for this visit.    Allergies:   Aspirin, Metformin and related, and Penicillins   Social History:  The patient  reports that she quit smoking about 14 years ago. Her smoking use included cigarettes. She has a 64.50 pack-year smoking history. She has never used smokeless tobacco. She reports that she does not drink alcohol and does not use drugs.   Family History:   family history includes Aneurysm in her sister; Brain cancer in her mother; Cancer in her sister; Esophageal cancer in her sister.    Review of Systems: Review of Systems  Constitutional:  Positive for malaise/fatigue.  HENT: Negative.    Respiratory: Negative.    Cardiovascular: Negative.   Gastrointestinal: Negative.   Musculoskeletal: Negative.   Neurological: Negative.   Psychiatric/Behavioral: Negative.    All other systems reviewed and are negative.   PHYSICAL EXAM: VS:  BP (!) 110/58 (BP Location: Right Arm, Patient Position: Sitting, Cuff Size: Normal)   Pulse 68   Ht '5\' 5"'$  (1.651 m)   Wt 167 lb (75.8 kg)   SpO2 98%   BMI 27.79 kg/m  , BMI Body mass index is 27.79 kg/m. GEN: Well nourished, well developed, in no acute distress HEENT: normal Neck: no JVD, carotid bruits, or masses Cardiac: RRR; no murmurs, rubs, or gallops,no edema  Respiratory:  clear to auscultation  bilaterally, normal work of breathing GI: soft, nontender, nondistended, + BS MS: no deformity or atrophy Skin: warm and dry, no rash Neuro:  Strength and sensation are intact Psych: euthymic mood, full affect   Recent Labs: No results found for requested labs within last 8760 hours.    Lipid Panel Lab Results  Component Value Date   CHOL 135 07/18/2018   HDL 44 07/18/2018   LDLCALC 70 07/18/2018   TRIG 106 07/18/2018      Wt Readings from Last 3 Encounters:  07/15/21 167 lb (75.8 kg)  03/24/21 170 lb (77.1 kg)  08/03/20 170 lb 5.6 oz (77.3 kg)       ASSESSMENT AND PLAN:  Problem List Items Addressed This Visit       Cardiology Problems   Aortic atherosclerosis ([place]) - Primary   Other Visit Diagnoses     Type 2 diabetes mellitus with complications ([place])       Essential hypertension       Mixed hyperlipidemia          Aortic atherosclerosis  mild diffuse disease, images reviewed with her today, no significant coronary calcification She denies anginal symptoms Medical management recommended No further testing indicated Continue statin, she has already quit smoking 15 years ago Stressed importance of aggressive diabetes control, reports she is trying to watch her diet.  A1c markedly elevated 12 She is interested in additional medication options remaining with endocrinology At her request referral has been placed for endocrine at [facility] pain left Reports having Baker's cyst, cartilage problems, has chronic knee pain, reports getting worse' \\At'$  her request referral made to orthopedics type 2 diabetes with complications  Diabetes type 2 with complications At her request referral to endocrine, A1c of 12  Hyperlipidemia No recent numbers available, prior lipids at goal  no changes to the medications were made. Goal LDL less than 70   Total encounter time more than 60 minutes  Greater than 50% was spent in counseling and coordination of care with the  patient    Signed, Esmond Plants, M.D., Ph.D. [place], [place]

## 2021-07-18 ENCOUNTER — Telehealth: Payer: Self-pay | Admitting: Cardiovascular Disease

## 2021-07-18 NOTE — Telephone Encounter (Signed)
The patient was seen by Dr. Rockey Situ in the office on Friday 07/15/21. He requested that we refer the patient to:  Referral to Dr. Marry Guan, knee pain, bakers cyst [facility] [place], [place] [address] Office: [phone] Fax: [phone]     Referral for endocrine: Dr. [facility] [address] [place], [place] [address] Office: [phone] Fax: [phone]   I have faxed a copy of Dr. Donivan Scull 07/15/21 office note and patient demographics to both clinics listed/ fax #'s listed above.  Fax confirmation received for both clinics.

## 2021-08-02 ENCOUNTER — Other Ambulatory Visit: Payer: Self-pay | Admitting: Family Medicine

## 2021-08-02 DIAGNOSIS — Z13820 Encounter for screening for osteoporosis: Secondary | ICD-10-CM

## 2021-08-25 ENCOUNTER — Telehealth: Payer: Self-pay

## 2021-08-25 NOTE — Telephone Encounter (Signed)
Letter received that pt's referral to Endocrinology was been closed.  "Dr. Abbott Pao. [facility] Junes will NOT be seeing your patient as they have not responded to our calls. Please send a new referral once the patient has committed to being seen for his/her condition. Each case will be reviewed on a patient by patient basis."  Will forward to Dr. Rockey Situ as Juluis Rainier

## 2021-11-24 ENCOUNTER — Encounter: Payer: Self-pay | Admitting: Emergency Medicine

## 2021-11-24 ENCOUNTER — Other Ambulatory Visit: Payer: Self-pay

## 2021-11-24 ENCOUNTER — Emergency Department
Admission: EM | Admit: 2021-11-24 | Discharge: 2021-11-24 | Disposition: A | Discharge status: Home / Self Care | Payer: Medicare Other | Attending: Emergency Medicine | Admitting: Emergency Medicine

## 2021-11-24 DIAGNOSIS — H5712 Ocular pain, left eye: Secondary | ICD-10-CM | POA: Diagnosis present

## 2021-11-24 DIAGNOSIS — Z96643 Presence of artificial hip joint, bilateral: Secondary | ICD-10-CM | POA: Diagnosis not present

## 2021-11-24 DIAGNOSIS — Z85048 Personal history of other malignant neoplasm of rectum, rectosigmoid junction, and anus: Secondary | ICD-10-CM | POA: Insufficient documentation

## 2021-11-24 DIAGNOSIS — H00015 Hordeolum externum left lower eyelid: Secondary | ICD-10-CM | POA: Diagnosis not present

## 2021-11-24 DIAGNOSIS — I129 Hypertensive chronic kidney disease with stage 1 through stage 4 chronic kidney disease, or unspecified chronic kidney disease: Secondary | ICD-10-CM | POA: Diagnosis not present

## 2021-11-24 DIAGNOSIS — Z79899 Other long term (current) drug therapy: Secondary | ICD-10-CM | POA: Diagnosis not present

## 2021-11-24 DIAGNOSIS — Z794 Long term (current) use of insulin: Secondary | ICD-10-CM | POA: Diagnosis not present

## 2021-11-24 DIAGNOSIS — Z85038 Personal history of other malignant neoplasm of large intestine: Secondary | ICD-10-CM | POA: Insufficient documentation

## 2021-11-24 DIAGNOSIS — N183 Chronic kidney disease, stage 3 unspecified: Secondary | ICD-10-CM | POA: Insufficient documentation

## 2021-11-24 DIAGNOSIS — Z87891 Personal history of nicotine dependence: Secondary | ICD-10-CM | POA: Diagnosis not present

## 2021-11-24 DIAGNOSIS — Z7984 Long term (current) use of oral hypoglycemic drugs: Secondary | ICD-10-CM | POA: Insufficient documentation

## 2021-11-24 DIAGNOSIS — E1122 Type 2 diabetes mellitus with diabetic chronic kidney disease: Secondary | ICD-10-CM | POA: Insufficient documentation

## 2021-11-24 NOTE — Discharge Instructions (Addendum)
Continue using the warm compresses.

## 2021-11-24 NOTE — ED Notes (Signed)
First Nurse Note:  Pt to ED via POV stating that she woke up this morning with pain in the left side of her face. Pt is in NAD at this time.

## 2021-11-24 NOTE — ED Triage Notes (Signed)
Pt comes into the ED via POV c/o  left eye and left side face pain that started last night.  Pt has a current stye on the left eye.  Pt in NAD at this time with even and unlabored respirations.

## 2021-11-24 NOTE — ED Provider Notes (Signed)
[facility]  ____________________________________________   Event Date/Time   First MD Initiated Contact with Patient 11/24/21 [phone]     (approximate)  I have reviewed the triage vital signs and the nursing notes.   HISTORY  Chief Complaint Eye Pain and Facial Pain    HPI Brittany Sherman is a 75 y.o. female with pmh HTN, seizures, diabetes who presents with eye pain.  Patient noticed swelling in her left lower eyelid about a week ago.  She saw her primary doctor 2 days ago who recommended warm compresses which she has been doing about 2-3 times per day.  This morning when she woke up she had pain in the whole left side of her face and was concerned that the eye was infected.  Facial pain is since resolved.  She denies fevers chills or swelling.  She denies ear pain.  Denies difficulty seeing or pain with extraocular movements.         Past Medical History:  Diagnosis Date   Arthritis    Benign neoplasm of rectum and anal canal    Diabetes mellitus without complication (HCC)    History of vertigo    Hypertension    Immune deficiency disorder (HCC)    Infectious colitis, enteritis and gastroenteritis    Partial epilepsy with impairment of consciousness (HCC)    Seizures ([place])    Stroke (HCC)    Trigger finger of right thumb    Vitamin D deficiency     Patient Active Problem List   Diagnosis Date Noted   Yeast infection 12/24/2018   Aortic atherosclerosis ([place]) 08/12/2018   Adrenal nodule ([place]) 08/12/2018   Baker's cyst of knee, left 08/13/2017   Traumatic avulsion of nail plate of finger 16/38/4665   Osteoarthritis 02/27/2017   CKD (chronic kidney disease), stage III ([place]) 02/27/2017   Hyperlipidemia associated with type 2 diabetes mellitus ([place]) 02/27/2017   Benign hypertensive renal disease    Uncontrolled type 2 diabetes with neuropathy    History of stroke without residual deficits    Vitamin D deficiency    Trigger finger of right  thumb    Benign neoplasm of colon    Partial epilepsy with impairment of consciousness ([place])     Past Surgical History:  Procedure Laterality Date   ABDOMINAL HYSTERECTOMY     BACK SURGERY     COLONOSCOPY WITH PROPOFOL N/A 07/24/2017   Procedure: COLONOSCOPY WITH PROPOFOL;  Surgeon: Jonathon Bellows, MD;  Location: [facility] ENDOSCOPY;  Service: Endoscopy;  Laterality: N/A;   COLONOSCOPY WITH PROPOFOL N/A 08/03/2020   Procedure: COLONOSCOPY WITH PROPOFOL;  Surgeon: Jonathon Bellows, MD;  Location: [facility] ENDOSCOPY;  Service: Gastroenterology;  Laterality: N/A;   OOPHORECTOMY     SPINE SURGERY     TOTAL HIP ARTHROPLASTY  2017    Prior to Admission medications   Medication Sig Start Date End Date Taking? Authorizing Provider  amLODipine (NORVASC) 5 MG tablet Take 1 tablet (5 mg total) by mouth daily. For further refills needs to see provider. 07/29/19   Cannady, Henrine Screws T, NP  atorvastatin (LIPITOR) 40 MG tablet TAKE 1 TABLET BY MOUTH ONCE DAILY IN THE MORNING 05/09/19   Johnson, Megan P, DO  brimonidine (ALPHAGAN) 0.2 % ophthalmic solution 1 drop 2 (two) times daily. 10/21/19   [provider]  divalproex (DEPAKOTE) 250 MG DR tablet Take 250 mg by mouth 2 (two) times daily. 09/16/15   [provider]  empagliflozin (JARDIANCE) 25 MG TABS tablet Take 25  mg by mouth daily. Patient not taking: Reported on 07/15/2021 01/22/19   [place] Liter P, DO  glimepiride (AMARYL) 4 MG tablet Take 1 tablet by mouth twice daily 04/23/19   Park Liter P, DO  hydrOXYzine (ATARAX/VISTARIL) 25 MG tablet Take 1 tablet (25 mg total) by mouth every 6 (six) hours as needed for itching. Patient not taking: No sig reported 06/09/20   Letitia Neri L, PA-C  insulin degludec (TRESIBA FLEXTOUCH) 100 UNIT/ML SOPN FlexTouch Pen Inject 0.3 mLs (30 Units total) into the skin daily at 10 pm. 07/18/18   Park Liter P, DO  Insulin Pen Needle (PEN NEEDLES) 32G X 6 MM MISC 1 each by Does not apply route daily. 07/18/18   Johnson,  Megan P, DO  lamoTRIgine (LAMICTAL) 100 MG tablet Take 100 mg by mouth 2 (two) times daily. 09/23/15   [provider]  lamoTRIgine (LAMICTAL) 150 MG tablet Take 150 mg by mouth daily. Patient not taking: No sig reported 11/18/19   [provider]  predniSONE (DELTASONE) 50 MG tablet Take 1 tablet (50 mg total) by mouth daily with breakfast. Patient not taking: No sig reported 05/08/20   Cuthriell, Charline Bills, PA-C  quinapril-hydrochlorothiazide (ACCURETIC) 20-25 MG tablet TAKE 1 TABLET BY MOUTH ONCE DAILY 01/16/19   Johnson, Megan P, DO  Vitamin D, Ergocalciferol, (DRISDOL) 50000 units CAPS capsule Take 1 capsule (50,000 Units total) by mouth every 7 (seven) days. 07/19/18   Volney American, PA-C    Allergies Aspirin, Metformin and related, and Penicillins  Family History  Problem Relation Age of Onset   Brain cancer Mother    Esophageal cancer Sister    Aneurysm Sister    Cancer Sister    Breast cancer Neg Hx     Social History Social History   Tobacco Use   Smoking status: Former    Packs/day: 1.50    Years: 43.00    Pack years: 64.50    Types: Cigarettes    Quit date: 12/18/2006    Years since quitting: 14.9   Smokeless tobacco: Never  Vaping Use   Vaping Use: Never used  Substance Use Topics   Alcohol use: No   Drug use: No    Review of Systems   Review of Systems  Constitutional:  Negative for chills and fever.  Eyes:  Positive for pain. Negative for photophobia and visual disturbance.  All other systems reviewed and are negative.  Physical Exam Updated Vital Signs BP (!) 171/83   Pulse 79   Temp 98.6 F (37 C) (Oral)   Resp 16   Ht 5\' 5"  (1.651 m)   Wt 75.8 kg   SpO2 96%   BMI 27.81 kg/m   Physical Exam Vitals and nursing note reviewed.  Constitutional:      General: She is not in acute distress.    Appearance: Normal appearance.  HENT:     Head: Normocephalic and atraumatic.  Eyes:     General: No scleral icterus.     Conjunctiva/sclera: Conjunctivae normal.     Comments: Hordeolum on the L lower eyelid, no edema, erythema or drainage  PERRLA, nml EOM, no pain w/ EOM  Pulmonary:     Effort: Pulmonary effort is normal. No respiratory distress.     Breath sounds: No stridor.  Musculoskeletal:        General: No deformity or signs of injury.     Cervical back: Normal range of motion.  Skin:    General: Skin is dry.  Coloration: Skin is not jaundiced or pale.  Neurological:     General: No focal deficit present.     Mental Status: She is alert and oriented to person, place, and time. Mental status is at baseline.  Psychiatric:        Mood and Affect: Mood normal.        Behavior: Behavior normal.     LABS (all labs ordered are listed, but only abnormal results are displayed)  Labs Reviewed - No data to display ____________________________________________  EKG  N/a ____________________________________________  RADIOLOGY Almeta Monas, personally viewed and evaluated these images (plain radiographs) as part of my medical decision making, as well as reviewing the written report by the radiologist.  ED MD interpretation:  n/a    ____________________________________________   PROCEDURES  Procedure(s) performed (including Critical Care):  Procedures   ____________________________________________   INITIAL IMPRESSION / ASSESSMENT AND PLAN / ED COURSE     75 year old female presents with an uncomplicated hordeolum.  This is been going on for about a week and she started with warm compresses about 2 days ago.  This morning woke up with left-sided facial pain which is since resolved.  She was concerned about infection.  On exam she has a hordeolum of the left lower eyelid without signs of blepharitis or preseptal or septal cellulitis.  She has no pain of the globe or visual change.  Recommended that she continue with the warm compresses.  There is no evidence of infection.  She  will follow-up with her primary care provider.      ____________________________________________   FINAL CLINICAL IMPRESSION(S) / ED DIAGNOSES  Final diagnoses:  Hordeolum externum of left lower eyelid     ED Discharge Orders     None        Note:  This document was prepared using Dragon voice recognition software and may include unintentional dictation errors.    Rada Hay, MD 11/24/21 [phone]

## 2021-11-25 ENCOUNTER — Emergency Department
Admission: EM | Admit: 2021-11-25 | Discharge: 2021-11-25 | Disposition: A | Discharge status: Home / Self Care | Payer: Medicare Other | Attending: Emergency Medicine | Admitting: Emergency Medicine

## 2021-11-25 ENCOUNTER — Encounter: Payer: Self-pay | Admitting: Emergency Medicine

## 2021-11-25 ENCOUNTER — Emergency Department: Payer: Medicare Other

## 2021-11-25 ENCOUNTER — Other Ambulatory Visit: Payer: Self-pay

## 2021-11-25 DIAGNOSIS — Z96649 Presence of unspecified artificial hip joint: Secondary | ICD-10-CM | POA: Diagnosis not present

## 2021-11-25 DIAGNOSIS — Z7984 Long term (current) use of oral hypoglycemic drugs: Secondary | ICD-10-CM | POA: Diagnosis not present

## 2021-11-25 DIAGNOSIS — E1122 Type 2 diabetes mellitus with diabetic chronic kidney disease: Secondary | ICD-10-CM | POA: Diagnosis not present

## 2021-11-25 DIAGNOSIS — Z794 Long term (current) use of insulin: Secondary | ICD-10-CM | POA: Insufficient documentation

## 2021-11-25 DIAGNOSIS — I129 Hypertensive chronic kidney disease with stage 1 through stage 4 chronic kidney disease, or unspecified chronic kidney disease: Secondary | ICD-10-CM | POA: Diagnosis not present

## 2021-11-25 DIAGNOSIS — N183 Chronic kidney disease, stage 3 unspecified: Secondary | ICD-10-CM | POA: Diagnosis not present

## 2021-11-25 DIAGNOSIS — J0141 Acute recurrent pansinusitis: Secondary | ICD-10-CM | POA: Diagnosis not present

## 2021-11-25 DIAGNOSIS — Z79899 Other long term (current) drug therapy: Secondary | ICD-10-CM | POA: Insufficient documentation

## 2021-11-25 DIAGNOSIS — Z87891 Personal history of nicotine dependence: Secondary | ICD-10-CM | POA: Insufficient documentation

## 2021-11-25 DIAGNOSIS — R519 Headache, unspecified: Secondary | ICD-10-CM | POA: Diagnosis present

## 2021-11-25 LAB — CBC WITH DIFFERENTIAL/PLATELET
Abs Immature Granulocytes: 0.04 10*3/uL (ref 0.00–0.07)
Basophils Absolute: 0 10*3/uL (ref 0.0–0.1)
Basophils Relative: 0 %
Eosinophils Absolute: 0 10*3/uL (ref 0.0–0.5)
Eosinophils Relative: 0 %
HCT: 41.3 % (ref 36.0–46.0)
Hemoglobin: 13.3 g/dL (ref 12.0–15.0)
Immature Granulocytes: 1 %
Lymphocytes Relative: 32 %
Lymphs Abs: 2.2 10*3/uL (ref 0.7–4.0)
MCH: 26.4 pg (ref 26.0–34.0)
MCHC: 32.2 g/dL (ref 30.0–36.0)
MCV: 81.9 fL (ref 80.0–100.0)
Monocytes Absolute: 0.7 10*3/uL (ref 0.1–1.0)
Monocytes Relative: 11 %
Neutro Abs: 3.7 10*3/uL (ref 1.7–7.7)
Neutrophils Relative %: 56 %
Platelets: 283 10*3/uL (ref 150–400)
RBC: 5.04 MIL/uL (ref 3.87–5.11)
RDW: 15.7 % — ABNORMAL HIGH (ref 11.5–15.5)
WBC: 6.7 10*3/uL (ref 4.0–10.5)
nRBC: 0 % (ref 0.0–0.2)

## 2021-11-25 LAB — COMPREHENSIVE METABOLIC PANEL
ALT: 13 U/L (ref 0–44)
AST: 14 U/L — ABNORMAL LOW (ref 15–41)
Albumin: 3.8 g/dL (ref 3.5–5.0)
Alkaline Phosphatase: 61 U/L (ref 38–126)
Anion gap: 6 (ref 5–15)
BUN: 19 mg/dL (ref 8–23)
CO2: 27 mmol/L (ref 22–32)
Calcium: 9.5 mg/dL (ref 8.9–10.3)
Chloride: 100 mmol/L (ref 98–111)
Creatinine, Ser: 0.81 mg/dL (ref 0.44–1.00)
GFR, Estimated: >60 mL/min (ref >60)
Glucose, Bld: 429 mg/dL — ABNORMAL HIGH (ref 70–99)
Potassium: 4 mmol/L (ref 3.5–5.1)
Sodium: 133 mmol/L — ABNORMAL LOW (ref 135–145)
Total Bilirubin: 0.5 mg/dL (ref 0.3–1.2)
Total Protein: 7.8 g/dL (ref 6.5–8.1)

## 2021-11-25 LAB — PROTIME-INR
INR: 0.9 (ref 0.8–1.2)
Prothrombin Time: 12.6 seconds (ref 11.4–15.2)

## 2021-11-25 LAB — TROPONIN I (HIGH SENSITIVITY)
Troponin I (High Sensitivity): 13 ng/L (ref <18)
Troponin I (High Sensitivity): 13 ng/L (ref <18)

## 2021-11-25 LAB — CBG MONITORING, ED
Glucose-Capillary: 335 mg/dL — ABNORMAL HIGH (ref 70–99)
Glucose-Capillary: 280 mg/dL — ABNORMAL HIGH (ref 70–99)

## 2021-11-25 MED ORDER — IBUPROFEN 600 MG PO TABS
600.0000 mg | ORAL_TABLET | Freq: Three times a day (TID) | ORAL | 0 refills | Status: DC | PRN
Start: 2021-11-25 — End: 2024-05-17

## 2021-11-25 MED ORDER — DIPHENHYDRAMINE HCL 50 MG/ML IJ SOLN
12.5000 mg | Freq: Once | INTRAMUSCULAR | Status: AC
  Administered 2021-11-25: 12.5 mg via INTRAVENOUS
  Filled 2021-11-25: qty 1

## 2021-11-25 MED ORDER — IOHEXOL 350 MG/ML SOLN
75.0000 mL | Freq: Once | INTRAVENOUS | Status: AC | PRN
  Administered 2021-11-25: 75 mL via INTRAVENOUS

## 2021-11-25 MED ORDER — INSULIN ASPART 100 UNIT/ML IJ SOLN
7.0000 [IU] | Freq: Once | INTRAMUSCULAR | Status: AC
  Administered 2021-11-25: 7 [IU] via SUBCUTANEOUS
  Filled 2021-11-25: qty 1

## 2021-11-25 MED ORDER — PROCHLORPERAZINE EDISYLATE 10 MG/2ML IJ SOLN
5.0000 mg | Freq: Once | INTRAMUSCULAR | Status: AC
  Administered 2021-11-25: 5 mg via INTRAVENOUS
  Filled 2021-11-25: qty 2

## 2021-11-25 MED ORDER — ACETAMINOPHEN 500 MG PO TABS
1000.0000 mg | ORAL_TABLET | Freq: Once | ORAL | Status: AC
  Administered 2021-11-25: 1000 mg via ORAL
  Filled 2021-11-25: qty 2

## 2021-11-25 MED ORDER — DOXYCYCLINE HYCLATE 100 MG PO CAPS
100.0000 mg | ORAL_CAPSULE | Freq: Two times a day (BID) | ORAL | 0 refills | Status: AC
Start: 2021-11-25 — End: 2021-12-05

## 2021-11-25 MED ORDER — SODIUM CHLORIDE 0.9 % IV BOLUS
500.0000 mL | Freq: Once | INTRAVENOUS | Status: AC
  Administered 2021-11-25: 500 mL via INTRAVENOUS

## 2021-11-25 NOTE — ED Notes (Signed)
This RN called pt's friend "Red" at [phone] for pick up. Red states she will come pick pt. Up from [place].

## 2021-11-25 NOTE — ED Triage Notes (Signed)
Pt to ED via POV with c/o Headache x 1 week that has been worsening. Pt states has been unable to sleep due to HA, Pt seen 12/8 for stye to L eye. Pt also c/o pain to L shoulder that radiates up L neck and into head. Pt states pain 10/10, states pain is unchanged with position changes/movement.

## 2021-11-25 NOTE — ED Provider Notes (Signed)
Emergency department handoff note  Care of this patient was signed out to me at the end of the previous provider shift.  All pertinent patient information was conveyed and all questions were answered.  Patient pending repeat blood glucose that is downtrending after insulin.  The patient has been reexamined and is ready to be discharged.  All diagnostic results have been reviewed and discussed with the patient/family.  Care plan has been outlined and the patient/family understands all current diagnoses, results, and treatment plans.  There are no new complaints, changes, or physical findings at this time.  All questions have been addressed and answered.  All medications, if any, that were given while in the emergency department or any that are being prescribed have been reviewed with the patient/family.  All side effects and adverse reactions have been explained.  Patient was instructed to, and agrees to follow-up with their primary care physician as well as return to the emergency department if any new or worsening symptoms develop.   Naaman Plummer, MD 11/25/21 [phone]

## 2021-11-25 NOTE — ED Notes (Signed)
CT at bedside 

## 2021-11-25 NOTE — ED Provider Notes (Signed)
[facility] Emergency Department Provider Note  ____________________________________________   Event Date/Time   First MD Initiated Contact with Patient 11/25/21 0340     (approximate)  I have reviewed the triage vital signs and the nursing notes.   HISTORY  Chief Complaint Headache    HPI Brittany Sherman is a 75 y.o. female with family history of cerebral aneurysms here with headache.  The patient states that for the last several days, she has had fairly constant, diffuse, but primarily frontal headache.  Headache began fairly acutely and has persisted and been nearly daily since onset.  She describes some associated tension type pain in her neck with bilateral paraspinal tenderness that is worse with movement and palpation.  Denies history of migraines.  She has a family history of aneurysms.  She has a history of stroke and was told that she had a small aneurysm in her posterior head, though she has not had this screened.  Denies any blood thinner use.  No vision changes.  No focal numbness or weakness.  No specific alleviating or aggravating factors.  No fevers or chills.  No neck stiffness.    Past Medical History:  Diagnosis Date   Arthritis    Benign neoplasm of rectum and anal canal    Diabetes mellitus without complication (HCC)    History of vertigo    Hypertension    Immune deficiency disorder (HCC)    Infectious colitis, enteritis and gastroenteritis    Partial epilepsy with impairment of consciousness (HCC)    Seizures ([place])    Stroke (HCC)    Trigger finger of right thumb    Vitamin D deficiency     Patient Active Problem List   Diagnosis Date Noted   Yeast infection 12/24/2018   Aortic atherosclerosis ([place]) 08/12/2018   Adrenal nodule ([place]) 08/12/2018   Baker's cyst of knee, left 08/13/2017   Traumatic avulsion of nail plate of finger 73/53/2992   Osteoarthritis 02/27/2017   CKD (chronic kidney disease), stage III ([place])  02/27/2017   Hyperlipidemia associated with type 2 diabetes mellitus ([place]) 02/27/2017   Benign hypertensive renal disease    Uncontrolled type 2 diabetes with neuropathy    History of stroke without residual deficits    Vitamin D deficiency    Trigger finger of right thumb    Benign neoplasm of colon    Partial epilepsy with impairment of consciousness ([place])     Past Surgical History:  Procedure Laterality Date   ABDOMINAL HYSTERECTOMY     BACK SURGERY     COLONOSCOPY WITH PROPOFOL N/A 07/24/2017   Procedure: COLONOSCOPY WITH PROPOFOL;  Surgeon: Jonathon Bellows, MD;  Location: [facility] ENDOSCOPY;  Service: Endoscopy;  Laterality: N/A;   COLONOSCOPY WITH PROPOFOL N/A 08/03/2020   Procedure: COLONOSCOPY WITH PROPOFOL;  Surgeon: Jonathon Bellows, MD;  Location: [facility] ENDOSCOPY;  Service: Gastroenterology;  Laterality: N/A;   OOPHORECTOMY     SPINE SURGERY     TOTAL HIP ARTHROPLASTY  2017    Prior to Admission medications   Medication Sig Start Date End Date Taking? Authorizing Provider  doxycycline (VIBRAMYCIN) 100 MG capsule Take 1 capsule (100 mg total) by mouth 2 (two) times daily for 10 days. 11/25/21 12/05/21 Yes Duffy Bruce, MD  ibuprofen (ADVIL) 600 MG tablet Take 1 tablet (600 mg total) by mouth every 8 (eight) hours as needed for moderate pain or headache. 11/25/21  Yes Duffy Bruce, MD  amLODipine (NORVASC) 5 MG tablet Take 1 tablet (5 mg  total) by mouth daily. For further refills needs to see provider. 07/29/19   Cannady, Henrine Screws T, NP  atorvastatin (LIPITOR) 40 MG tablet TAKE 1 TABLET BY MOUTH ONCE DAILY IN THE MORNING 05/09/19   Johnson, Megan P, DO  brimonidine (ALPHAGAN) 0.2 % ophthalmic solution 1 drop 2 (two) times daily. 10/21/19   [provider]  divalproex (DEPAKOTE) 250 MG DR tablet Take 250 mg by mouth 2 (two) times daily. 09/16/15   [provider]  empagliflozin (JARDIANCE) 25 MG TABS tablet Take 25 mg by mouth daily. Patient not taking: Reported on 07/15/2021  01/22/19   Park Liter P, DO  glimepiride (AMARYL) 4 MG tablet Take 1 tablet by mouth twice daily 04/23/19   Park Liter P, DO  hydrOXYzine (ATARAX/VISTARIL) 25 MG tablet Take 1 tablet (25 mg total) by mouth every 6 (six) hours as needed for itching. Patient not taking: No sig reported 06/09/20   Letitia Neri L, PA-C  insulin degludec (TRESIBA FLEXTOUCH) 100 UNIT/ML SOPN FlexTouch Pen Inject 0.3 mLs (30 Units total) into the skin daily at 10 pm. 07/18/18   Park Liter P, DO  Insulin Pen Needle (PEN NEEDLES) 32G X 6 MM MISC 1 each by Does not apply route daily. 07/18/18   Johnson, Megan P, DO  lamoTRIgine (LAMICTAL) 100 MG tablet Take 100 mg by mouth 2 (two) times daily. 09/23/15   [provider]  lamoTRIgine (LAMICTAL) 150 MG tablet Take 150 mg by mouth daily. Patient not taking: No sig reported 11/18/19   [provider]  predniSONE (DELTASONE) 50 MG tablet Take 1 tablet (50 mg total) by mouth daily with breakfast. Patient not taking: No sig reported 05/08/20   Cuthriell, Charline Bills, PA-C  quinapril-hydrochlorothiazide (ACCURETIC) 20-25 MG tablet TAKE 1 TABLET BY MOUTH ONCE DAILY 01/16/19   Johnson, Megan P, DO  Vitamin D, Ergocalciferol, (DRISDOL) 50000 units CAPS capsule Take 1 capsule (50,000 Units total) by mouth every 7 (seven) days. 07/19/18   Volney American, PA-C    Allergies Aspirin, Metformin and related, and Penicillins  Family History  Problem Relation Age of Onset   Brain cancer Mother    Esophageal cancer Sister    Aneurysm Sister    Cancer Sister    Breast cancer Neg Hx     Social History Social History   Tobacco Use   Smoking status: Former    Packs/day: 1.50    Years: 43.00    Pack years: 64.50    Types: Cigarettes    Quit date: 12/18/2006    Years since quitting: 14.9   Smokeless tobacco: Never  Vaping Use   Vaping Use: Never used  Substance Use Topics   Alcohol use: No   Drug use: No    Review of Systems  Review of Systems   Constitutional:  Positive for fatigue. Negative for fever.  HENT:  Negative for congestion and sore throat.   Eyes:  Negative for visual disturbance.  Respiratory:  Negative for cough and shortness of breath.   Cardiovascular:  Negative for chest pain.  Gastrointestinal:  Negative for abdominal pain, diarrhea, nausea and vomiting.  Genitourinary:  Negative for flank pain.  Musculoskeletal:  Positive for neck pain. Negative for back pain.  Skin:  Negative for rash and wound.  Neurological:  Positive for headaches. Negative for weakness.  All other systems reviewed and are negative.   ____________________________________________  PHYSICAL EXAM:      VITAL SIGNS: ED Triage Vitals  Enc Vitals Group  BP 11/25/21 0148 (!) 149/72     Pulse Rate 11/25/21 0148 76     Resp 11/25/21 0148 18     Temp 11/25/21 0148 98.2 F (36.8 C)     Temp Source 11/25/21 0148 Oral     SpO2 11/25/21 0148 100 %     Weight --      Height --      Head Circumference --      Peak Flow --      Pain Score 11/25/21 0154 10     Pain Loc --      Pain Edu? --      Excl. in [place]? --      Physical Exam Vitals and nursing note reviewed.  Constitutional:      General: She is not in acute distress.    Appearance: She is well-developed.  HENT:     Head: Normocephalic and atraumatic.  Eyes:     Conjunctiva/sclera: Conjunctivae normal.  Neck:     Comments: Mild right greater than left paraspinal tenderness.  No bruising or deformity. Cardiovascular:     Rate and Rhythm: Normal rate and regular rhythm.     Heart sounds: Normal heart sounds.  Pulmonary:     Effort: Pulmonary effort is normal. No respiratory distress.     Breath sounds: No wheezing.  Abdominal:     General: There is no distension.  Musculoskeletal:     Cervical back: Neck supple.  Skin:    General: Skin is warm.     Capillary Refill: Capillary refill takes less than 2 seconds.     Findings: No rash.  Neurological:     Mental Status:  She is alert and oriented to person, place, and time.     Motor: No abnormal muscle tone.     Comments: Neurological Exam:  Mental Status: Alert and oriented to person, place, and time. Attention and concentration normal. Speech clear. Recent memory is intact. Cranial Nerves: Visual fields grossly intact. EOMI and PERRLA. No nystagmus noted. Facial sensation intact at forehead, maxillary cheek, and chin/mandible bilaterally. No facial asymmetry or weakness. Hearing grossly normal. Uvula is midline, and palate elevates symmetrically. Normal SCM and trapezius strength. Tongue midline without fasciculations. Motor: Muscle strength 5/5 in proximal and distal UE and LE bilaterally. No pronator drift. Muscle tone normal. Sensation: Intact to light touch in upper and lower extremities distally bilaterally.  Coordination: Normal FTN bilaterally.         ____________________________________________   LABS (all labs ordered are listed, but only abnormal results are displayed)  Labs Reviewed  CBC WITH DIFFERENTIAL/PLATELET - Abnormal; Notable for the following components:      Result Value   RDW 15.7 (*)    All other components within normal limits  COMPREHENSIVE METABOLIC PANEL - Abnormal; Notable for the following components:   Sodium 133 (*)    Glucose, Bld 429 (*)    AST 14 (*)    All other components within normal limits  CBG MONITORING, ED - Abnormal; Notable for the following components:   Glucose-Capillary 335 (*)    All other components within normal limits  CBG MONITORING, ED - Abnormal; Notable for the following components:   Glucose-Capillary 280 (*)    All other components within normal limits  PROTIME-INR  TROPONIN I (HIGH SENSITIVITY)  TROPONIN I (HIGH SENSITIVITY)    ____________________________________________  EKG: Normal sinus rhythm, ventricular rate 74.  PR 216, QRS 88, QTc 417.  No acute ST elevations or depressions.  No acute events of acute ischemia or infarct.   Nonspecific T wave changes. ________________________________________  RADIOLOGY All imaging, including plain films, CT scans, and ultrasounds, independently reviewed by me, and interpretations confirmed via formal radiology reads.  ED MD interpretation:   CT angio head/neck: no acute abnormality, no aneurysms, significant paranasal sinus disease, atelectasis noted  Official radiology report(s): CT ANGIO HEAD NECK W WO CM  Result Date: 11/25/2021 CLINICAL DATA:  76 year old female with headache for 1 week. EXAM: CT ANGIOGRAPHY HEAD AND NECK TECHNIQUE: Multidetector CT imaging of the head and neck was performed using the standard protocol during bolus administration of intravenous contrast. Multiplanar CT image reconstructions and MIPs were obtained to evaluate the vascular anatomy. Carotid stenosis measurements (when applicable) are obtained utilizing NASCET criteria, using the distal internal carotid diameter as the denominator. CONTRAST:  29mL OMNIPAQUE IOHEXOL 350 MG/ML SOLN COMPARISON:  Carotid Doppler ultrasound 07/01/2021. Head CT 12/11/2015 and earlier. FINDINGS: CT HEAD Brain: Cerebral volume not significantly changed since 2016. Chronic partially empty sella. No midline shift, ventriculomegaly, mass effect, evidence of mass lesion, intracranial hemorrhage or evidence of cortically based acute infarction. Patchy mild to moderate for age bilateral cerebral white matter hypodensity has progressed since 2016. No cortical encephalomalacia identified. Calvarium and skull base: No acute osseous abnormality identified. Chronic hyperostosis of the calvarium. Paranasal sinuses: Progressed paranasal sinus disease since 2016. Opacified left posterior ethmoids and left sphenoid sinus with pronounced sphenoid mucoperiosteal thickening new since 2016, left greater than right. Mild maxillary mucosal thickening. No sinus fluid level identified. Tympanic cavities and mastoids remain well aerated. Orbits: No acute  orbit or scalp soft tissue finding. CTA NECK Skeleton: Absent dentition. Cervical spine degeneration. No acute osseous abnormality identified. Upper chest: Mild right upper lobe emphysema, centrilobular. Patchy mostly dependent upper lobe ground-glass opacity. Favor a combination of scarring and atelectasis. No superior mediastinal lymphadenopathy. Other neck: Negative. Aortic arch: Mild to moderate calcified arch atherosclerosis. Three vessel arch configuration. Right carotid system: Mild brachiocephalic origin plaque without stenosis. Tortuous proximal right CCA without plaque or stenosis. Minimal calcified plaque at the right carotid bifurcation. Partially retropharyngeal but otherwise normal cervical right ICA. Left carotid system: Tortuous left CCA and cervical left ICA but no plaque or stenosis. But there is a mildly beaded appearance of the cervical ICAs, such as the left on series 16, image 27, raising the possibility of Fibromuscular Dysplasia (FMD). Vertebral arteries: Minimal plaque in the proximal right subclavian artery without stenosis. Normal right vertebral artery origin. Right vertebral is mildly tortuous but patent to the skull base without stenosis. Mild to moderate plaque in the proximal left subclavian artery without stenosis. Tortuous left vertebral artery origin and V1 segment with no convincing plaque or stenosis. Left vertebral remains patent to the skull base with no stenosis. CTA HEAD Posterior circulation: Codominant vertebral V4 segments. Mild irregularity on the right. No significant stenosis. Patent vertebrobasilar junction. Patent basilar artery without stenosis. Patent SCA and PCA origins. Posterior communicating arteries are diminutive or absent. Bilateral PCA branches are within normal limits. Anterior circulation: Patent ICA siphons. Generally mild left siphon calcified plaque with no siphon stenosis. Small atherosclerotic pseudo lesion suspected in the left cavernous segment on  series 11, image 101. On the right side there is mild to moderate calcified siphon plaque but only mild distal cavernous stenosis. Patent carotid termini. Patent MCA and ACA origins. Anterior communicating artery is diminutive or absent. Bilateral ACA branches are within normal limits. Left MCA M1 segment is patent to the bifurcation.  Left MCA bifurcation is mildly ectatic but with no stenosis or discrete aneurysm. Left MCA branches are within normal limits. Right MCA M1 segment and bifurcation are patent without stenosis. Right MCA branches are within normal limits. Venous sinuses: Early contrast timing. Superior sagittal sinus is patent. Anatomic variants: None. Review of the MIP images confirms the above findings IMPRESSION: 1. Negative for large vessel occlusion. Minimal atherosclerosis in the neck, although possible cervical carotid Fibromuscular Dysplasia (FMD). Mild intracranial atherosclerosis with no significant stenosis. 2. No acute intracranial abnormality. Progressed bilateral cerebral white matter disease since April 02, 2015, most commonly due to small vessel ischemia. 3. Progressed paranasal sinus disease since 2015-04-02, with pronounced sphenoid sinus mucoperiosteal thickening greater on the left. 4. Patchy upper lobe ground-glass opacity, nonspecific but favor a combination of atelectasis and scarring. 5. Aortic Atherosclerosis (ICD10-I70.0). Electronically Signed   By: Genevie Ann M.D.   On: 11/25/2021 06:38    ____________________________________________  PROCEDURES   Procedure(s) performed (including Critical Care):  Procedures  ____________________________________________  INITIAL IMPRESSION / MDM / [place] / ED COURSE  As part of my medical decision making, I reviewed the following data within the [place] notes reviewed and incorporated, Old chart reviewed, Notes from prior ED visits, and [place] Controlled Substance Database       *MARIESHA VENTURELLA was  evaluated in Emergency Department on 11/25/2021 for the symptoms described in the history of present illness. She was evaluated in the context of the global COVID-19 pandemic, which necessitated consideration that the patient might be at risk for infection with the SARS-CoV-2 virus that causes COVID-19. Institutional protocols and algorithms that pertain to the evaluation of patients at risk for COVID-19 are in a state of rapid change based on information released by regulatory bodies including the CDC and federal and state organizations. These policies and algorithms were followed during the patient's care in the ED.  Some ED evaluations and interventions may be delayed as a result of limited staffing during the pandemic.*     Medical Decision Making:  75 yo F here with new headache, x 1 week, with no focal neuro deficits. DDx includes tension HA, migraine, sinusitis, intracranial mass/lesion, but also SAH/aneurysmal bleed given family history. Will check CT Angio, tx supportively. No fever, leukocytosis, or signs to suggest meningitis or encephalitis. No focal neuro deficits. CMP unremarkable. INR normal, not on anticoagulants. Re: neck pain - suspect this is MSK as well. No UE weakness, numbness or signs of neuro compromise/radiculopathy. EKG is nonischemic and trop neg x 2 - do not suspect referred cardiac etiology. Will f/u CT, reassess.  CT scan shows no significant atherosclerosis, aneurysm, or LVO. There is a question of FMD but this has been previously noted as normal on prior MRIs, and pt is already optimized on ASA daily, antiHTN medications. Pt does incidentally have paranasal sinusitis, and this correlates with her description of pain causing her HA.  Given very reassuring CT Angio, resolution of HA in ED, and reassuring labs/vitals, feel its reasonable to d/c with tx for sinusitis with good return precautions.   ____________________________________________  FINAL CLINICAL IMPRESSION(S) / ED  DIAGNOSES  Final diagnoses:  Acute recurrent pansinusitis     MEDICATIONS GIVEN DURING THIS VISIT:  Medications  prochlorperazine (COMPAZINE) injection 5 mg (5 mg Intravenous Given 11/25/21 0429)  diphenhydrAMINE (BENADRYL) injection 12.5 mg (12.5 mg Intravenous Given 11/25/21 0429)  acetaminophen (TYLENOL) tablet 1,000 mg (1,000 mg Oral Given 11/25/21 0428)  iohexol (OMNIPAQUE) 350 MG/ML  injection 75 mL (75 mLs Intravenous Contrast Given 11/25/21 0549)  sodium chloride 0.9 % bolus 500 mL (0 mLs Intravenous Stopped 11/25/21 0654)  insulin aspart (novoLOG) injection 7 Units (7 Units Subcutaneous Given 11/25/21 2426)     ED Discharge Orders          Ordered    doxycycline (VIBRAMYCIN) 100 MG capsule  2 times daily        11/25/21 0700    ibuprofen (ADVIL) 600 MG tablet  Every 8 hours PRN        11/25/21 0700             Note:  This document was prepared using Dragon voice recognition software and may include unintentional dictation errors.   Duffy Bruce, MD 11/25/21 [phone]

## 2021-11-25 NOTE — Discharge Instructions (Addendum)
Take the antibiotic as prescribed  For your headache  Take the Ibuprofen prescribed, ideally with a small amount of food Take over-the-counter Acetaminophen (Tylenol) 1000 mg every 6 hours as needed  Drink plenty of fluids  FOLLOW-UP WITH YOUR DOCTOR IN 1 WEEK. CONTINUE YOUR OTHER MEDICATIONS, PARTICULARLY YOUR BABY ASPIRIN DAILY

## 2022-02-17 IMAGING — MG DIGITAL SCREENING BILAT W/ TOMO W/ CAD
5 series · 6 of 17 positions shown · non-contrast
Comparison: Previous exam(s).

CLINICAL DATA: Screening.

EXAM:
DIGITAL SCREENING BILATERAL MAMMOGRAM WITH TOMO AND CAD

[L CC synth-2D]
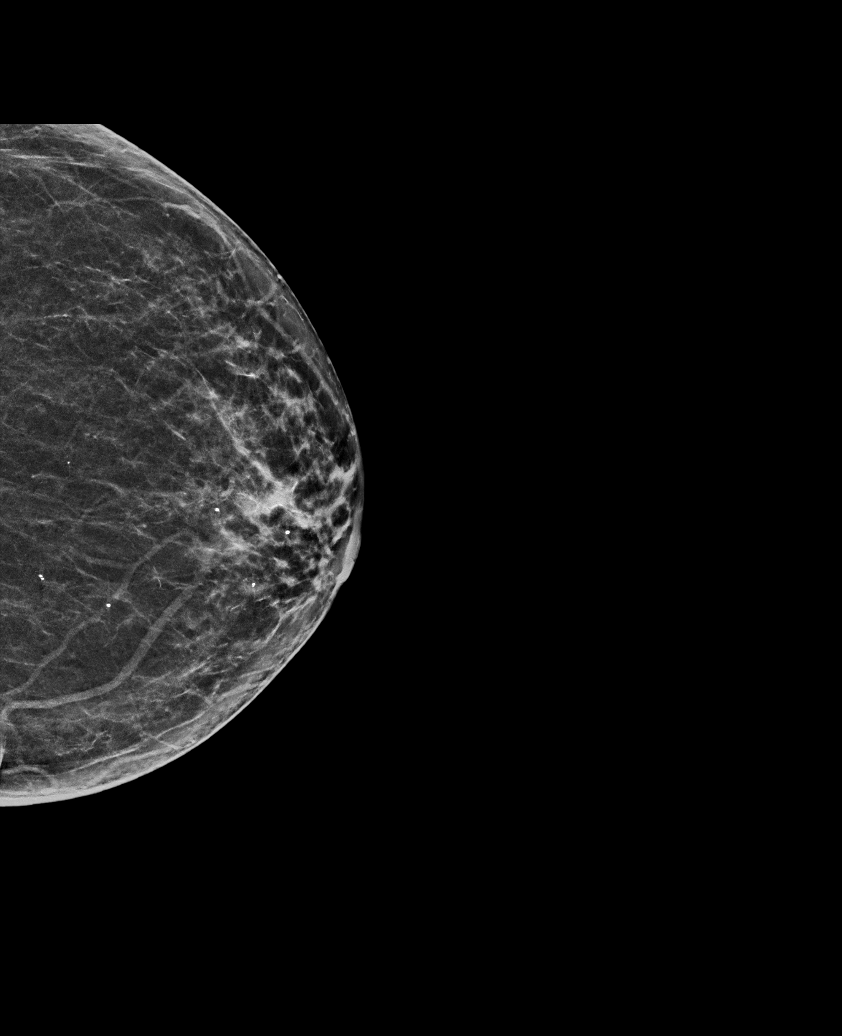

[R MLO synth-2D]
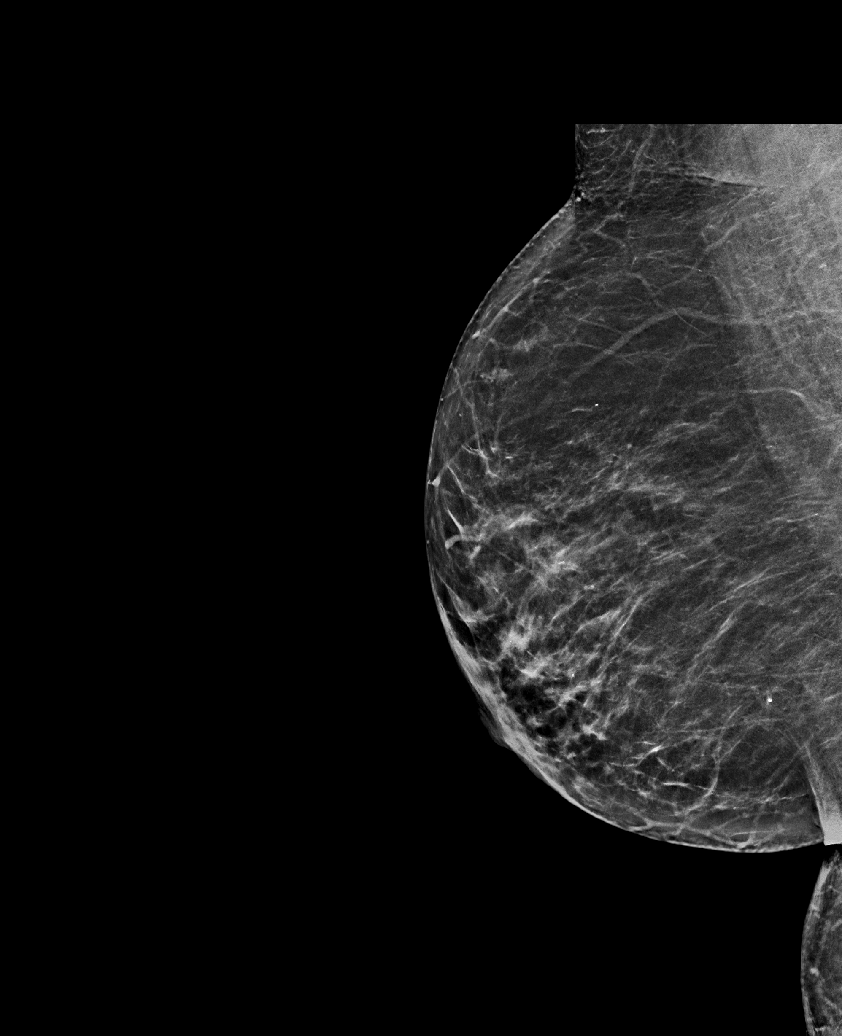

[L MLO tomo · 2 of 72 frames shown]
[frame 24/72]
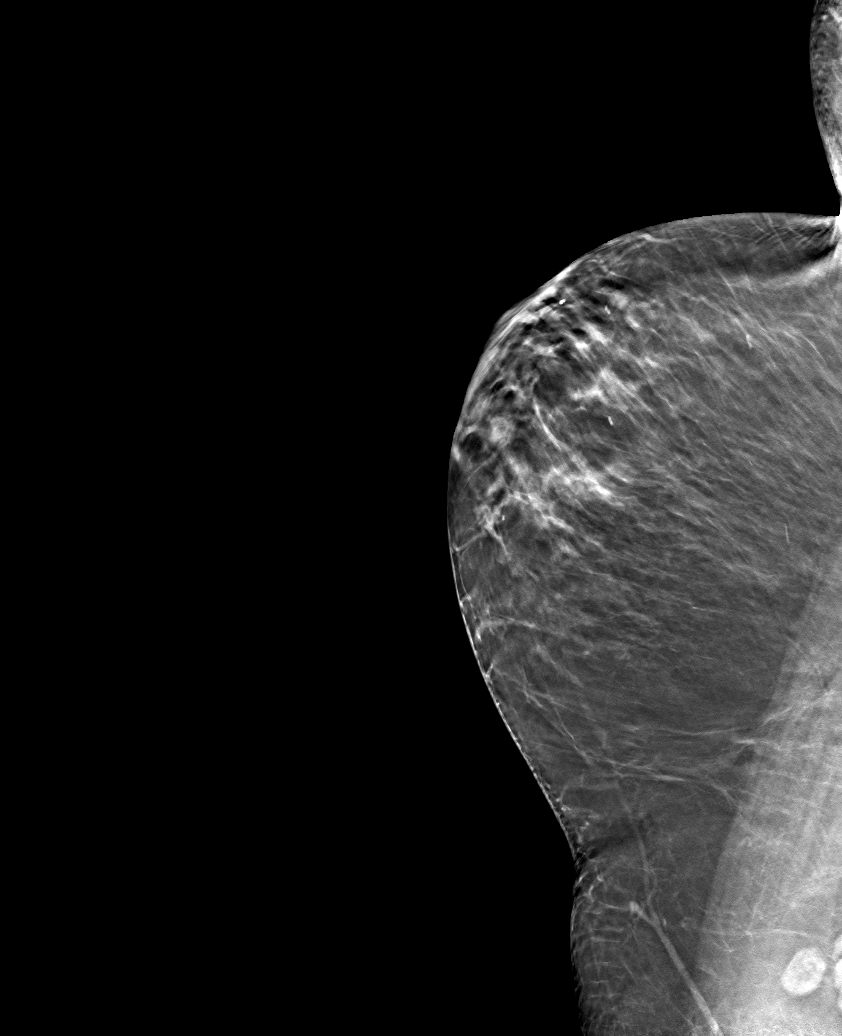
[frame 37/72]
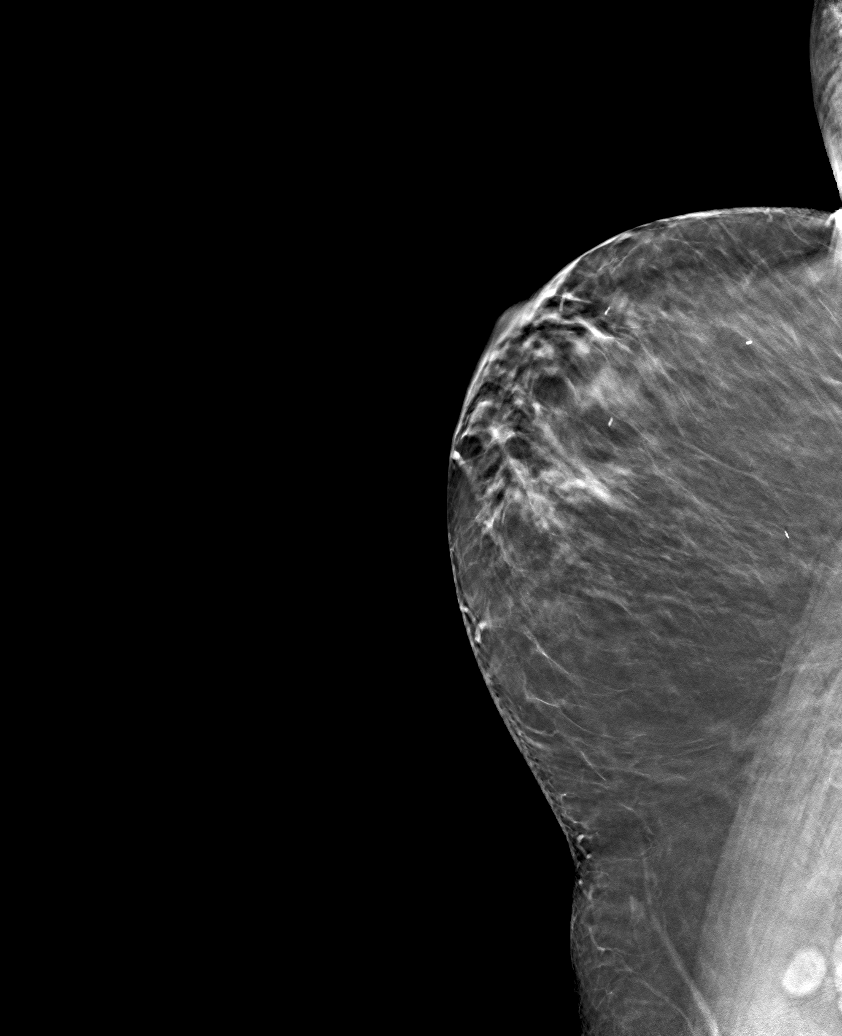

[L CC tomo · tomo slice 33/64.0]
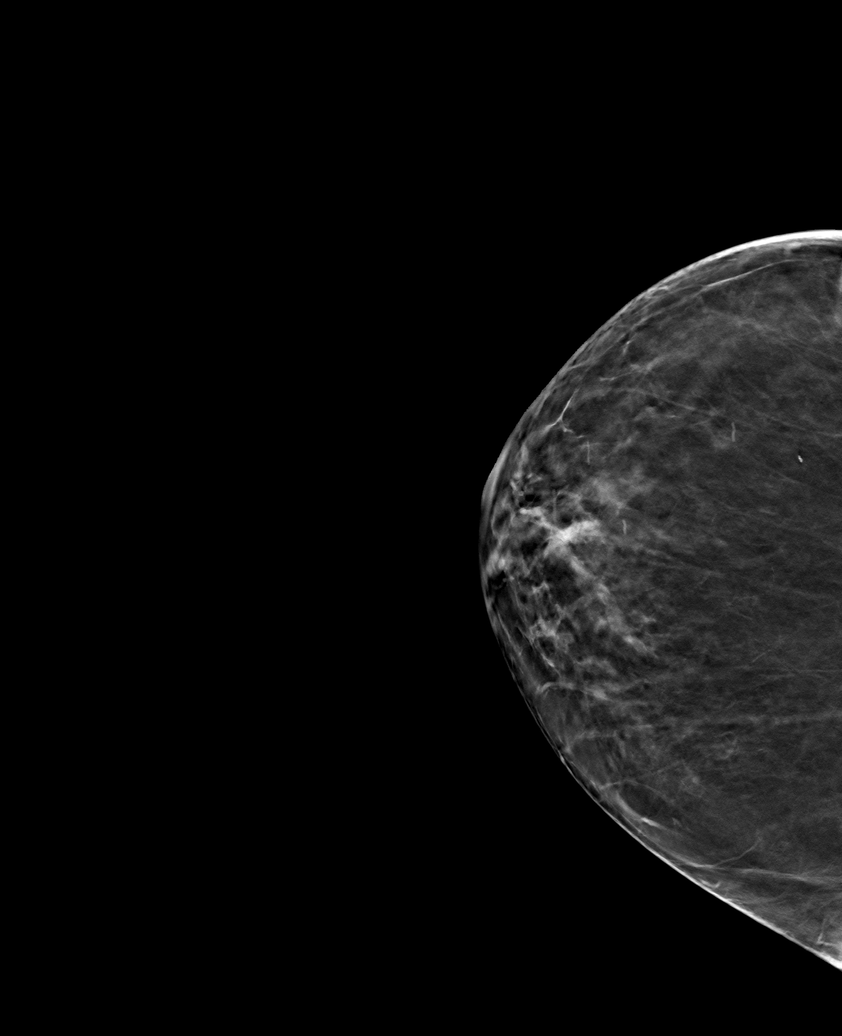

[R CC tomo · tomo slice 31/62.0]
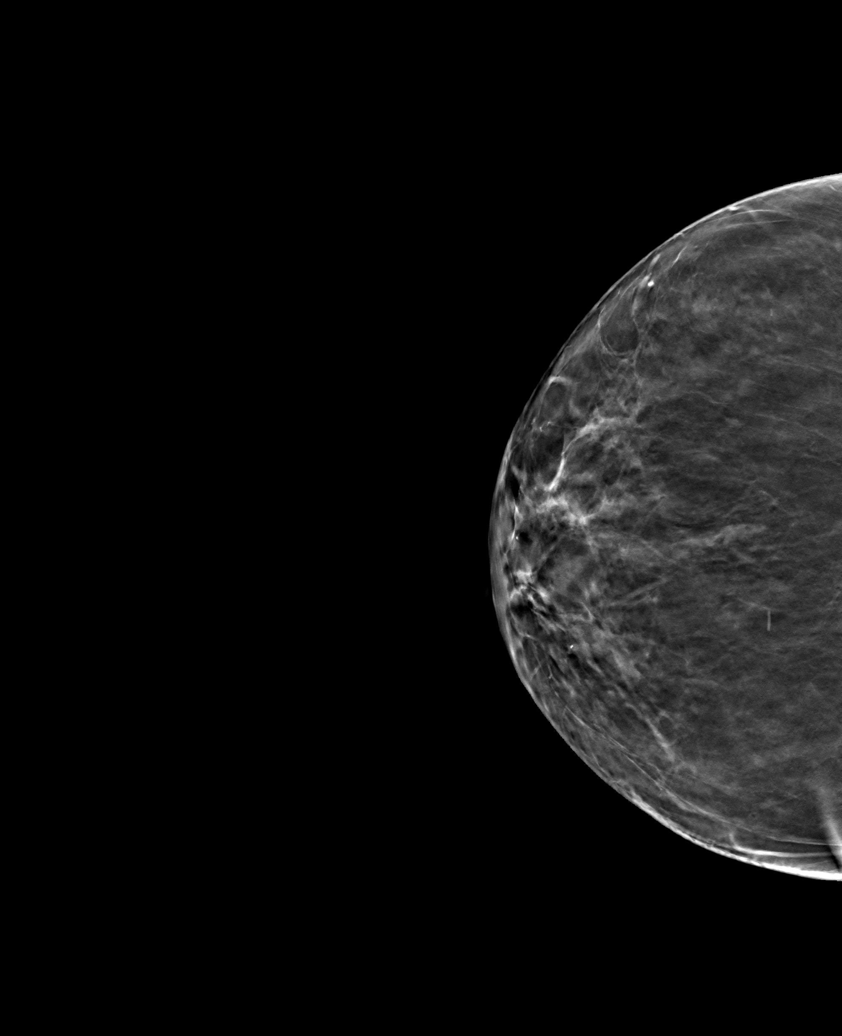

[6 of 17 positions shown; findings below may reference images not displayed]

ACR Breast Density Category b: There are scattered areas of
fibroglandular density.
FINDINGS: There are no findings suspicious for malignancy. Images were
processed with CAD.
IMPRESSION: No mammographic evidence of malignancy. A result letter of this
screening mammogram will be mailed directly to the patient.

RECOMMENDATION:
Screening mammogram in one year. (Code:[id number])

BI-RADS CATEGORY  1: Negative.

## 2022-04-21 ENCOUNTER — Other Ambulatory Visit: Payer: Self-pay

## 2022-04-21 DIAGNOSIS — Z122 Encounter for screening for malignant neoplasm of respiratory organs: Secondary | ICD-10-CM

## 2022-04-21 DIAGNOSIS — Z87891 Personal history of nicotine dependence: Secondary | ICD-10-CM

## 2022-05-01 ENCOUNTER — Ambulatory Visit
Admission: RE | Admit: 2022-05-01 | Discharge: 2022-05-01 | Disposition: A | Discharge status: Home / Self Care | Payer: Medicare Other | Source: Ambulatory Visit | Attending: Acute Care | Admitting: Acute Care

## 2022-05-01 DIAGNOSIS — Z122 Encounter for screening for malignant neoplasm of respiratory organs: Secondary | ICD-10-CM | POA: Diagnosis present

## 2022-05-01 DIAGNOSIS — Z87891 Personal history of nicotine dependence: Secondary | ICD-10-CM | POA: Diagnosis not present

## 2022-06-01 ENCOUNTER — Encounter: Payer: Self-pay | Admitting: Cardiovascular Disease

## 2022-06-07 ENCOUNTER — Other Ambulatory Visit: Payer: Self-pay | Admitting: Family Medicine

## 2022-06-07 DIAGNOSIS — Z1231 Encounter for screening mammogram for malignant neoplasm of breast: Secondary | ICD-10-CM

## 2022-06-14 ENCOUNTER — Other Ambulatory Visit: Payer: Self-pay | Admitting: Family Medicine

## 2022-06-14 DIAGNOSIS — Z780 Asymptomatic menopausal state: Secondary | ICD-10-CM

## 2022-07-17 ENCOUNTER — Ambulatory Visit
Admission: RE | Admit: 2022-07-17 | Discharge: 2022-07-17 | Disposition: A | Discharge status: Home / Self Care | Payer: Medicare Other | Source: Ambulatory Visit | Attending: Family Medicine | Admitting: Family Medicine

## 2022-07-17 DIAGNOSIS — Z1231 Encounter for screening mammogram for malignant neoplasm of breast: Secondary | ICD-10-CM | POA: Diagnosis not present

## 2022-09-18 DIAGNOSIS — R569 Unspecified convulsions: Secondary | ICD-10-CM | POA: Insufficient documentation

## 2022-09-28 ENCOUNTER — Ambulatory Visit
Admission: RE | Admit: 2022-09-28 | Discharge: 2022-09-28 | Disposition: A | Discharge status: Home / Self Care | Payer: Medicare Other | Source: Ambulatory Visit | Attending: Family Medicine | Admitting: Family Medicine

## 2022-09-28 DIAGNOSIS — Z780 Asymptomatic menopausal state: Secondary | ICD-10-CM | POA: Diagnosis present

## 2023-02-12 ENCOUNTER — Other Ambulatory Visit: Payer: Self-pay

## 2023-02-12 ENCOUNTER — Emergency Department
Admission: EM | Admit: 2023-02-12 | Discharge: 2023-02-12 | Disposition: A | Discharge status: Home / Self Care | Payer: Medicare Other | Attending: Emergency Medicine | Admitting: Emergency Medicine

## 2023-02-12 ENCOUNTER — Emergency Department: Payer: Medicare Other

## 2023-02-12 DIAGNOSIS — Z1152 Encounter for screening for COVID-19: Secondary | ICD-10-CM | POA: Diagnosis not present

## 2023-02-12 DIAGNOSIS — Z8673 Personal history of transient ischemic attack (TIA), and cerebral infarction without residual deficits: Secondary | ICD-10-CM | POA: Insufficient documentation

## 2023-02-12 DIAGNOSIS — I10 Essential (primary) hypertension: Secondary | ICD-10-CM | POA: Diagnosis not present

## 2023-02-12 DIAGNOSIS — R0602 Shortness of breath: Secondary | ICD-10-CM

## 2023-02-12 DIAGNOSIS — E119 Type 2 diabetes mellitus without complications: Secondary | ICD-10-CM | POA: Insufficient documentation

## 2023-02-12 LAB — COMPREHENSIVE METABOLIC PANEL
ALT: 9 U/L (ref 0–44)
AST: 13 U/L — ABNORMAL LOW (ref 15–41)
Albumin: 3.5 g/dL (ref 3.5–5.0)
Alkaline Phosphatase: 62 U/L (ref 38–126)
Anion gap: 9 (ref 5–15)
BUN: 22 mg/dL (ref 8–23)
CO2: 28 mmol/L (ref 22–32)
Calcium: 9.1 mg/dL (ref 8.9–10.3)
Chloride: 99 mmol/L (ref 98–111)
Creatinine, Ser: 0.87 mg/dL (ref 0.44–1.00)
GFR, Estimated: >60 mL/min (ref >60)
Glucose, Bld: 276 mg/dL — ABNORMAL HIGH (ref 70–99)
Potassium: 3.6 mmol/L (ref 3.5–5.1)
Sodium: 136 mmol/L (ref 135–145)
Total Bilirubin: 0.5 mg/dL (ref 0.3–1.2)
Total Protein: 6.9 g/dL (ref 6.5–8.1)

## 2023-02-12 LAB — BRAIN NATRIURETIC PEPTIDE: B Natriuretic Peptide: 22.8 pg/mL (ref 0.0–100.0)

## 2023-02-12 LAB — CBC
HCT: 40.2 % (ref 36.0–46.0)
Hemoglobin: 12.8 g/dL (ref 12.0–15.0)
MCH: 26.2 pg (ref 26.0–34.0)
MCHC: 31.8 g/dL (ref 30.0–36.0)
MCV: 82.2 fL (ref 80.0–100.0)
Platelets: 276 10*3/uL (ref 150–400)
RBC: 4.89 MIL/uL (ref 3.87–5.11)
RDW: 15 % (ref 11.5–15.5)
WBC: 5.2 10*3/uL (ref 4.0–10.5)
nRBC: 0 % (ref 0.0–0.2)

## 2023-02-12 LAB — RESP PANEL BY RT-PCR (RSV, FLU A&B, COVID)  RVPGX2
Influenza A by PCR: NEGATIVE
Influenza B by PCR: NEGATIVE
Resp Syncytial Virus by PCR: NEGATIVE
SARS Coronavirus 2 by RT PCR: NEGATIVE

## 2023-02-12 LAB — TROPONIN I (HIGH SENSITIVITY): Troponin I (High Sensitivity): 26 ng/L — ABNORMAL HIGH (ref <18)

## 2023-02-12 NOTE — Discharge Instructions (Signed)
Your workup in the Emergency Department today was reassuring.  We did not find any specific abnormalities.  We recommend you drink plenty of fluids, take your regular medications and/or any new ones prescribed today, and follow up with the doctor(s) listed in these documents as recommended.  Because your primary care doctor wanted you to see a cardiologist but you are unable to set that up, we also provided a referral to the local cardiology service.  Return to the Emergency Department if you develop new or worsening symptoms that concern you.

## 2023-02-12 NOTE — ED Notes (Signed)
Pt verbalized understanding of discharge paperwork and follow-up care. Pt called friend to pick her up.

## 2023-02-12 NOTE — ED Triage Notes (Signed)
Pt presents to ER with c/o sob and some "fluttering in my chest."  Pt states her symptoms started in the middle of last week.  Pt states she has had some recent weight gain, and some cramping in her legs.  Pt denies hx of CHF, COPD or other resp issues.  Pt is A&O x4 and in NAD at this time.  Pt ambulatory to triage.

## 2023-02-12 NOTE — ED Notes (Signed)
ED Provider at bedside. 

## 2023-02-12 NOTE — ED Provider Notes (Signed)
[facility] Provider Note    Event Date/Time   First MD Initiated Contact with Patient 02/12/23 [phone]     (approximate)   History   Shortness of Breath   HPI  Brittany Sherman is a 77 y.o. 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  The patient presents for evaluation of intermittent shortness of breath over the last few weeks.  She said that it can occur at any time.  She feels like she is not getting enough air.  It is not associated with any pain or wheezing.  She denies fever, sore throat, chest pain, nausea, vomiting, abdominal pain, and dysuria.  She had a regular appointment with her primary care provider about 2 weeks ago, and she mentioned it to the provider who suggested she follow-up with cardiology.  She gave the patient a number to call, but the patient left and admitted that she lost the number so she was not able to follow-up.  The patient has no history of lung disease of which she is aware and has not ever smoked.  She has not been on any long trips immediately prior to the onset of the symptoms although she subsequently had a trip to [place] while she was feeling the intermittent shortness of breath.  She has had no unilateral leg pain or swelling, but she mentions that she has had cramps and both her arms and her legs (bilateral) for the last few months.     Physical Exam   Triage Vital Signs: ED Triage Vitals  Enc Vitals Group     BP 02/12/23 0507 (!) 164/74     Pulse Rate 02/12/23 0507 71     Resp 02/12/23 0507 18     Temp 02/12/23 0507 97.8 F (36.6 C)     Temp Source 02/12/23 0507 Oral     SpO2 02/12/23 0507 97 %     Weight 02/12/23 0509 83.5 kg (184 lb)     Height 02/12/23 0509 1.651 m ('5\' 5"'$ )     Head Circumference --      Peak Flow --       Pain Score 02/12/23 0509 8     Pain Loc --      Pain Edu? --      Excl. in [place]? --     Most recent vital signs: Vitals:   02/12/23 0507 02/12/23 0711  BP: (!) 164/74 136/73  Pulse: 71 64  Resp: 18 18  Temp: 97.8 F (36.6 C)   SpO2: 97% 98%     General: Awake, no distress.  Generally well-appearing in spite of her age.  Alert, oriented, pleasant and conversant. CV:  Good peripheral perfusion.  Normal heart sounds.  Regular rate and rhythm. Resp:  Normal effort. Speaking easily and comfortably, no accessory muscle usage nor intercostal retractions.  Lungs are clear to auscultation bilaterally.  She has good air movement and no wheezing, rales, nor rhonchi. Abd:  No distention.  Other:  No focal neurological deficits   ED Results / Procedures / Treatments   Labs (all labs ordered are listed, but only abnormal results are displayed) Labs Reviewed  COMPREHENSIVE METABOLIC PANEL - Abnormal; Notable for the following components:      Result Value   Glucose, Bld 276 (*)    AST 13 (*)    All other components within  normal limits  TROPONIN I (HIGH SENSITIVITY) - Abnormal; Notable for the following components:   Troponin I (High Sensitivity) 26 (*)    All other components within normal limits  RESP PANEL BY RT-PCR (RSV, FLU A&B, COVID)  [id number]  CBC  BRAIN NATRIURETIC PEPTIDE     EKG  ED ECG REPORT I, Hinda Kehr, the attending physician, personally viewed and interpreted this ECG.  Date: 02/12/2023 EKG Time: 5:12 AM Rate: 67 Rhythm: Sinus rhythm with first-degree AV block QRS Axis: normal Intervals: PR interval 228 ms, otherwise unremarkable ST/T Wave abnormalities: normal Narrative Interpretation: no evidence of acute ischemia    RADIOLOGY I viewed and interpreted the patient's 1 view chest x-ray.  There is some streakiness throughout but no obvious sign of pneumonia.  The radiologist commented that there is increased opacity that could be consistent with worsening  interstitial lung disease compared to another x-ray from about 18 years ago, but it is unclear and it could be a viral infectious pattern.    PROCEDURES:  Critical Care performed: No  .1-3 Lead EKG Interpretation  Performed by: Hinda Kehr, MD Authorized by: Hinda Kehr, MD     Interpretation: normal     ECG rate:  62   ECG rate assessment: normal     Rhythm: sinus rhythm     Ectopy: none     Conduction: normal      MEDICATIONS ORDERED IN ED: Medications - No data to display   IMPRESSION / MDM / [place] / ED COURSE  I reviewed the triage vital signs and the nursing notes.                              Differential diagnosis includes, but is not limited to, chronic lung disease (interstitial, sarcoidosis, bronchiectasis, etc.), bacterial community-acquired pneumonia, viral respiratory infection, PE, ACS.  Patient's presentation is most consistent with acute presentation with potential threat to life or bodily function.  Labs/studies ordered: Respiratory viral panel, high-sensitivity troponin, CBC, CMP, 1 view chest x-ray, EKG   Patient is pleasant, awake, alert, and in no distress.  Vital signs are stable and within normal limits including no tachycardia nor hypoxia.  Reassuring EKG and lab work.  Her high-sensitivity troponin is very slightly elevated but she is having no chest pain and no ischemia on her EKG.  The symptoms been going on for weeks and a repeat would be of minimual benefit.  The patient was on the cardiac monitor to evaluate for evidence of arrhythmia and/or significant heart rate changes.  I spoke with the patient about the plan and she agrees that she does not need or want to stay in the hospital.  Given no evidence of acute or emergent condition, though I considered hospitalization based on her age and symptoms, I told her I thought I could best facilitate her outpatient care by referring her to cardiology in [facility] since she was unable  to follow-up as an outpatient with the cardiologist to whom she was referred by her PCP.  Of note, as documented above, her chest x-ray showed some nonspecific changes that the radiologist interpreted as possible worsening chronic interstitial lung disease dating back to at least 2006.  The patient is unaware of any such diagnosis.  It is also possible that she may have a viral respiratory infection which is causing both the symptoms and the nonspecific imaging findings.  Regardless, she does not appear that she would  benefit from antibiotics and she is appropriate for discharge and outpatient follow-up.  I gave strict return precautions.      FINAL CLINICAL IMPRESSION(S) / ED DIAGNOSES   Final diagnoses:  Shortness of breath     Rx / DC Orders   ED Discharge Orders          Ordered    Ambulatory referral to Cardiology       Comments: If you have not heard from the Cardiology office within the next 72 hours please call [phone].   02/12/23 EL:[id number]             Note:  This document was prepared using Dragon voice recognition software and may include unintentional dictation errors.   Hinda Kehr, MD 02/12/23 [phone]

## 2023-04-01 ENCOUNTER — Emergency Department: Payer: Medicare Other

## 2023-04-01 ENCOUNTER — Other Ambulatory Visit: Payer: Self-pay

## 2023-04-01 ENCOUNTER — Emergency Department
Admission: EM | Admit: 2023-04-01 | Discharge: 2023-04-01 | Disposition: A | Discharge status: Home / Self Care | Payer: Medicare Other | Attending: Emergency Medicine | Admitting: Emergency Medicine

## 2023-04-01 DIAGNOSIS — I10 Essential (primary) hypertension: Secondary | ICD-10-CM | POA: Insufficient documentation

## 2023-04-01 DIAGNOSIS — M7989 Other specified soft tissue disorders: Secondary | ICD-10-CM | POA: Diagnosis present

## 2023-04-01 DIAGNOSIS — E119 Type 2 diabetes mellitus without complications: Secondary | ICD-10-CM | POA: Insufficient documentation

## 2023-04-01 DIAGNOSIS — M7121 Synovial cyst of popliteal space [Baker], right knee: Secondary | ICD-10-CM | POA: Diagnosis not present

## 2023-04-01 LAB — CBC WITH DIFFERENTIAL/PLATELET
Abs Immature Granulocytes: 0.03 10*3/uL (ref 0.00–0.07)
Basophils Absolute: 0 10*3/uL (ref 0.0–0.1)
Basophils Relative: 1 %
Eosinophils Absolute: 0 10*3/uL (ref 0.0–0.5)
Eosinophils Relative: 1 %
HCT: 39.2 % (ref 36.0–46.0)
Hemoglobin: 12.3 g/dL (ref 12.0–15.0)
Immature Granulocytes: 1 %
Lymphocytes Relative: 33 %
Lymphs Abs: 2.2 10*3/uL (ref 0.7–4.0)
MCH: 25.8 pg — ABNORMAL LOW (ref 26.0–34.0)
MCHC: 31.4 g/dL (ref 30.0–36.0)
MCV: 82.4 fL (ref 80.0–100.0)
Monocytes Absolute: 0.6 10*3/uL (ref 0.1–1.0)
Monocytes Relative: 9 %
Neutro Abs: 3.7 10*3/uL (ref 1.7–7.7)
Neutrophils Relative %: 55 %
Platelets: 291 10*3/uL (ref 150–400)
RBC: 4.76 MIL/uL (ref 3.87–5.11)
RDW: 15.5 % (ref 11.5–15.5)
WBC: 6.5 10*3/uL (ref 4.0–10.5)
nRBC: 0 % (ref 0.0–0.2)

## 2023-04-01 LAB — COMPREHENSIVE METABOLIC PANEL
ALT: 8 U/L (ref 0–44)
AST: 14 U/L — ABNORMAL LOW (ref 15–41)
Albumin: 3.4 g/dL — ABNORMAL LOW (ref 3.5–5.0)
Alkaline Phosphatase: 52 U/L (ref 38–126)
Anion gap: 8 (ref 5–15)
BUN: 29 mg/dL — ABNORMAL HIGH (ref 8–23)
CO2: 27 mmol/L (ref 22–32)
Calcium: 8.9 mg/dL (ref 8.9–10.3)
Chloride: 98 mmol/L (ref 98–111)
Creatinine, Ser: 1.13 mg/dL — ABNORMAL HIGH (ref 0.44–1.00)
GFR, Estimated: 50 mL/min — ABNORMAL LOW (ref >60)
Glucose, Bld: 370 mg/dL — ABNORMAL HIGH (ref 70–99)
Potassium: 4 mmol/L (ref 3.5–5.1)
Sodium: 133 mmol/L — ABNORMAL LOW (ref 135–145)
Total Bilirubin: 0.5 mg/dL (ref 0.3–1.2)
Total Protein: 6.8 g/dL (ref 6.5–8.1)

## 2023-04-01 NOTE — ED Provider Notes (Signed)
[facility] Provider Note  Patient Contact: 6:14 PM (approximate)   History   Joint Swelling and Ankle Pain   HPI  Brittany Sherman is a 77 y.o. female with a history of hypertension, diabetes and seizures, presents to the emergency department with right-sided calf swelling.  Patient reports that swelling does extend into the right ankle.  She has never had similar symptoms of this in the past.  She states that she had a cataract surgery in March.  No daily smoking or recent travel.  No pleuritic chest pain or shortness of breath.  No cough.      Physical Exam   Triage Vital Signs: ED Triage Vitals  Enc Vitals Group     BP 04/01/23 1809 (!) 182/81     Pulse Rate 04/01/23 1809 81     Resp 04/01/23 1809 18     Temp 04/01/23 1809 98.8 F (37.1 C)     Temp Source 04/01/23 1809 Oral     SpO2 04/01/23 1809 94 %     Weight 04/01/23 1803 182 lb 15.7 oz (83 kg)     Height 04/01/23 1803 5\' 5"  (1.651 m)     Head Circumference --      Peak Flow --      Pain Score 04/01/23 1803 2     Pain Loc --      Pain Edu? --      Excl. in [place]? --     Most recent vital signs: Vitals:   04/01/23 1809 04/01/23 2038  BP: (!) 182/81 (!) 168/74  Pulse: 81 78  Resp: 18 17  Temp: 98.8 F (37.1 C) 98.4 F (36.9 C)  SpO2: 94% 99%     General: Alert and in no acute distress. Eyes:  PERRL. EOMI. Head: No acute traumatic findings ENT:      Nose: No congestion/rhinnorhea.      Mouth/Throat: Mucous membranes are moist. Neck: No stridor. No cervical spine tenderness to palpation. Cardiovascular:  Good peripheral perfusion Respiratory: Normal respiratory effort without tachypnea or retractions. Lungs CTAB. Good air entry to the bases with no decreased or absent breath sounds. Gastrointestinal: Bowel sounds 4 quadrants. Soft and nontender to palpation. No guarding or rigidity. No palpable masses. No distention. No CVA tenderness. Musculoskeletal: Patient has right-sided  calf pain.  Palpable radial and ulnar pulses bilaterally and symmetrically.  Capillary refill less than 2 seconds on the right. Neurologic:  No gross focal neurologic deficits are appreciated.  Skin: 2+ pitting edema on the right.  Other:   ED Results / Procedures / Treatments   Labs (all labs ordered are listed, but only abnormal results are displayed) Labs Reviewed  CBC WITH DIFFERENTIAL/PLATELET - Abnormal; Notable for the following components:      Result Value   MCH 25.8 (*)    All other components within normal limits  COMPREHENSIVE METABOLIC PANEL - Abnormal; Notable for the following components:   Sodium 133 (*)    Glucose, Bld 370 (*)    BUN 29 (*)    Creatinine, Ser 1.13 (*)    Albumin 3.4 (*)    AST 14 (*)    GFR, Estimated 50 (*)    All other components within normal limits        RADIOLOGY  I personally viewed and evaluated these images as part of my medical decision making, as well as reviewing the written report by the radiologist.  ED Provider Interpretation: Venous ultrasound of the right  lower extremity indicates Baker's cyst but no other acute abnormality.   PROCEDURES:  Critical Care performed: No  Procedures   MEDICATIONS ORDERED IN ED: Medications - No data to display   IMPRESSION / MDM / ASSESSMENT AND PLAN / ED COURSE  I reviewed the triage vital signs and the nursing notes.                              Assessment and plan: Leg swelling:  77 year old female presents to the emergency department with right calf pain and ankle swelling.  Patient was hypertensive at triage but vital signs otherwise reassuring.  On exam, patient was alert, active and nontoxic-appearing.  Venous ultrasound indicated a Baker's cyst but was otherwise unremarkable for DVT.  CBC and CMP largely consistent with baseline labs.  All patient questions were answered.     FINAL CLINICAL IMPRESSION(S) / ED DIAGNOSES   Final diagnoses:  Synovial cyst of right  popliteal space     Rx / DC Orders   ED Discharge Orders     None        Note:  This document was prepared using Dragon voice recognition software and may include unintentional dictation errors.   Gasper Lloyd 04/01/23 2157    Jene Every, MD 04/02/23 1500

## 2023-04-23 NOTE — Progress Notes (Deleted)
NO SHOW

## 2023-04-24 ENCOUNTER — Ambulatory Visit: Payer: Medicare Other | Attending: Cardiovascular Disease | Admitting: Cardiovascular Disease

## 2023-04-24 DIAGNOSIS — E782 Mixed hyperlipidemia: Secondary | ICD-10-CM

## 2023-04-24 DIAGNOSIS — I10 Essential (primary) hypertension: Secondary | ICD-10-CM

## 2023-04-24 DIAGNOSIS — E118 Type 2 diabetes mellitus with unspecified complications: Secondary | ICD-10-CM

## 2023-04-24 DIAGNOSIS — I700 Atherosclerosis of aorta: Secondary | ICD-10-CM

## 2023-04-24 DIAGNOSIS — E1169 Type 2 diabetes mellitus with other specified complication: Secondary | ICD-10-CM

## 2023-04-25 ENCOUNTER — Encounter: Payer: Self-pay | Admitting: Cardiovascular Disease

## 2023-05-07 ENCOUNTER — Telehealth: Payer: Self-pay | Admitting: Gastroenterology

## 2023-05-07 NOTE — Telephone Encounter (Signed)
Pt received letter to schedule colonoscopy please return call

## 2023-05-07 NOTE — Telephone Encounter (Signed)
Tried to call patient but could not leave message, VM is not set up 

## 2023-05-09 ENCOUNTER — Telehealth: Payer: Self-pay | Admitting: Gastroenterology

## 2023-05-09 NOTE — Telephone Encounter (Signed)
Pt caleed left vm message to schedule colonoscopy please return call

## 2023-05-09 NOTE — Telephone Encounter (Signed)
This is a [name] patient not Dr Servando Snare. There is a telephone encounter for patient on 05/07/2023 already as well.  Could not leave message since VM is not set up.

## 2023-05-10 NOTE — Telephone Encounter (Signed)
Tried to call patient but could not have leave message since VM is not set up.

## 2023-05-15 NOTE — Telephone Encounter (Signed)
Tried to call patient but could not leave message since voicemail is full 

## 2023-05-30 ENCOUNTER — Telehealth: Payer: Self-pay

## 2023-05-30 ENCOUNTER — Other Ambulatory Visit: Payer: Self-pay

## 2023-05-30 ENCOUNTER — Telehealth: Payer: Self-pay | Admitting: *Deleted

## 2023-05-30 DIAGNOSIS — Z86010 Personal history of colonic polyps: Secondary | ICD-10-CM

## 2023-05-30 MED ORDER — NA SULFATE-K SULFATE-MG SULF 17.5-3.13-1.6 GM/177ML PO SOLN: 1.0000 | Freq: Once | ORAL | 0 refills | Status: AC

## 2023-05-30 NOTE — Telephone Encounter (Signed)
Gastroenterology Pre-Procedure Review  Request Date: 08/06/23 Requesting Physician: Dr. Tobi Bastos  PATIENT REVIEW QUESTIONS: The patient responded to the following health history questions as indicated:    1. Are you having any GI issues? no 2. Do you have a personal history of Polyps? yes (last colonoscopy performed by Dr Tobi Bastos 08/03/2018) 3. Do you have a family history of Colon Cancer or Polyps? no 4. Diabetes Mellitus? yes (stop dates advised for Ozempic, and Metformin.  Pt advised to take half the usual dose of insulin the day before colonoscopy) 5. Joint replacements in the past 12 months?no 6. Major health problems in the past 3 months?no 7. Any artificial heart valves, MVP, or defibrillator? Pt does have cardiac history.  Clearance sent to Pre-op team via staff message    MEDICATIONS & ALLERGIES:    Patient reports the following regarding taking any anticoagulation/antiplatelet therapy:   Plavix, Coumadin, Eliquis, Xarelto, Lovenox, Pradaxa, Brilinta, or Effient? no Aspirin? yes (81 mg daily)  Patient confirms/reports the following medications:  Current Outpatient Medications  Medication Sig Dispense Refill   amLODipine (NORVASC) 5 MG tablet Take 1 tablet (5 mg total) by mouth daily. For further refills needs to see provider. 30 tablet 0   ASPIRIN LOW DOSE 81 MG tablet Take 81 mg by mouth daily.     atorvastatin (LIPITOR) 40 MG tablet TAKE 1 TABLET BY MOUTH ONCE DAILY IN THE MORNING (Patient not taking: Reported on 04/01/2023) 90 tablet 0   brimonidine (ALPHAGAN) 0.2 % ophthalmic solution 1 drop 2 (two) times daily.     divalproex (DEPAKOTE) 250 MG DR tablet Take 250 mg by mouth 2 (two) times daily.     empagliflozin (JARDIANCE) 25 MG TABS tablet Take 25 mg by mouth daily. (Patient not taking: Reported on 07/15/2021) 90 tablet 1   gatifloxacin (ZYMAXID) 0.5 % SOLN Place 1 drop into the left eye 4 (four) times daily.     glimepiride (AMARYL) 4 MG tablet Take 1 tablet by mouth twice daily  (Patient not taking: Reported on 04/01/2023) 180 tablet 0   hydrOXYzine (ATARAX/VISTARIL) 25 MG tablet Take 1 tablet (25 mg total) by mouth every 6 (six) hours as needed for itching. (Patient not taking: Reported on 06/29/2020) 30 tablet 0   ibuprofen (ADVIL) 600 MG tablet Take 1 tablet (600 mg total) by mouth every 8 (eight) hours as needed for moderate pain or headache. (Patient not taking: Reported on 04/01/2023) 20 tablet 0   insulin degludec (TRESIBA FLEXTOUCH) 100 UNIT/ML SOPN FlexTouch Pen Inject 0.3 mLs (30 Units total) into the skin daily at 10 pm. (Patient not taking: Reported on 04/01/2023) 3 mL 3   Insulin Pen Needle (PEN NEEDLES) 32G X 6 MM MISC 1 each by Does not apply route daily. 100 each 12   ketorolac (ACULAR) 0.5 % ophthalmic solution Place 1 drop into the left eye 4 (four) times daily.     lamoTRIgine (LAMICTAL) 150 MG tablet Take 150 mg by mouth daily.     LANTUS SOLOSTAR 100 UNIT/ML Solostar Pen Inject 44 Units into the skin at bedtime.     lisinopril-hydrochlorothiazide (ZESTORETIC) 20-25 MG tablet Take 1 tablet by mouth daily.     Omega-3 Fatty Acids (FISH OIL) 1000 MG CAPS Take by mouth.     OZEMPIC, 0.25 OR 0.5 MG/DOSE, 2 MG/3ML SOPN Inject 0.5 mg into the skin once a week.     prednisoLONE acetate (PRED FORTE) 1 % ophthalmic suspension Place 1 drop into the left eye 4 (four) times  daily.     predniSONE (DELTASONE) 50 MG tablet Take 1 tablet (50 mg total) by mouth daily with breakfast. (Patient not taking: Reported on 06/29/2020) 5 tablet 0   quinapril-hydrochlorothiazide (ACCURETIC) 20-25 MG tablet TAKE 1 TABLET BY MOUTH ONCE DAILY (Patient not taking: Reported on 04/01/2023) 90 tablet 0   Vitamin D, Ergocalciferol, (DRISDOL) 50000 units CAPS capsule Take 1 capsule (50,000 Units total) by mouth every 7 (seven) days. (Patient not taking: Reported on 04/01/2023) 12 capsule 3   No current facility-administered medications for this visit.    Patient confirms/reports the following  allergies:  Allergies  Allergen Reactions   Aspirin     Stroke   Metformin And Related Diarrhea   Penicillins Rash    Has patient had a PCN reaction causing immediate rash, facial/tongue/throat swelling, SOB or lightheadedness with hypotension: Yes Has patient had a PCN reaction causing severe rash involving mucus membranes or skin necrosis: No Has patient had a PCN reaction that required hospitalization No Has patient had a PCN reaction occurring within the last 10 years: Yes If all of the above answers are "NO", then may proceed with Cephalosporin use.    No orders of the defined types were placed in this encounter.   AUTHORIZATION INFORMATION Primary Insurance: 1D#: Group #:  Secondary Insurance: 1D#: Group #:  SCHEDULE INFORMATION: Date: 08/06/23 Time: Location: [facility]

## 2023-05-30 NOTE — Telephone Encounter (Signed)
-----   Message from Levi Aland, NP sent at 05/30/2023  3:51 PM EDT ----- Regarding: FW: Colonoscopy 08/06/23 [facility] Dr. Tobi Bastos Clearance Requested  ----- Message ----- From: Avie Arenas, CMA Sent: 05/30/2023   3:46 PM EDT To: Avie Arenas, CMA; Cv Div Preop Subject: Colonoscopy 08/06/23 [facility] Dr. Tobi Bastos Clearance#     [facility] Pre-operative Risk Assessment   Request for surgical clearance: YES   What type of surgery is being performed? Colonoscopy   When is this surgery scheduled? 08/06/23   Are there any medications that need to be held prior to surgery and how long?NO   Practice name and name of physician performing surgery? [facility]   What is your office phone and fax number? [phone]   Anesthesia type (None, local, MAC, general) ? General   Avie Arenas 05/30/2023, 3:45 PM  _________________________________________________________________   (provider comments below)

## 2023-05-30 NOTE — Telephone Encounter (Signed)
   Pre-operative Risk Assessment    Patient Name: Brittany Sherman  DOB: [birth year] MRN: [id number]      Request for Surgical Clearance    Procedure:   COLONOSCOPY  Date of Surgery:  Clearance 08/06/23                                 Surgeon:  DR. Sharlet Salina ANNA Surgeon's Group or Practice Name:  [facility] Phone number:  [phone] Fax number:  [phone]   Type of Clearance Requested:   - Medical ; NO MEDICATIONS LISTED AS NEEDING TO BE HELD   Type of Anesthesia:  General    Additional requests/questions:    Elpidio Anis   05/30/2023, 3:53 PM

## 2023-05-30 NOTE — Telephone Encounter (Signed)
Pt left message to schedule colonoscopy please return call  

## 2023-05-31 NOTE — Telephone Encounter (Signed)
Primary Cardiologist:None  Chart reviewed as part of pre-operative protocol coverage. Because of Brittany Sherman's past medical history and time since last visit, Brittany Sherman will require a follow-up visit in order to better assess preoperative cardiovascular risk.  Pre-op covering staff: - Patient has an appointment on 08/01/23 with Dr. Mariah Milling. This is close to procedure date, however she requires in office visit prior to recommendations being provided.  - Please contact requesting surgeon's office via preferred method (i.e, phone, fax) to inform them of need for appointment prior to surgery.  No request to hold cardiac medications.     Brittany Aland, NP-C  05/31/2023, 9:27 AM [address] Office [phone] Fax [phone]

## 2023-05-31 NOTE — Telephone Encounter (Signed)
Call placed to pt regarding surgical clearance.  No voicemail set up.  If pt calls back, please let the preop team know so we can get the call transferred.

## 2023-06-04 NOTE — Telephone Encounter (Signed)
Pt has appt with Dr. Mariah Milling 08/01/23. Pt has colonoscopy with DR. Tobi Bastos 08/06/23, see notes from pre op APP Eligha Bridegroom, NP, 08/01/23 should be fine. Pt is requiring an in office appt. I will update all parties involved.

## 2023-06-04 NOTE — Telephone Encounter (Signed)
I tried to reach the pt as well, to see if she might want to move her appt up some. VM not set up and could not leave a message to call back. APPT notes state looks like the pt is coming in for shortness of breath, so maybe we can move appt up a bit if the pt wants too. Cannot not be sooner though than 06/06/23 as this will be 2 months from the procedure.

## 2023-06-13 ENCOUNTER — Other Ambulatory Visit: Payer: Self-pay | Admitting: Family Medicine

## 2023-06-13 DIAGNOSIS — Z1231 Encounter for screening mammogram for malignant neoplasm of breast: Secondary | ICD-10-CM

## 2023-07-19 ENCOUNTER — Ambulatory Visit
Admission: RE | Admit: 2023-07-19 | Discharge: 2023-07-19 | Disposition: A | Discharge status: Home / Self Care | Payer: Medicare Other | Source: Ambulatory Visit | Attending: Family Medicine | Admitting: Family Medicine

## 2023-07-19 DIAGNOSIS — Z1231 Encounter for screening mammogram for malignant neoplasm of breast: Secondary | ICD-10-CM | POA: Diagnosis present

## 2023-07-31 NOTE — Progress Notes (Deleted)
Cardiology Office Note  Date:  07/31/2023   ID:  Rylei, Giasson 1946-10-15, MRN [id number]  PCP:  Lorn Junes, FNP   No chief complaint on file.   HPI:  Ms. nykiah rothermel is a 77 year old woman with past medical history of Hypertension Diabetes type 2 with complications, on insulin History of seizures Chronic low back pain, leg pain Smoker, quit 15 yr ago "Hx of CVA" 15 years ago Who presents for routine follow-up of her aortic atherosclerosis on CT scan  Last seen by myself in clinic 07-20-2021  Reports husband died early 2021/08/20, Lives with other family, very sedentary at baseline no regular exercise program Has some fatigue which he attributes to deconditioning from inactivity  Denies any chest pain concerning for angina Try to watch her diet given elevated sugars, continues to struggle, reports that she takes insulin once a week  CT scan chest reviewed Mild diffuse aortic atherosclerosis No significant coronary calcification Images pulled up and reviewed with her  Husband died early 08-20-21  Lab work reviewed A1C 12 Normal creatinine No recent lipid panel available, prior LDL 70 in 20-Aug-2018  Carotid reviewed with her LEFT CAROTID ARTERY: Similar minor intimal thickening and trace plaque formation. No hemodynamically significant left ICA stenosis, velocity elevation, or turbulent flow. LEFT VERTEBRAL ARTERY:  Normal antegrade flow IMPRESSION: Minor carotid atherosclerosis. Negative for stenosis. Degree of narrowing less than 50% bilaterally by ultrasound criteria.  EKG personally reviewed by myself on todays visit Normal sinus rhythm rate 68 bpm no significant ST-T wave changes   PMH:   has a past medical history of Arthritis, Benign neoplasm of rectum and anal canal, Diabetes mellitus without complication (HCC), History of vertigo, Hypertension, Immune deficiency disorder (HCC), Infectious colitis, enteritis and gastroenteritis, Partial epilepsy with  impairment of consciousness (HCC), Seizures (HCC), Stroke [facility]), Trigger finger of right thumb, and Vitamin D deficiency.  PSH:    Past Surgical History:  Procedure Laterality Date   ABDOMINAL HYSTERECTOMY     BACK SURGERY     COLONOSCOPY WITH PROPOFOL N/A 07/24/2017   Procedure: COLONOSCOPY WITH PROPOFOL;  Surgeon: Wyline Mood, MD;  Location: [facility] ENDOSCOPY;  Service: Endoscopy;  Laterality: N/A;   COLONOSCOPY WITH PROPOFOL N/A 08/03/2020   Procedure: COLONOSCOPY WITH PROPOFOL;  Surgeon: Wyline Mood, MD;  Location: [facility] ENDOSCOPY;  Service: Gastroenterology;  Laterality: N/A;   OOPHORECTOMY     SPINE SURGERY     TOTAL HIP ARTHROPLASTY  August 20, 2016    Current Outpatient Medications  Medication Sig Dispense Refill   amLODipine (NORVASC) 5 MG tablet Take 1 tablet (5 mg total) by mouth daily. For further refills needs to see provider. 30 tablet 0   ASPIRIN LOW DOSE 81 MG tablet Take 81 mg by mouth daily.     atorvastatin (LIPITOR) 40 MG tablet TAKE 1 TABLET BY MOUTH ONCE DAILY IN THE MORNING (Patient not taking: Reported on 04/01/2023) 90 tablet 0   brimonidine (ALPHAGAN) 0.2 % ophthalmic solution 1 drop 2 (two) times daily.     divalproex (DEPAKOTE) 250 MG DR tablet Take 250 mg by mouth 2 (two) times daily.     empagliflozin (JARDIANCE) 25 MG TABS tablet Take 25 mg by mouth daily. (Patient not taking: Reported on 07/15/2021) 90 tablet 1   gatifloxacin (ZYMAXID) 0.5 % SOLN Place 1 drop into the left eye 4 (four) times daily. (Patient not taking: Reported on 05/30/2023)     glimepiride (AMARYL) 4 MG tablet Take 1 tablet by mouth twice daily  180 tablet 0   hydrOXYzine (ATARAX/VISTARIL) 25 MG tablet Take 1 tablet (25 mg total) by mouth every 6 (six) hours as needed for itching. (Patient not taking: Reported on 06/29/2020) 30 tablet 0   ibuprofen (ADVIL) 600 MG tablet Take 1 tablet (600 mg total) by mouth every 8 (eight) hours as needed for moderate pain or headache. (Patient not taking: Reported on 04/01/2023)  20 tablet 0   insulin degludec (TRESIBA FLEXTOUCH) 100 UNIT/ML SOPN FlexTouch Pen Inject 0.3 mLs (30 Units total) into the skin daily at 10 pm. 3 mL 3   Insulin Pen Needle (PEN NEEDLES) 32G X 6 MM MISC 1 each by Does not apply route daily. 100 each 12   ketorolac (ACULAR) 0.5 % ophthalmic solution Place 1 drop into the left eye 4 (four) times daily.     lamoTRIgine (LAMICTAL) 150 MG tablet Take 150 mg by mouth daily.     LANTUS SOLOSTAR 100 UNIT/ML Solostar Pen Inject 44 Units into the skin at bedtime.     lisinopril-hydrochlorothiazide (ZESTORETIC) 20-25 MG tablet Take 1 tablet by mouth daily.     magnesium oxide (MAG-OX) 400 MG tablet Take 1 tablet by mouth 2 (two) times daily.     Omega-3 Fatty Acids (FISH OIL) 1000 MG CAPS Take by mouth.     OZEMPIC, 0.25 OR 0.5 MG/DOSE, 2 MG/3ML SOPN Inject 0.5 mg into the skin once a week.     prednisoLONE acetate (PRED FORTE) 1 % ophthalmic suspension Place 1 drop into the left eye 4 (four) times daily.     predniSONE (DELTASONE) 50 MG tablet Take 1 tablet (50 mg total) by mouth daily with breakfast. 5 tablet 0   quinapril-hydrochlorothiazide (ACCURETIC) 20-25 MG tablet TAKE 1 TABLET BY MOUTH ONCE DAILY 90 tablet 0   Vitamin D, Ergocalciferol, (DRISDOL) 50000 units CAPS capsule Take 1 capsule (50,000 Units total) by mouth every 7 (seven) days. (Patient not taking: Reported on 05/30/2023) 12 capsule 3   No current facility-administered medications for this visit.    Allergies:   Aspirin, Metformin and related, and Penicillins   Social History:  The patient  reports that she quit smoking about 16 years ago. Her smoking use included cigarettes. She started smoking about 59 years ago. She has a 64.5 pack-year smoking history. She has never used smokeless tobacco. She reports that she does not drink alcohol and does not use drugs.   Family History:   family history includes Aneurysm in her sister; Brain cancer in her mother; Cancer in her sister; Esophageal  cancer in her sister.    Review of Systems: Review of Systems  Constitutional:  Positive for malaise/fatigue.  HENT: Negative.    Respiratory: Negative.    Cardiovascular: Negative.   Gastrointestinal: Negative.   Musculoskeletal: Negative.   Neurological: Negative.   Psychiatric/Behavioral: Negative.    All other systems reviewed and are negative.    PHYSICAL EXAM: VS:  There were no vitals taken for this visit. , BMI There is no height or weight on file to calculate BMI. GEN: Well nourished, well developed, in no acute distress HEENT: normal Neck: no JVD, carotid bruits, or masses Cardiac: RRR; no murmurs, rubs, or gallops,no edema  Respiratory:  clear to auscultation bilaterally, normal work of breathing GI: soft, nontender, nondistended, + BS MS: no deformity or atrophy Skin: warm and dry, no rash Neuro:  Strength and sensation are intact Psych: euthymic mood, full affect   Recent Labs: 02/12/2023: B Natriuretic Peptide 22.8 04/01/2023: ALT  8; BUN 29; Creatinine, Ser 1.13; Hemoglobin 12.3; Platelets 291; Potassium 4.0; Sodium 133    Lipid Panel Lab Results  Component Value Date   CHOL 135 07/18/2018   HDL 44 07/18/2018   LDLCALC 70 07/18/2018   TRIG 106 07/18/2018      Wt Readings from Last 3 Encounters:  04/01/23 182 lb 15.7 oz (83 kg)  02/12/23 184 lb (83.5 kg)  05/01/22 162 lb (73.5 kg)       ASSESSMENT AND PLAN:  Problem List Items Addressed This Visit   None   Aortic atherosclerosis mild diffuse disease, images reviewed with her today, no significant coronary calcification She denies anginal symptoms Medical management recommended No further testing indicated Continue statin, she has already quit smoking 15 years ago Stressed importance of aggressive diabetes control, reports she is trying to watch her diet.  A1c markedly elevated 12 She is interested in additional medication options remaining with endocrinology At her request referral has  been placed [facility] endocrine [facility]  Knee pain left Reports having Baker's cyst, cartilage problems, has chronic knee pain, reports getting worse \\At  her request referral made to orthopedics type 2 diabetes with complications  Diabetes type 2 with complications At her request referral to endocrine, A1c of 12  Hyperlipidemia No recent numbers available, prior lipids at goal  no changes to the medications were made. Goal LDL less than 70   Total encounter time more than 60 minutes  Greater than 50% was spent in counseling and coordination of care with the patient    Signed, Dossie Arbour, M.D., Ph.D. [facility] [place], [place] [phone]

## 2023-08-01 ENCOUNTER — Ambulatory Visit: Payer: Medicare Other | Attending: Cardiovascular Disease | Admitting: Cardiovascular Disease

## 2023-08-01 DIAGNOSIS — E118 Type 2 diabetes mellitus with unspecified complications: Secondary | ICD-10-CM

## 2023-08-01 DIAGNOSIS — E1169 Type 2 diabetes mellitus with other specified complication: Secondary | ICD-10-CM

## 2023-08-01 DIAGNOSIS — I700 Atherosclerosis of aorta: Secondary | ICD-10-CM

## 2023-08-01 DIAGNOSIS — N183 Chronic kidney disease, stage 3 unspecified: Secondary | ICD-10-CM

## 2023-08-01 DIAGNOSIS — I129 Hypertensive chronic kidney disease with stage 1 through stage 4 chronic kidney disease, or unspecified chronic kidney disease: Secondary | ICD-10-CM

## 2023-08-02 ENCOUNTER — Encounter: Payer: Self-pay | Admitting: Cardiovascular Disease

## 2023-08-02 ENCOUNTER — Telehealth: Payer: Self-pay

## 2023-08-02 NOTE — Telephone Encounter (Signed)
Tried to contact patient to let her know that I've canceled her colonoscopy due to her missing her 08/01/23 cardiac preop appt.  No answer. No voicemail.  Will try to reach her again tomorrow.  Thanks,  [place], [place]

## 2023-08-06 ENCOUNTER — Ambulatory Visit: Admission: RE | Admit: 2023-08-06 | Payer: Medicare Other | Source: Home / Self Care | Admitting: Gastroenterology

## 2023-08-06 ENCOUNTER — Encounter: Admission: RE | Payer: Self-pay | Source: Home / Self Care

## 2023-08-06 SURGERY — COLONOSCOPY WITH PROPOFOL
Anesthesia: General

## 2023-08-06 NOTE — Progress Notes (Unsigned)
dragonCardiology Office Note  Date:  08/07/2023   ID:  Brittany Sherman, Brittany Sherman 1946/08/30, MRN [id number]  PCP:  Brittany Junes, FNP   Chief Complaint  Patient presents with   Pre operative clearance    Preoperative clearance for colonoscopy. The patient states that her ankle has been swelling. Patient states that her hands has been feeling hot. Meds reviewed.     HPI:  [name] is a 77 year old woman with past medical history of Hypertension Diabetes type 2 with complications, on insulin History of seizures Chronic low back pain, leg pain Smoker, quit 15 yr ago "Hx of CVA" 15 years ago Who presents for routine follow-up of her aortic atherosclerosis on CT scan  Last seen by myself in clinic July 2022  Scheduled for colonoscopy yesterday, this was canceled  In follow-up today, medication list is not accurate in the [facility] system She did not bring her medication list with her We will call the [place] clinic, waiting to hear back from her pharmacy Reports that she takes pill pack some in the morning some in the evenings but she does not know what the medications are Current list has quinapril HCTZ as well as lisinopril hydrochlorothiazide She does not know which when she is on Did not take several of her medications this morning  Denies shortness of breath on exertion, no chest pain concerning for angina Recent cataract surgery Denies leg swelling, no PND orthopnea  On discussion of her elevated A1c 12.2, reports that she is working with primary care Is not followed by endocrinology  Reports husband died early 2021/08/23, Lives with other family, very sedentary at baseline no regular exercise program  CT scan chest reviewed Mild diffuse aortic atherosclerosis No significant coronary calcification  Lab work reviewed A1C 12.2 Total cholesterol 232, LDL 150 Creatinine 1.13  EKG personally reviewed by myself on todays visit EKG Interpretation Date/Time:  Tuesday August 07 2023 14:31:17 EDT Ventricular Rate:  66 PR Interval:  246 QRS Duration:  98 QT Interval:  412 QTC Calculation: 431 R Axis:   -16  Text Interpretation: Sinus rhythm with 1st degree A-V block Minimal voltage criteria for LVH, may be normal variant ( Cornell product ) When compared with ECG of 12-Feb-2023 05:12, Questionable change in QRS axis Confirmed by Julien Nordmann [phone]) on 08/07/2023 2:38:05 PM    Carotid reviewed with her LEFT CAROTID ARTERY: Similar minor intimal thickening and trace plaque formation. No hemodynamically significant left ICA stenosis, velocity elevation, or turbulent flow. LEFT VERTEBRAL ARTERY:  Normal antegrade flow IMPRESSION: Minor carotid atherosclerosis. Negative for stenosis. Degree of narrowing less than 50% bilaterally by ultrasound criteria.  PMH:   has a past medical history of Arthritis, Benign neoplasm of rectum and anal canal, Diabetes mellitus without complication (HCC), History of vertigo, Hypertension, Immune deficiency disorder (HCC), Infectious colitis, enteritis and gastroenteritis, Partial epilepsy with impairment of consciousness (HCC), Seizures (HCC), Stroke [facility]), Trigger finger of right thumb, and Vitamin D deficiency.  PSH:    Past Surgical History:  Procedure Laterality Date   ABDOMINAL HYSTERECTOMY     BACK SURGERY     COLONOSCOPY WITH PROPOFOL N/A 07/24/2017   Procedure: COLONOSCOPY WITH PROPOFOL;  Surgeon: Wyline Mood, MD;  Location[facility] ENDOSCOPY;  Service: Endoscopy;  Laterality: N/A;   COLONOSCOPY WITH PROPOFOL N/A 08/03/2020   Procedure: COLONOSCOPY WITH PROPOFOL;  Surgeon: Wyline Mood, MD;  Location: [facility] ENDOSCOPY;  Service: Gastroenterology;  Laterality: N/A;   OOPHORECTOMY     SPINE SURGERY  TOTAL HIP ARTHROPLASTY  2017    List below may not be accurate, we have [facility] to verify Current Outpatient Medications  Medication Sig Dispense Refill   divalproex (DEPAKOTE) 250 MG DR tablet Take 250 mg by  mouth 2 (two) times daily.     Insulin Pen Needle (PEN NEEDLES) 32G X 6 MM MISC 1 each by Does not apply route daily. 100 each 12   lamoTRIgine (LAMICTAL) 150 MG tablet Take 150 mg by mouth daily.     LANTUS SOLOSTAR 100 UNIT/ML Solostar Pen Inject 44 Units into the skin at bedtime.     lisinopril-hydrochlorothiazide (ZESTORETIC) 20-25 MG tablet Take 1 tablet by mouth daily.     magnesium oxide (MAG-OX) 400 MG tablet Take 1 tablet by mouth 2 (two) times daily.     Omega-3 Fatty Acids (FISH OIL) 1000 MG CAPS Take by mouth.     Vitamin D, Ergocalciferol, (DRISDOL) 50000 units CAPS capsule Take 1 capsule (50,000 Units total) by mouth every 7 (seven) days. 12 capsule 3   amLODipine (NORVASC) 5 MG tablet Take 1 tablet (5 mg total) by mouth daily. For further refills needs to see provider. 30 tablet 0   ASPIRIN LOW DOSE 81 MG tablet Take 81 mg by mouth daily.     atorvastatin (LIPITOR) 40 MG tablet Take 1 tablet (40 mg total) by mouth every morning. 90 tablet 3   brimonidine (ALPHAGAN) 0.2 % ophthalmic solution 1 drop 2 (two) times daily. (Patient not taking: Reported on 08/07/2023)     empagliflozin (JARDIANCE) 25 MG TABS tablet Take 25 mg by mouth daily. (Patient not taking: Reported on 07/15/2021) 90 tablet 1   gatifloxacin (ZYMAXID) 0.5 % SOLN Place 1 drop into the left eye 4 (four) times daily. (Patient not taking: Reported on 05/30/2023)     glimepiride (AMARYL) 4 MG tablet Take 1 tablet by mouth twice daily 180 tablet 0   hydrOXYzine (ATARAX/VISTARIL) 25 MG tablet Take 1 tablet (25 mg total) by mouth every 6 (six) hours as needed for itching. (Patient not taking: Reported on 06/29/2020) 30 tablet 0   ibuprofen (ADVIL) 600 MG tablet Take 1 tablet (600 mg total) by mouth every 8 (eight) hours as needed for moderate pain or headache. (Patient not taking: Reported on 04/01/2023) 20 tablet 0   insulin degludec (TRESIBA FLEXTOUCH) 100 UNIT/ML SOPN FlexTouch Pen Inject 0.3 mLs (30 Units total) into the skin  daily at 10 pm. (Patient not taking: Reported on 08/07/2023) 3 mL 3   ketorolac (ACULAR) 0.5 % ophthalmic solution Place 1 drop into the left eye 4 (four) times daily. (Patient not taking: Reported on 08/07/2023)     OZEMPIC, 0.25 OR 0.5 MG/DOSE, 2 MG/3ML SOPN Inject 0.5 mg into the skin once a week. (Patient not taking: Reported on 08/07/2023)     prednisoLONE acetate (PRED FORTE) 1 % ophthalmic suspension Place 1 drop into the left eye 4 (four) times daily. (Patient not taking: Reported on 08/07/2023)     quinapril-hydrochlorothiazide (ACCURETIC) 20-25 MG tablet TAKE 1 TABLET BY MOUTH ONCE DAILY 90 tablet 0   No current facility-administered medications for this visit.    Allergies:   Aspirin, Metformin and related, and Penicillins   Social History:  The patient  reports that she quit smoking about 16 years ago. Her smoking use included cigarettes. She started smoking about 59 years ago. She has a 64.5 pack-year smoking history. She has never used smokeless tobacco. She reports that she does not  drink alcohol and does not use drugs.   Family History:   family history includes Aneurysm in her sister; Brain cancer in her mother; Cancer in her sister; Esophageal cancer in her sister.    Review of Systems: Review of Systems  Constitutional:  Positive for malaise/fatigue.  HENT: Negative.    Respiratory: Negative.    Cardiovascular: Negative.   Gastrointestinal: Negative.   Musculoskeletal: Negative.   Neurological: Negative.   Psychiatric/Behavioral: Negative.    All other systems reviewed and are negative.   PHYSICAL EXAM: VS:  BP (!) 156/76 (BP Location: Left Arm, Patient Position: Lying left side, Cuff Size: Normal)   Pulse 66   Ht 5\' 5"  (1.651 m)   Wt 179 lb (81.2 kg)   SpO2 96%   BMI 29.79 kg/m  , BMI Body mass index is 29.79 kg/m. GEN: Well nourished, well developed, in no acute distress HEENT: normal Neck: no JVD, carotid bruits, or masses Cardiac: RRR; no murmurs, rubs, or  gallops,no edema  Respiratory:  clear to auscultation bilaterally, normal work of breathing GI: soft, nontender, nondistended, + BS MS: no deformity or atrophy Skin: warm and dry, no rash Neuro:  Strength and sensation are intact Psych: euthymic mood, full affect   Recent Labs: 02/12/2023: B Natriuretic Peptide 22.8 04/01/2023: ALT 8; BUN 29; Creatinine, Ser 1.13; Hemoglobin 12.3; Platelets 291; Potassium 4.0; Sodium 133    Lipid Panel Lab Results  Component Value Date   CHOL 135 07/18/2018   HDL 44 07/18/2018   LDLCALC 70 07/18/2018   TRIG 106 07/18/2018      Wt Readings from Last 3 Encounters:  08/07/23 179 lb (81.2 kg)  04/01/23 182 lb 15.7 oz (83 kg)  02/12/23 184 lb (83.5 kg)       ASSESSMENT AND PLAN:  Problem List Items Addressed This Visit       Cardiology Problems   Aortic atherosclerosis (HCC) - Primary   Relevant Medications   atorvastatin (LIPITOR) 40 MG tablet   Other Relevant Orders   EKG 12-Lead (Completed)   Hyperlipidemia associated with type 2 diabetes mellitus (HCC)   Relevant Medications   atorvastatin (LIPITOR) 40 MG tablet     Other   Benign hypertensive renal disease   CKD (chronic kidney disease), stage III (HCC)   Other Visit Diagnoses     Diabetes mellitus type 2 with complications (HCC)       Relevant Medications   atorvastatin (LIPITOR) 40 MG tablet      Preop colonoscopy Acceptable risk for procedure No further cardiac testing needed We are trying to verify her medication list  Aortic atherosclerosis Prior imaging, mild diffuse disease, no significant coronary calcification She denies anginal symptoms We will contact [facility] to verify medications Suggest she stay on her Lipitor 40 daily, goal LDL less than 70 Stressed the importance of aggressive diabetes control A1c markedly and chronically elevated 12 Previously referred to endocrinology in 2022, will defer to medicine  Knee pain left Reports having Baker's  cyst, cartilage problems, has chronic knee pain, reports getting worse  Diabetes type 2 with complications Previously referred to endocrine for A1c of 12 Recommend she work closely with primary care  Hyperlipidemia Cholesterol above goal, will need to stay on Lipitor 40 daily May need to add Zetia   Total encounter time more than 50 minutes  Greater than 50% was spent in counseling and coordination of care with the patient    Signed, Dossie Arbour, M.D., Ph.D. [facility]  [place], [place] [phone]

## 2023-08-06 NOTE — Telephone Encounter (Signed)
Pt lmovm stating that she never got a call for her arrival time on Fri for today's procedure

## 2023-08-07 ENCOUNTER — Telehealth: Payer: Self-pay | Admitting: Emergency Medicine

## 2023-08-07 ENCOUNTER — Encounter: Payer: Self-pay | Admitting: Cardiovascular Disease

## 2023-08-07 ENCOUNTER — Ambulatory Visit: Payer: Medicare Other | Attending: Cardiovascular Disease | Admitting: Cardiovascular Disease

## 2023-08-07 VITALS — BP 156/76 | HR 66 | Ht 65.0 in | Wt 179.0 lb

## 2023-08-07 DIAGNOSIS — N183 Chronic kidney disease, stage 3 unspecified: Secondary | ICD-10-CM | POA: Diagnosis not present

## 2023-08-07 DIAGNOSIS — I700 Atherosclerosis of aorta: Secondary | ICD-10-CM | POA: Diagnosis not present

## 2023-08-07 DIAGNOSIS — I129 Hypertensive chronic kidney disease with stage 1 through stage 4 chronic kidney disease, or unspecified chronic kidney disease: Secondary | ICD-10-CM | POA: Diagnosis not present

## 2023-08-07 DIAGNOSIS — E1169 Type 2 diabetes mellitus with other specified complication: Secondary | ICD-10-CM | POA: Diagnosis not present

## 2023-08-07 DIAGNOSIS — E118 Type 2 diabetes mellitus with unspecified complications: Secondary | ICD-10-CM

## 2023-08-07 DIAGNOSIS — E785 Hyperlipidemia, unspecified: Secondary | ICD-10-CM

## 2023-08-07 DIAGNOSIS — Z794 Long term (current) use of insulin: Secondary | ICD-10-CM

## 2023-08-07 MED ORDER — ATORVASTATIN CALCIUM 40 MG PO TABS: 40.0000 mg | ORAL_TABLET | Freq: Every morning | ORAL | 3 refills | Status: DC

## 2023-08-07 NOTE — Patient Instructions (Addendum)
We will call [facility] to get your medication list updated  Medication Instructions:  If you are not currently taking we will start atorvastatin 40 mg daily for cholesterol  If you need a refill on your cardiac medications before your next appointment, please call your pharmacy.   Lab work: No new labs needed  Testing/Procedures: No new testing needed  Follow-Up: At [facility], you and your health needs are our priority.  As part of our continuing mission to provide you with exceptional heart care, we have created designated Provider Care Teams.  These Care Teams include your primary Cardiologist (physician) and Advanced Practice Providers (APPs -  Physician Assistants and Nurse Practitioners) who all work together to provide you with the care you need, when you need it.  You will need a follow up appointment in 12 months  Providers on your designated Care Team:   Nicolasa Ducking, NP Eula Listen, PA-C Cadence Fransico [place], [place]  COVID-19 Vaccine Information can be found at: [email or web address] For questions related to vaccine distribution or appointments, please email [email or web address] or call 9106055890.

## 2023-08-07 NOTE — Telephone Encounter (Signed)
[facility] to get an updated medication list. No answer at the pharmacy. Left a message for a call back.

## 2023-08-08 MED ORDER — EZETIMIBE 10 MG PO TABS: 10.0000 mg | ORAL_TABLET | Freq: Every day | ORAL | 3 refills | Status: DC

## 2023-08-08 MED ORDER — ROSUVASTATIN CALCIUM 5 MG PO TABS: 5.0000 mg | ORAL_TABLET | Freq: Every day | ORAL | 3 refills | Status: DC

## 2023-08-08 NOTE — Telephone Encounter (Signed)
Spoke with [employer] from [facility]. Received updated medication list from Pharmacy. Medication list updated in patient's chart. Per pharmacist patient was on Atorvastatin 40 mg but was stopped in June of 2023 due to elevated CK and muscle cramps. The patient was then started on Omega 3 1000 mg twice daily.  Pharmacy also reported that patient has declined taking GLP-1 due to cost.  Patient currently taking: Lantus 32 units at bedtime Omega 3 1000 mg twice daily Depakote 250 mg twice daily Lisinopril/hydrochlorothiazide 20-25 daily Amlodipine 5 mg daily Asa 81 mg daily Lamotrigine 150 mg daily.

## 2023-08-08 NOTE — Telephone Encounter (Signed)
Spoke with Dr. Mariah Milling. Orders received for Crestor 5 mg daily and Zetia 10 mg daily. Prescriptions sent to preferred pharmacy.

## 2023-08-08 NOTE — Telephone Encounter (Signed)
[facility] is returning call. Transferred to Elon Jester, Charity fundraiser.

## 2023-08-08 NOTE — Addendum Note (Signed)
Addended by: Jani Gravel on: 08/08/2023 10:02 AM   Modules accepted: Orders

## 2023-08-16 ENCOUNTER — Telehealth: Payer: Self-pay

## 2023-08-16 NOTE — Telephone Encounter (Signed)
Chart reviewed.  Patient completed her cardiology visit with Dr. Mariah Milling on 08/07/23.  Per Dr. Windell Hummingbird chart note:  "Preop colonoscopy Acceptable risk for procedure No further cardiac testing needed We are trying to verify her medication listrt note".  I will contact patient to see if she would like to have her colonoscopy scheduled with Dr. Tobi Bastos.  Thanks, [place], [place]

## 2023-09-19 ENCOUNTER — Encounter: Payer: Self-pay | Admitting: Family Medicine

## 2023-09-20 ENCOUNTER — Encounter: Payer: Self-pay | Admitting: Family Medicine

## 2023-09-20 ENCOUNTER — Other Ambulatory Visit: Payer: Self-pay | Admitting: Family Medicine

## 2023-09-20 DIAGNOSIS — I2510 Atherosclerotic heart disease of native coronary artery without angina pectoris: Secondary | ICD-10-CM

## 2023-10-03 ENCOUNTER — Telehealth: Payer: Self-pay

## 2023-10-03 NOTE — Telephone Encounter (Signed)
Marcelino Duster, we received a message from patient's PCP stating that the patient is ready to schedule her colonoscopy. Please give her a call back when you have a chance. Thank you.

## 2023-10-03 NOTE — Telephone Encounter (Signed)
Attempted to contact patient to schedule her for her colonoscopy with Dr. Tobi Bastos.  I was unable to reach he because her voice mail is currently full.  Thanks,  [place], [place]

## 2023-10-10 ENCOUNTER — Encounter: Payer: Self-pay | Admitting: *Deleted

## 2023-10-17 ENCOUNTER — Encounter: Payer: Self-pay | Admitting: Family Medicine

## 2023-10-17 DIAGNOSIS — Z1231 Encounter for screening mammogram for malignant neoplasm of breast: Secondary | ICD-10-CM

## 2023-10-24 ENCOUNTER — Ambulatory Visit
Admission: RE | Admit: 2023-10-24 | Discharge: 2023-10-24 | Disposition: A | Discharge status: Home / Self Care | Payer: Medicare Other | Source: Ambulatory Visit | Attending: Family Medicine | Admitting: Family Medicine

## 2023-10-24 DIAGNOSIS — I2510 Atherosclerotic heart disease of native coronary artery without angina pectoris: Secondary | ICD-10-CM | POA: Diagnosis present

## 2023-11-02 ENCOUNTER — Emergency Department
Admission: EM | Admit: 2023-11-02 | Discharge: 2023-11-02 | Disposition: A | Discharge status: Home / Self Care | Payer: Medicare Other | Attending: Emergency Medicine | Admitting: Emergency Medicine

## 2023-11-02 ENCOUNTER — Emergency Department: Payer: Medicare Other

## 2023-11-02 ENCOUNTER — Other Ambulatory Visit: Payer: Self-pay

## 2023-11-02 DIAGNOSIS — E1165 Type 2 diabetes mellitus with hyperglycemia: Secondary | ICD-10-CM | POA: Diagnosis not present

## 2023-11-02 DIAGNOSIS — R262 Difficulty in walking, not elsewhere classified: Secondary | ICD-10-CM | POA: Insufficient documentation

## 2023-11-02 DIAGNOSIS — Z8673 Personal history of transient ischemic attack (TIA), and cerebral infarction without residual deficits: Secondary | ICD-10-CM | POA: Insufficient documentation

## 2023-11-02 DIAGNOSIS — R42 Dizziness and giddiness: Secondary | ICD-10-CM | POA: Diagnosis present

## 2023-11-02 DIAGNOSIS — J439 Emphysema, unspecified: Secondary | ICD-10-CM | POA: Insufficient documentation

## 2023-11-02 DIAGNOSIS — R519 Headache, unspecified: Secondary | ICD-10-CM | POA: Diagnosis not present

## 2023-11-02 DIAGNOSIS — R9082 White matter disease, unspecified: Secondary | ICD-10-CM | POA: Diagnosis not present

## 2023-11-02 DIAGNOSIS — M25512 Pain in left shoulder: Secondary | ICD-10-CM | POA: Diagnosis not present

## 2023-11-02 DIAGNOSIS — I700 Atherosclerosis of aorta: Secondary | ICD-10-CM | POA: Diagnosis not present

## 2023-11-02 DIAGNOSIS — R739 Hyperglycemia, unspecified: Secondary | ICD-10-CM

## 2023-11-02 LAB — CBC
HCT: 41.5 % (ref 36.0–46.0)
Hemoglobin: 13.3 g/dL (ref 12.0–15.0)
MCH: 26.4 pg (ref 26.0–34.0)
MCHC: 32 g/dL (ref 30.0–36.0)
MCV: 82.5 fL (ref 80.0–100.0)
Platelets: 234 10*3/uL (ref 150–400)
RBC: 5.03 MIL/uL (ref 3.87–5.11)
RDW: 15.5 % (ref 11.5–15.5)
WBC: 5.5 10*3/uL (ref 4.0–10.5)
nRBC: 0 % (ref 0.0–0.2)

## 2023-11-02 LAB — ETHANOL: Alcohol, Ethyl (B): <10 mg/dL (ref <10)

## 2023-11-02 LAB — DIFFERENTIAL
Abs Immature Granulocytes: 0.01 10*3/uL (ref 0.00–0.07)
Basophils Absolute: 0 10*3/uL (ref 0.0–0.1)
Basophils Relative: 1 %
Eosinophils Absolute: 0 10*3/uL (ref 0.0–0.5)
Eosinophils Relative: 1 %
Immature Granulocytes: 0 %
Lymphocytes Relative: 34 %
Lymphs Abs: 1.9 10*3/uL (ref 0.7–4.0)
Monocytes Absolute: 0.5 10*3/uL (ref 0.1–1.0)
Monocytes Relative: 9 %
Neutro Abs: 3 10*3/uL (ref 1.7–7.7)
Neutrophils Relative %: 55 %

## 2023-11-02 LAB — COMPREHENSIVE METABOLIC PANEL
ALT: 11 U/L (ref 0–44)
AST: 16 U/L (ref 15–41)
Albumin: 3.7 g/dL (ref 3.5–5.0)
Alkaline Phosphatase: 63 U/L (ref 38–126)
Anion gap: 8 (ref 5–15)
BUN: 18 mg/dL (ref 8–23)
CO2: 28 mmol/L (ref 22–32)
Calcium: 9.1 mg/dL (ref 8.9–10.3)
Chloride: 99 mmol/L (ref 98–111)
Creatinine, Ser: 0.9 mg/dL (ref 0.44–1.00)
GFR, Estimated: >60 mL/min (ref >60)
Glucose, Bld: 309 mg/dL — ABNORMAL HIGH (ref 70–99)
Potassium: 4.1 mmol/L (ref 3.5–5.1)
Sodium: 135 mmol/L (ref 135–145)
Total Bilirubin: 0.6 mg/dL (ref <1.2)
Total Protein: 7.5 g/dL (ref 6.5–8.1)

## 2023-11-02 LAB — PROTIME-INR
INR: 1.1 (ref 0.8–1.2)
Prothrombin Time: 14.6 s (ref 11.4–15.2)

## 2023-11-02 LAB — TROPONIN I (HIGH SENSITIVITY)
Troponin I (High Sensitivity): 21 ng/L — ABNORMAL HIGH (ref <18)
Troponin I (High Sensitivity): 21 ng/L — ABNORMAL HIGH (ref <18)

## 2023-11-02 LAB — VALPROIC ACID LEVEL: Valproic Acid Lvl: 36 ug/mL — ABNORMAL LOW (ref 50.0–100.0)

## 2023-11-02 LAB — APTT: aPTT: 25 s (ref 24–36)

## 2023-11-02 LAB — CBG MONITORING, ED: Glucose-Capillary: 193 mg/dL — ABNORMAL HIGH (ref 70–99)

## 2023-11-02 MED ORDER — SODIUM CHLORIDE 0.9% FLUSH: 3.0000 mL | Freq: Once | INTRAVENOUS | Status: DC

## 2023-11-02 MED ORDER — INSULIN ASPART 100 UNIT/ML IJ SOLN
10.0000 [IU] | Freq: Once | INTRAMUSCULAR | Status: AC
  Administered 2023-11-02: 10 [IU] via INTRAVENOUS
  Filled 2023-11-02: qty 1

## 2023-11-02 MED ORDER — SODIUM CHLORIDE 0.9 % IV BOLUS
500.0000 mL | Freq: Once | INTRAVENOUS | Status: AC
  Administered 2023-11-02: 500 mL via INTRAVENOUS

## 2023-11-02 MED ORDER — MECLIZINE HCL 25 MG PO TABS
12.5000 mg | ORAL_TABLET | Freq: Once | ORAL | Status: AC
  Administered 2023-11-02: 12.5 mg via ORAL
  Filled 2023-11-02: qty 1

## 2023-11-02 MED ORDER — IOHEXOL 350 MG/ML SOLN
75.0000 mL | Freq: Once | INTRAVENOUS | Status: AC | PRN
  Administered 2023-11-02: 75 mL via INTRAVENOUS

## 2023-11-02 MED ORDER — ACETAMINOPHEN 500 MG PO TABS
1000.0000 mg | ORAL_TABLET | Freq: Once | ORAL | Status: AC
  Administered 2023-11-02: 1000 mg via ORAL
  Filled 2023-11-02: qty 2

## 2023-11-02 NOTE — ED Triage Notes (Signed)
Pt to ED for waking up with dizziness, difficulty walking, headache. Headache x3 days. LKW 2300 when went to bed. Face symmetrical. Equal grip and strength. Pt with difficulty lifting arms d/t "spurs in shoulders"

## 2023-11-02 NOTE — Discharge Instructions (Addendum)
Your CTA is as below.  There is no evidence of any stroke.  I suspect this could be related to your elevated sugars and so is important that you monitor your sugars and discussed with your primary care doctor medication adjustment related to it.  Also make sure that your diet is staying away from carbs or foods high in sugar.  You can also follow-up with ENT in case this could be related to some vertigo if this is not getting better with treatment of your sugars.  Return to the ER for worsening symptoms, fevers or any other concerns.   MPRESSION: 1. No intracranial large vessel occlusion or significant stenosis. Mildly diminished opacification of the right MCA branches compared to the left, without focal stenosis. 2. No hemodynamically significant stenosis in the neck. 3. Beading of the bilateral ICAs, which can be seen in the setting of fibromuscular dysplasia. 4. Aortic atherosclerosis.

## 2023-11-02 NOTE — ED Notes (Signed)
Pt to CT

## 2023-11-02 NOTE — ED Provider Notes (Signed)
[facility] Provider Note    Event Date/Time   First MD Initiated Contact with Patient 11/02/23 1231     (approximate)   History   Dizziness   HPI  Brittany Sherman is a 77 y.o. female who comes in with concerns for dizziness.  To note patient had Doppler carotids done a few days ago in November 2024.  I reviewed these where she has no hemodynamically significant stenosis noted.  Patient reports a headache that started on Wednesday.  She reports is not typical for her to get headaches.  She reports some dizziness associated with it.  She states the dizziness is constantly there although slightly worsened with standing although does report eating and drinking well.  Denies ever having anything like this happen before.  She does report a history of stroke and show given she was having the dizziness she was worried and came to be evaluated.  She denies any chest pain, shortness of breath or other concerns.   Physical Exam   Triage Vital Signs: ED Triage Vitals [11/02/23 0921]  Encounter Vitals Group     BP (!) 161/67     Systolic BP Percentile      Diastolic BP Percentile      Pulse Rate 62     Resp 18     Temp 98.6 F (37 C)     Temp src      SpO2 98 %     Weight 180 lb (81.6 kg)     Height 5\' 5"  (1.651 m)     Head Circumference      Peak Flow      Pain Score 0     Pain Loc      Pain Education      Exclude from Growth Chart     Most recent vital signs: Vitals:   11/02/23 0921  BP: (!) 161/67  Pulse: 62  Resp: 18  Temp: 98.6 F (37 C)  SpO2: 98%     General: Awake, no distress.  CV:  Good peripheral perfusion.  Resp:  Normal effort.  Abd:  No distention.  Other:  Difficult to assess finger-to-nose given she is got some left shoulder pain which she reports is when the weather gets cold but it was intact on the right.  Otherwise cranial nerves appear intact. TMs bilaterally with some mild wax noted but no full obstruction.  ED Results /  Procedures / Treatments   Labs (all labs ordered are listed, but only abnormal results are displayed) Labs Reviewed  COMPREHENSIVE METABOLIC PANEL - Abnormal; Notable for the following components:      Result Value   Glucose, Bld 309 (*)    All other components within normal limits  PROTIME-INR  APTT  CBC  DIFFERENTIAL  ETHANOL     EKG  My interpretation of EKG:  Normal sinus rhythm 61 without any ST elevation or T wave inversions, normal intervals  RADIOLOGY I have reviewed the CT head personally interpreted no evidence of intracranial hemorrhage  PROCEDURES:  Critical Care performed: Yes, see critical care procedure note(s)  .1-3 Lead EKG Interpretation  Performed by: Concha Se, MD Authorized by: Concha Se, MD     Interpretation: normal     ECG rate:  60   ECG rate assessment: normal     Rhythm: sinus rhythm     Ectopy: none     Conduction: normal   .Critical Care  Performed by: Concha Se,  MD Authorized by: Concha Se, MD   Critical care provider statement:    Critical care time (minutes):  30   Critical care was necessary to treat or prevent imminent or life-threatening deterioration of the following conditions: hyperglycemia.   Critical care was time spent personally by me on the following activities:  Development of treatment plan with patient or surrogate, discussions with consultants, evaluation of patient's response to treatment, examination of patient, ordering and review of laboratory studies, ordering and review of radiographic studies, ordering and performing treatments and interventions, pulse oximetry, re-evaluation of patient's condition and review of old charts    MEDICATIONS ORDERED IN ED: Medications  sodium chloride flush (NS) 0.9 % injection 3 mL (3 mLs Intravenous Not Given 11/02/23 0937)     IMPRESSION / MDM / ASSESSMENT AND PLAN / ED COURSE  I reviewed the triage vital signs and the nursing notes.   Patient's  presentation is most consistent with acute presentation with potential threat to life or bodily function.   Patient comes in with dizziness with a history of stroke.  Blood work was ordered evaluate for [employer] abnormalities, AKI, orthostatics, ACS.  Will get CTA to evaluate for any type of aneurysm given headache, dissection.  She had recent ultrasound that does not show any obvious plaque.  Will get MRI to evaluate for stroke.  CMP shows elevated glucose of 300.  Coags normal.  CBC normal  Patient appears t run elevated so unsuren if this is what is causing her symptoms however I will treat with some insulin, fluids and will make sure no stroke.   CTA negative  IMPRESSION: No acute intracranial process. No evidence of acute or subacute infarct.  Valproic acid level not significantly elevated to suggest toxicity.  Her troponin was slightly elevated but similar to 8 months ago and stable upon repeat so unlikely related to ACS.  Patient denied any urinary symptoms.  She just went to the bathroom and did not want to stay any longer to give a urine sample.  She denies any urinary symptoms.  Denies any fevers.  No white count elevation to suggest UTI so this seems less likely.  We discussed that this could be related to her hyperglycemia and some dehydration.  Patient is ambulatory without any issues and feels comfortable with discharge home.  She is to follow-up with her primary care doctor to discuss her medications for her diabetes as well as following up with ENT for potential vertigo.  She expressed understanding felt comfortable with this plan.  The patient is on the cardiac monitor to evaluate for evidence of arrhythmia and/or significant heart rate changes.      FINAL CLINICAL IMPRESSION(S) / ED DIAGNOSES   Final diagnoses:  Dizziness  Hyperglycemia     Rx / DC Orders   ED Discharge Orders     None        Note:  This document was prepared using Dragon voice recognition  software and may include unintentional dictation errors.   Concha Se, MD 11/02/23 1520

## 2023-11-02 NOTE — ED Notes (Signed)
Pt back from CT

## 2023-11-14 ENCOUNTER — Other Ambulatory Visit: Payer: Self-pay

## 2023-11-19 NOTE — Progress Notes (Unsigned)
Celso Amy, PA-C [address]  [address]  [place], [place] [address]  Main: [phone]  Fax: [phone]   Primary Care Physician: Lorn Junes, FNP (Inactive)  Primary Gastroenterologist:  ***  CC: History of colon polyps  HPI: Brittany Sherman is a 77 y.o. female presents to schedule 3-year repeat colonoscopy due to history of multiple adenomatous colon polyps.  07/2020 colonoscopy by Dr. Tobi Bastos: 12 mm polyp removed from tubular adenoma descending colon, 3 (4 mm to 6 mm) tubular adenoma polyps removed from descending and ascending colon.  2 (6 mm to 14 mm) tubular adenoma removed from cecum.  Excellent prep.  Repeat in 3 years (due 07/2023).  Medical history significant for hypertension, diabetes, history of seizures, chronic pain, history of CVA 15 years ago, former smoker.  She saw cardiologist Dr. Mariah Milling 08/07/2023 and received cardiac clearance to proceed with colonoscopy.  Acceptable risk for procedure.  No further cardiac testing needed.  Current Outpatient Medications  Medication Sig Dispense Refill   amLODipine (NORVASC) 5 MG tablet Take 1 tablet (5 mg total) by mouth daily. For further refills needs to see provider. 30 tablet 0   ASPIRIN LOW DOSE 81 MG tablet Take 81 mg by mouth daily.     brimonidine (ALPHAGAN) 0.2 % ophthalmic solution 1 drop 2 (two) times daily. (Patient not taking: Reported on 08/07/2023)     CALCIUM 600+D 600-10 MG-MCG TABS Take 1 tablet by mouth 2 (two) times daily.     divalproex (DEPAKOTE) 250 MG DR tablet Take 250 mg by mouth 2 (two) times daily.     empagliflozin (JARDIANCE) 25 MG TABS tablet Take 25 mg by mouth daily. (Patient not taking: Reported on 07/15/2021) 90 tablet 1   ezetimibe (ZETIA) 10 MG tablet Take 1 tablet (10 mg total) by mouth daily. 90 tablet 3   gatifloxacin (ZYMAXID) 0.5 % SOLN Place 1 drop into the left eye 4 (four) times daily. (Patient not taking: Reported on 05/30/2023)     glimepiride (AMARYL) 4 MG tablet Take 1  tablet by mouth twice daily (Patient not taking: Reported on 08/08/2023) 180 tablet 0   hydrOXYzine (ATARAX/VISTARIL) 25 MG tablet Take 1 tablet (25 mg total) by mouth every 6 (six) hours as needed for itching. (Patient not taking: Reported on 06/29/2020) 30 tablet 0   ibuprofen (ADVIL) 600 MG tablet Take 1 tablet (600 mg total) by mouth every 8 (eight) hours as needed for moderate pain or headache. (Patient not taking: Reported on 04/01/2023) 20 tablet 0   insulin degludec (TRESIBA FLEXTOUCH) 100 UNIT/ML SOPN FlexTouch Pen Inject 0.3 mLs (30 Units total) into the skin daily at 10 pm. (Patient not taking: Reported on 08/07/2023) 3 mL 3   Insulin Pen Needle (PEN NEEDLES) 32G X 6 MM MISC 1 each by Does not apply route daily. (Patient not taking: Reported on 08/08/2023) 100 each 12   ketorolac (ACULAR) 0.5 % ophthalmic solution Place 1 drop into the left eye 4 (four) times daily. (Patient not taking: Reported on 08/07/2023)     lamoTRIgine (LAMICTAL) 150 MG tablet Take 150 mg by mouth daily.     LANTUS SOLOSTAR 100 UNIT/ML Solostar Pen Inject 32 Units into the skin at bedtime.     lisinopril-hydrochlorothiazide (ZESTORETIC) 20-25 MG tablet Take 1 tablet by mouth daily.     magnesium oxide (MAG-OX) 400 MG tablet Take 1 tablet by mouth 2 (two) times daily. (Patient not taking: Reported on 08/08/2023)     Omega-3 Fatty  Acids (FISH OIL) 1000 MG CAPS Take by mouth in the morning and at bedtime.     OZEMPIC, 0.25 OR 0.5 MG/DOSE, 2 MG/3ML SOPN Inject 0.5 mg into the skin once a week. (Patient not taking: Reported on 08/07/2023)     prednisoLONE acetate (PRED FORTE) 1 % ophthalmic suspension Place 1 drop into the left eye 4 (four) times daily. (Patient not taking: Reported on 08/07/2023)     rosuvastatin (CRESTOR) 5 MG tablet Take 1 tablet (5 mg total) by mouth daily. 90 tablet 3   Vitamin D, Ergocalciferol, (DRISDOL) 50000 units CAPS capsule Take 1 capsule (50,000 Units total) by mouth every 7 (seven) days. (Patient not  taking: Reported on 08/08/2023) 12 capsule 3   No current facility-administered medications for this visit.    Allergies as of 11/20/2023 - Review Complete 11/02/2023  Allergen Reaction Noted   Aspirin  12/11/2015   Metformin and related Diarrhea 04/02/2017   Penicillins Rash and Other (See Comments) 12/11/2015    Past Medical History:  Diagnosis Date   Arthritis    Benign neoplasm of rectum and anal canal    Diabetes mellitus without complication (HCC)    History of vertigo    Hypertension    Immune deficiency disorder (HCC)    Infectious colitis, enteritis and gastroenteritis    Partial epilepsy with impairment of consciousness (HCC)    Seizures (HCC)    Stroke (HCC)    Trigger finger of right thumb    Vitamin D deficiency     Past Surgical History:  Procedure Laterality Date   ABDOMINAL HYSTERECTOMY     BACK SURGERY     COLONOSCOPY WITH PROPOFOL N/A 07/24/2017   Procedure: COLONOSCOPY WITH PROPOFOL;  Surgeon: Wyline Mood, MD;  Location: [facility] ENDOSCOPY;  Service: Endoscopy;  Laterality: N/A;   COLONOSCOPY WITH PROPOFOL N/A 08/03/2020   Procedure: COLONOSCOPY WITH PROPOFOL;  Surgeon: Wyline Mood, MD;  Location: [facility] ENDOSCOPY;  Service: Gastroenterology;  Laterality: N/A;   OOPHORECTOMY     SPINE SURGERY     TOTAL HIP ARTHROPLASTY  2017    Review of Systems:    All systems reviewed and negative except where noted in HPI.   Physical Examination:   There were no vitals taken for this visit.  General: Well-nourished, well-developed in no acute distress.  Lungs: Clear to auscultation bilaterally. Non-labored. Heart: Regular rate and rhythm, no murmurs rubs or gallops.  Abdomen: Bowel sounds are normal; Abdomen is Soft; No hepatosplenomegaly, masses or hernias;  No Abdominal Tenderness; No guarding or rebound tenderness. Neuro: Alert and oriented x 3.  Grossly intact.  Psych: Alert and cooperative, normal mood and affect.   Imaging Studies: CT ANGIO HEAD NECK W WO  CM  Result Date: 11/02/2023 CLINICAL DATA:  Transient ischemic attack, difficulty walking EXAM: CT ANGIOGRAPHY HEAD AND NECK WITH AND WITHOUT CONTRAST TECHNIQUE: Multidetector CT imaging of the head and neck was performed using the standard protocol during bolus administration of intravenous contrast. Multiplanar CT image reconstructions and MIPs were obtained to evaluate the vascular anatomy. Carotid stenosis measurements (when applicable) are obtained utilizing NASCET criteria, using the distal internal carotid diameter as the denominator. RADIATION DOSE REDUCTION: This exam was performed according to the departmental dose-optimization program which includes automated exposure control, adjustment of the mA and/or kV according to patient size and/or use of iterative reconstruction technique. CONTRAST:  75mL OMNIPAQUE IOHEXOL 350 MG/ML SOLN COMPARISON:  No prior CTA available, correlation is made with 11/02/2023 CT head and MRI head FINDINGS:  CT HEAD FINDINGS For noncontrast findings, please see same day CT head. CTA NECK FINDINGS Aortic arch: Standard branching. Imaged portion shows no evidence of aneurysm or dissection. No significant stenosis of the major arch vessel origins. Mild aortic atherosclerosis. Right carotid system: No evidence of dissection, occlusion, or hemodynamically significant stenosis (greater than 50%). Retropharyngeal course of the proximal right ICA. Beading of the proximal to mid right ICA (series 6, image 176). Left carotid system: No evidence of dissection, occlusion, or hemodynamically significant stenosis (greater than 50%). Beading of the mid left ICA (series 7, image 111). Vertebral arteries: No evidence of dissection, occlusion, or hemodynamically significant stenosis (greater than 50%). Skeleton: No acute osseous abnormality. Degenerative changes in the cervical spine. Other neck: No acute finding. Upper chest: No focal pulmonary opacity or pleural effusion. Emphysema. Review of  the MIP images confirms the above findings CTA HEAD FINDINGS Anterior circulation: Both internal carotid arteries are patent to the termini, with calcified and noncalcified plaque but without significant stenosis. Redemonstrated atherosclerotic pseudo lesion in the left cavernous carotid (series 6, image 111). A1 segments patent. Normal anterior communicating artery. Anterior cerebral arteries are patent to their distal aspects without significant stenosis. No M1 stenosis or occlusion. Mildly diminished opacification of the right MCA branches compared to the left, although no focal stenosis is seen. MCA branches perfused to their distal aspects without significant stenosis. Posterior circulation: Vertebral arteries patent to the vertebrobasilar junction without significant stenosis. Posterior inferior cerebellar arteries patent proximally. Basilar patent to its distal aspect without significant stenosis. Superior cerebellar arteries patent proximally. Patent P1 segments. PCAs perfused to their distal aspects without significant stenosis. The bilateral posterior communicating arteries are not visualized. Venous sinuses: As permitted by contrast timing, patent. Anatomic variants: None significant. No evidence of aneurysm or vascular malformation. Review of the MIP images confirms the above findings IMPRESSION: 1. No intracranial large vessel occlusion or significant stenosis. Mildly diminished opacification of the right MCA branches compared to the left, without focal stenosis. 2. No hemodynamically significant stenosis in the neck. 3. Beading of the bilateral ICAs, which can be seen in the setting of fibromuscular dysplasia. 4. Aortic atherosclerosis. Aortic Atherosclerosis (ICD10-I70.0) and Emphysema (ICD10-J43.9). Electronically Signed   By: Wiliam Ke M.D.   On: 11/02/2023 14:51   MR BRAIN WO CONTRAST  Result Date: 11/02/2023 CLINICAL DATA:  Dizziness, difficulty walking, headache EXAM: MRI HEAD WITHOUT  CONTRAST TECHNIQUE: Multiplanar, multiecho pulse sequences of the brain and surrounding structures were obtained without intravenous contrast. COMPARISON:  09/17/2006 MRI head, correlation is also made with 11/02/2023 CT head FINDINGS: Brain: No restricted diffusion to suggest acute or subacute infarct. No acute hemorrhage, mass, mass effect, or midline shift. No hydrocephalus or extra-axial collection. Craniocervical junction within normal limits. Partial empty sella. Hemosiderin deposition in the superior right cerebellar sulci (series 10, image 23) may be the sequela of prior subarachnoid hemorrhage. Scattered T2 hyperintense signal in the periventricular white matter, likely the sequela of mild chronic small vessel ischemic disease. Vascular: Normal arterial flow voids. Skull and upper cervical spine: Normal marrow signal. Sinuses/Orbits: Minimal mucosal thickening in the ethmoid air cells. Status post bilateral lens replacements. Other: Fluid in the right-greater-than-left mastoid air cells. IMPRESSION: No acute intracranial process. No evidence of acute or subacute infarct. Electronically Signed   By: Wiliam Ke M.D.   On: 11/02/2023 14:24   CT HEAD WO CONTRAST  Result Date: 11/02/2023 CLINICAL DATA:  Neuro deficit with acute stroke suspected. Dizziness and difficulty walking with headache for  3 days. EXAM: CT HEAD WITHOUT CONTRAST TECHNIQUE: Contiguous axial images were obtained from the base of the skull through the vertex without intravenous contrast. RADIATION DOSE REDUCTION: This exam was performed according to the departmental dose-optimization program which includes automated exposure control, adjustment of the mA and/or kV according to patient size and/or use of iterative reconstruction technique. COMPARISON:  11/25/2021 FINDINGS: Brain: No evidence of acute infarction, hemorrhage, hydrocephalus, extra-axial collection or mass lesion/mass effect. Partially empty sella. Mild for age cerebral  volume loss. Vascular: No hyperdense vessel or unexpected calcification. Skull: Hyperostosis interna.  No acute finding Sinuses/Orbits: No acute finding IMPRESSION: No acute or interval finding. Electronically Signed   By: Tiburcio Pea M.D.   On: 11/02/2023 09:58   US Carotid Bilateral  Result Date: 10/30/2023 CLINICAL DATA:  77 year old female with a history of coronary artery disease EXAM: BILATERAL CAROTID DUPLEX ULTRASOUND TECHNIQUE: Wallace Cullens scale imaging, color Doppler and duplex ultrasound were performed of bilateral carotid and vertebral arteries in the neck. COMPARISON:  None Available. FINDINGS: Criteria: Quantification of carotid stenosis is based on velocity parameters that correlate the residual internal carotid diameter with NASCET-based stenosis levels, using the diameter of the distal internal carotid lumen as the denominator for stenosis measurement. The following velocity measurements were obtained: RIGHT ICA:  Systolic 92 cm/sec, Diastolic 17 cm/sec CCA:  72 cm/sec SYSTOLIC ICA/CCA RATIO:  1.3 ECA:  110 cm/sec LEFT ICA:  Systolic 69 cm/sec, Diastolic 20 cm/sec CCA:  65 cm/sec SYSTOLIC ICA/CCA RATIO:  1.1 ECA:  115 cm/sec Right Brachial SBP: Not acquired Left Brachial SBP: Not acquired RIGHT CAROTID ARTERY: No significant calcified disease of the right common carotid artery. Intermediate waveform maintained. Homogeneous plaque without significant calcifications at the right carotid bifurcation. Low resistance waveform of the right ICA. No significant tortuosity. RIGHT VERTEBRAL ARTERY: Antegrade flow with low resistance waveform. LEFT CAROTID ARTERY: No significant calcified disease of the left common carotid artery. Intermediate waveform maintained. Homogeneous plaque at the left carotid bifurcation without significant calcifications. Low resistance waveform of the left ICA. LEFT VERTEBRAL ARTERY:  Antegrade flow with low resistance waveform. IMPRESSION: Color duplex indicates minimal  homogeneous plaque, with no hemodynamically significant stenosis by duplex criteria in the extracranial cerebrovascular circulation. Signed, Yvone Neu. Miachel Roux, RPVI [facility] Electronically Signed   By: Gilmer Mor D.O.   On: 10/30/2023 11:09    Assessment and Plan:   Brittany Sherman is a 77 y.o. y/o female   1.  History of multiple adenomatous colon polyps  Scheduling 3-year repeat surveillance colonoscopy  She recently received cardiac clearance from Dr. Mariah Milling  She is acceptable risk to proceed with colonoscopy    Celso Amy, PA-C  Follow up ***  BP check ***

## 2023-11-20 ENCOUNTER — Ambulatory Visit: Payer: Medicare Other | Admitting: Physician Assistant

## 2023-11-21 ENCOUNTER — Emergency Department
Admission: EM | Admit: 2023-11-21 | Discharge: 2023-11-21 | Disposition: A | Discharge status: Home / Self Care | Payer: Medicare Other | Attending: Emergency Medicine | Admitting: Emergency Medicine

## 2023-11-21 ENCOUNTER — Other Ambulatory Visit: Payer: Self-pay

## 2023-11-21 ENCOUNTER — Emergency Department: Payer: Medicare Other

## 2023-11-21 DIAGNOSIS — M25512 Pain in left shoulder: Secondary | ICD-10-CM | POA: Insufficient documentation

## 2023-11-21 DIAGNOSIS — N189 Chronic kidney disease, unspecified: Secondary | ICD-10-CM | POA: Diagnosis not present

## 2023-11-21 DIAGNOSIS — I129 Hypertensive chronic kidney disease with stage 1 through stage 4 chronic kidney disease, or unspecified chronic kidney disease: Secondary | ICD-10-CM | POA: Diagnosis not present

## 2023-11-21 DIAGNOSIS — E1122 Type 2 diabetes mellitus with diabetic chronic kidney disease: Secondary | ICD-10-CM | POA: Insufficient documentation

## 2023-11-21 MED ORDER — LIDOCAINE 5 % EX PTCH: 1.0000 | MEDICATED_PATCH | Freq: Two times a day (BID) | CUTANEOUS | 0 refills | Status: AC

## 2023-11-21 MED ORDER — LIDOCAINE 5 % EX PTCH
1.0000 | MEDICATED_PATCH | CUTANEOUS | Status: DC
Start: 2023-11-21 — End: 2023-11-21
  Administered 2023-11-21: 1 via TRANSDERMAL
  Filled 2023-11-21: qty 1

## 2023-11-21 MED ORDER — ACETAMINOPHEN 500 MG PO TABS
1000.0000 mg | ORAL_TABLET | Freq: Once | ORAL | Status: AC
  Administered 2023-11-21: 1000 mg via ORAL
  Filled 2023-11-21: qty 2

## 2023-11-21 NOTE — ED Triage Notes (Signed)
C?O left shoulder and arm pain x 1 day. States fell on shoulder about 2 weeks ago.  Pain worse with ROM and movement of extremity.

## 2023-11-21 NOTE — ED Provider Notes (Signed)
[facility] Provider Note    Event Date/Time   First MD Initiated Contact with Patient 11/21/23 1600     (approximate)   History   Chief Complaint Shoulder Pain   HPI  Brittany Sherman is a 77 y.o. female with past medical history of hypertension, diabetes, stroke, seizures, and CKD who presents to the ED complaining of shoulder pain.  Patient reports that she has had a couple weeks of pain in her left shoulder which is worse whenever she goes to lift up her left arm.  She states that she had a fall couple of weeks ago but pain did not initially seem all that bad, did get acutely worse over the past 2 days.  She denies any fevers, cough, chest pain, shortness of breath.  Pain does radiate from her left shoulder into her left upper arm, but she has not noticed any redness or swelling.  She denies any numbness or weakness in the arm.     Physical Exam   Triage Vital Signs: ED Triage Vitals  Encounter Vitals Group     BP 11/21/23 1422 (!) 152/65     Systolic BP Percentile --      Diastolic BP Percentile --      Pulse Rate 11/21/23 1422 79     Resp 11/21/23 1422 16     Temp 11/21/23 1422 97.8 F (36.6 C)     Temp Source 11/21/23 1422 Oral     SpO2 11/21/23 1422 96 %     Weight 11/21/23 1420 179 lb 14.3 oz (81.6 kg)     Height --      Head Circumference --      Peak Flow --      Pain Score 11/21/23 1419 9     Pain Loc --      Pain Education --      Exclude from Growth Chart --     Most recent vital signs: Vitals:   11/21/23 1422  BP: (!) 152/65  Pulse: 79  Resp: 16  Temp: 97.8 F (36.6 C)  SpO2: 96%    Constitutional: Alert and oriented. Eyes: Conjunctivae are normal. Head: Atraumatic. Nose: No congestion/rhinnorhea. Mouth/Throat: Mucous membranes are moist.  Cardiovascular: Normal rate, regular rhythm. Grossly normal heart sounds.  2+ radial pulses bilaterally. Respiratory: Normal respiratory effort.  No retractions. Lungs  CTAB. Gastrointestinal: Soft and nontender. No distention. Musculoskeletal: No lower extremity tenderness nor edema.  Diffuse tenderness to palpation of left shoulder with no obvious deformity.  Pain noted upon range of motion of left shoulder. Neurologic:  Normal speech and language. No gross focal neurologic deficits are appreciated.    ED Results / Procedures / Treatments   Labs (all labs ordered are listed, but only abnormal results are displayed) Labs Reviewed - No data to display   EKG  ED ECG REPORT I, Chesley Noon, the attending physician, personally viewed and interpreted this ECG.   Date: 11/21/2023  EKG Time: 14:27  Rate: 78  Rhythm: normal sinus rhythm  Axis: Normal  Intervals:first-degree A-V block   ST&T Change: None  RADIOLOGY Left shoulder x-ray reviewed and interpreted by me with no fracture or dislocation.  PROCEDURES:  Critical Care performed: No  Procedures   MEDICATIONS ORDERED IN ED: Medications  lidocaine (LIDODERM) 5 % 1 patch (1 patch Transdermal Patch Applied 11/21/23 1637)  acetaminophen (TYLENOL) tablet 1,000 mg (1,000 mg Oral Given 11/21/23 1638)     IMPRESSION / MDM / ASSESSMENT AND  PLAN / ED COURSE  I reviewed the triage vital signs and the nursing notes.                              77 y.o. female with past medical history of hypertension, diabetes, stroke, seizures, and CKD who presents to the ED complaining of 2 weeks of gradually worsening left shoulder pain following a fall.  Patient's presentation is most consistent with acute presentation with potential threat to life or bodily function.  Differential diagnosis includes, but is not limited to, fracture, dislocation, septic arthritis, osteoarthritis, ACS.  Patient nontoxic-appearing and in no acute distress, vital signs are unremarkable.  EKG shows no evidence of arrhythmia or ischemia, pain is brought on with palpation and range of motion of left shoulder and I doubt ACS.   No findings concerning for septic arthritis, x-ray imaging does show significant degenerative changes concerning for osteoarthritis versus inflammatory arthritis.  Patient's pain improved following Lidoderm patch and Tylenol, she is appropriate for discharge home with outpatient orthopedic follow-up.  She was counseled to return to the ED for new or worsening symptoms, patient agrees to plan.      FINAL CLINICAL IMPRESSION(S) / ED DIAGNOSES   Final diagnoses:  Acute pain of left shoulder     Rx / DC Orders   ED Discharge Orders          Ordered    lidocaine (LIDODERM) 5 %  Every 12 hours        11/21/23 1721             Note:  This document was prepared using Dragon voice recognition software and may include unintentional dictation errors.   Chesley Noon, MD 11/21/23 [phone]

## 2023-12-25 NOTE — Progress Notes (Signed)
 Ellouise Console, PA-C [address]  [address]  [place], [place] [address]  Main: [phone]  Fax: [phone]   Primary Care Physician: Rollene Therisa BRAVO, FNP (Inactive)  Primary Gastroenterologist:  Ellouise Console, PA-C / Dr. Ruel Therisa    CC:  Repeat Colonscopy; Hx Colon Polyps  HPI: Brittany Sherman is a 78 y.o. female presents to schedule repeat surveillance colonoscopy.  07/2020: Last colonoscopy by Dr. Therisa: Multiple adenomatous polyps removed. Good prep.  3 year repeat.  Patient denies any GI symptoms such as abdominal pain, heartburn, dysphagia, diarrhea, constipation, or rectal bleeding.  She Has history of seizure disorder controlled on medication.  Last seizure was over 15 years ago.  She was having shortness of breath several months ago.  She followed up with cardiologist Dr. Gollan 07/2023 and was doing well.  She received cardiac clearance to undergo repeat colonoscopy.  Currently she denies any shortness of breath, chest pain, or other health symptoms.  Takes 81 mg aspirin daily.  No other blood thinners.  Diabetes controlled on medication and insulin .   Current Outpatient Medications  Medication Sig Dispense Refill   amLODipine  (NORVASC ) 5 MG tablet Take 1 tablet (5 mg total) by mouth daily. For further refills needs to see provider. 30 tablet 0   ASPIRIN LOW DOSE 81 MG tablet Take 81 mg by mouth daily.     brimonidine (ALPHAGAN) 0.2 % ophthalmic solution 1 drop 2 (two) times daily.     CALCIUM  600+D 600-10 MG-MCG TABS Take 1 tablet by mouth 2 (two) times daily.     divalproex  (DEPAKOTE ) 250 MG DR tablet Take 250 mg by mouth 2 (two) times daily.     gatifloxacin (ZYMAXID) 0.5 % SOLN Place 1 drop into the left eye 4 (four) times daily.     glimepiride  (AMARYL ) 4 MG tablet Take 1 tablet by mouth twice daily 180 tablet 0   hydrOXYzine  (ATARAX /VISTARIL ) 25 MG tablet Take 1 tablet (25 mg total) by mouth every 6 (six) hours as needed for itching. 30 tablet 0   ibuprofen   (ADVIL ) 600 MG tablet Take 1 tablet (600 mg total) by mouth every 8 (eight) hours as needed for moderate pain or headache. 20 tablet 0   insulin  degludec (TRESIBA  FLEXTOUCH) 100 UNIT/ML SOPN FlexTouch Pen Inject 0.3 mLs (30 Units total) into the skin daily at 10 pm. 3 mL 3   Insulin  Pen Needle (PEN NEEDLES) 32G X 6 MM MISC 1 each by Does not apply route daily. 100 each 12   lamoTRIgine  (LAMICTAL ) 150 MG tablet Take 150 mg by mouth daily.     LANTUS  SOLOSTAR 100 UNIT/ML Solostar Pen Inject 32 Units into the skin at bedtime.     lidocaine  (LIDODERM ) 5 % Place 1 patch onto the skin every 12 (twelve) hours. Remove & Discard patch within 12 hours or as directed by MD 10 patch 0   lisinopril-hydrochlorothiazide  (ZESTORETIC) 20-25 MG tablet Take 1 tablet by mouth daily.     magnesium oxide (MAG-OX) 400 MG tablet Take 1 tablet by mouth 2 (two) times daily.     Omega-3 Fatty Acids (FISH OIL) 1000 MG CAPS Take by mouth in the morning and at bedtime.     OZEMPIC, 0.25 OR 0.5 MG/DOSE, 2 MG/3ML SOPN Inject 0.5 mg into the skin once a week.     polyethylene glycol-electrolytes (NULYTELY ) 420 g solution Take 4,000 mLs by mouth once for 1 dose. Use as directed for your colonoscopy preparation 4000 mL 0  Vitamin D , Ergocalciferol , (DRISDOL ) 50000 units CAPS capsule Take 1 capsule (50,000 Units total) by mouth every 7 (seven) days. 12 capsule 3   No current facility-administered medications for this visit.    Allergies as of 12/26/2023 - Review Complete 12/26/2023  Allergen Reaction Noted   Aspirin  12/11/2015   Metformin  and related Diarrhea 04/02/2017   Penicillins Rash and Other (See Comments) 12/11/2015    Past Medical History:  Diagnosis Date   Arthritis    Benign neoplasm of rectum and anal canal    Diabetes mellitus without complication (HCC)    History of vertigo    Hypertension    Immune deficiency disorder (HCC)    Infectious colitis, enteritis and gastroenteritis    Partial epilepsy with  impairment of consciousness (HCC)    Seizures (HCC)    Stroke (HCC)    Trigger finger of right thumb    Vitamin D  deficiency     Past Surgical History:  Procedure Laterality Date   ABDOMINAL HYSTERECTOMY     BACK SURGERY     COLONOSCOPY WITH PROPOFOL  N/A 07/24/2017   Procedure: COLONOSCOPY WITH PROPOFOL ;  Surgeon: Therisa Bi, MD;  Location: [facility] ENDOSCOPY;  Service: Endoscopy;  Laterality: N/A;   COLONOSCOPY WITH PROPOFOL  N/A 08/03/2020   Procedure: COLONOSCOPY WITH PROPOFOL ;  Surgeon: Therisa Bi, MD;  Location: [facility] ENDOSCOPY;  Service: Gastroenterology;  Laterality: N/A;   OOPHORECTOMY     SPINE SURGERY     TOTAL HIP ARTHROPLASTY  2017    Review of Systems:    All systems reviewed and negative except where noted in HPI.   Physical Examination:   BP (!) 140/74   Pulse 72   Temp 97.6 F (36.4 C)   Ht 5' 5 (1.651 m)   Wt 180 lb 9.6 oz (81.9 kg)   BMI 30.05 kg/m   General: Well-nourished, well-developed in no acute distress.  Lungs: Clear to auscultation bilaterally. Non-labored. Heart: Regular rate and rhythm, no murmurs rubs or gallops.  Abdomen: Bowel sounds are normal; Abdomen is Soft; No hepatosplenomegaly, masses or hernias;  No Abdominal Tenderness; No guarding or rebound tenderness. Neuro: Alert and oriented x 3.  Grossly intact.  Psych: Alert and cooperative, normal mood and affect.   Imaging Studies: No results found.  Assessment and Plan:   Brittany Sherman is a 78 y.o. y/o female returns for follow-up of:  1.  History of adenomatous colon polyps  Scheduling Colonoscopy I discussed risks of colonoscopy with patient to include risk of bleeding, colon perforation, and risk of sedation.  Patient expressed understanding and agrees to proceed with colonoscopy.   2.  She takes 81 mg aspirin daily.  She has received cardiac clearance from Dr. Gollan to undergo colonoscopy procedure.  No current symptoms.  Hx Seizures controlled on medication.  Last seizure 15  years ago.  Hx Diabetes controlled on medication and insulin .  Ellouise Console, PA-C  Follow up as needed based on colonoscopy results.

## 2023-12-26 ENCOUNTER — Ambulatory Visit: Payer: Medicare Other | Admitting: Physician Assistant

## 2023-12-26 ENCOUNTER — Encounter: Payer: Self-pay | Admitting: Physician Assistant

## 2023-12-26 VITALS — BP 140/74 | HR 72 | Temp 97.6°F | Ht 65.0 in | Wt 180.6 lb

## 2023-12-26 DIAGNOSIS — Z09 Encounter for follow-up examination after completed treatment for conditions other than malignant neoplasm: Secondary | ICD-10-CM | POA: Diagnosis not present

## 2023-12-26 DIAGNOSIS — Z860101 Personal history of adenomatous and serrated colon polyps: Secondary | ICD-10-CM

## 2023-12-26 DIAGNOSIS — Z860100 Personal history of colon polyps, unspecified: Secondary | ICD-10-CM

## 2023-12-26 MED ORDER — PEG 3350-KCL-NA BICARB-NACL 420 G PO SOLR: 4000.0000 mL | Freq: Once | ORAL | 0 refills | Status: AC

## 2024-01-29 ENCOUNTER — Ambulatory Visit
Admission: RE | Admit: 2024-01-29 | Discharge: 2024-01-29 | Disposition: A | Discharge status: Home / Self Care | Payer: Medicare Other | Attending: Gastroenterology | Admitting: Gastroenterology

## 2024-01-29 ENCOUNTER — Ambulatory Visit: Payer: Medicare Other | Admitting: Certified Registered Nurse Anesthetist

## 2024-01-29 ENCOUNTER — Encounter: Payer: Self-pay | Admitting: Gastroenterology

## 2024-01-29 ENCOUNTER — Encounter
Admission: RE | Disposition: A | Discharge status: Home / Self Care | Payer: Self-pay | Source: Home / Self Care | Attending: Gastroenterology

## 2024-01-29 ENCOUNTER — Other Ambulatory Visit: Payer: Self-pay

## 2024-01-29 DIAGNOSIS — Z8673 Personal history of transient ischemic attack (TIA), and cerebral infarction without residual deficits: Secondary | ICD-10-CM | POA: Diagnosis not present

## 2024-01-29 DIAGNOSIS — I10 Essential (primary) hypertension: Secondary | ICD-10-CM | POA: Diagnosis not present

## 2024-01-29 DIAGNOSIS — K635 Polyp of colon: Secondary | ICD-10-CM | POA: Insufficient documentation

## 2024-01-29 DIAGNOSIS — Z87891 Personal history of nicotine dependence: Secondary | ICD-10-CM | POA: Insufficient documentation

## 2024-01-29 DIAGNOSIS — D120 Benign neoplasm of cecum: Secondary | ICD-10-CM | POA: Diagnosis not present

## 2024-01-29 DIAGNOSIS — E119 Type 2 diabetes mellitus without complications: Secondary | ICD-10-CM | POA: Diagnosis not present

## 2024-01-29 DIAGNOSIS — Z794 Long term (current) use of insulin: Secondary | ICD-10-CM | POA: Insufficient documentation

## 2024-01-29 DIAGNOSIS — Z1211 Encounter for screening for malignant neoplasm of colon: Secondary | ICD-10-CM | POA: Diagnosis present

## 2024-01-29 DIAGNOSIS — Z7985 Long-term (current) use of injectable non-insulin antidiabetic drugs: Secondary | ICD-10-CM | POA: Insufficient documentation

## 2024-01-29 DIAGNOSIS — D122 Benign neoplasm of ascending colon: Secondary | ICD-10-CM | POA: Diagnosis not present

## 2024-01-29 DIAGNOSIS — R569 Unspecified convulsions: Secondary | ICD-10-CM | POA: Insufficient documentation

## 2024-01-29 DIAGNOSIS — D126 Benign neoplasm of colon, unspecified: Secondary | ICD-10-CM | POA: Diagnosis present

## 2024-01-29 DIAGNOSIS — Z860100 Personal history of colon polyps, unspecified: Secondary | ICD-10-CM

## 2024-01-29 HISTORY — PX: POLYPECTOMY: SHX5525

## 2024-01-29 HISTORY — PX: COLONOSCOPY WITH PROPOFOL: SHX5780

## 2024-01-29 LAB — GLUCOSE, CAPILLARY: Glucose-Capillary: 174 mg/dL — ABNORMAL HIGH (ref 70–99)

## 2024-01-29 SURGERY — COLONOSCOPY WITH PROPOFOL
Anesthesia: General

## 2024-01-29 MED ORDER — GLYCOPYRROLATE 0.2 MG/ML IJ SOLN: INTRAMUSCULAR | Status: DC | PRN

## 2024-01-29 MED ORDER — SODIUM CHLORIDE 0.9 % IV SOLN: INTRAVENOUS | Status: DC

## 2024-01-29 MED ORDER — PROPOFOL 500 MG/50ML IV EMUL
INTRAVENOUS | Status: DC | PRN
  Administered 2024-01-29: 160 ug/kg/min via INTRAVENOUS

## 2024-01-29 MED ORDER — PROPOFOL 10 MG/ML IV BOLUS
INTRAVENOUS | Status: DC | PRN
  Administered 2024-01-29: 10 mg via INTRAVENOUS
  Administered 2024-01-29: 60 mg via INTRAVENOUS

## 2024-01-29 NOTE — Anesthesia Procedure Notes (Signed)
Date/Time: 01/29/2024 12:12 PM  Performed by: Malva Cogan, CRNAPre-anesthesia Checklist: Patient identified, Emergency Drugs available, Suction available, Patient being monitored and Timeout performed Patient Re-evaluated:Patient Re-evaluated prior to induction Oxygen Delivery Method: Nasal cannula Induction Type: IV induction Placement Confirmation: CO2 detector and positive ETCO2

## 2024-01-29 NOTE — Anesthesia Postprocedure Evaluation (Signed)
Anesthesia Post Note  Patient: KATERRA INGMAN  Procedure(s) Performed: COLONOSCOPY WITH PROPOFOL POLYPECTOMY  Patient location during evaluation: Endoscopy Anesthesia Type: General Level of consciousness: awake and alert Pain management: pain level controlled Vital Signs Assessment: post-procedure vital signs reviewed and stable Respiratory status: spontaneous breathing, nonlabored ventilation, respiratory function stable and patient connected to nasal cannula oxygen Cardiovascular status: blood pressure returned to baseline and stable Postop Assessment: no apparent nausea or vomiting Anesthetic complications: no   No notable events documented.   Last Vitals:  Vitals:   01/29/24 1235 01/29/24 1240  BP: (!) 94/55 (!) 143/73  Pulse: 66 67  Resp: 15 15  Temp: (!) 35.2 C   SpO2: 98% 97%    Last Pain:  Vitals:   01/29/24 1235  TempSrc: Temporal  PainSc:                  Cleda Mccreedy Taziah Difatta

## 2024-01-29 NOTE — Op Note (Signed)
[facility]astroenterology Patient Name: Brittany Sherman Procedure Date: 01/29/2024 11:58 AM MRN: [id number] Account #: [id number] Date of Birth: [birth year] Admit Type: Outpatient Age: 78 Room: [facility] ENDO ROOM 2 Gender: Female Note Status: Finalized Instrument Name: Nelda Marseille [id number] Procedure:             Colonoscopy Indications:           Surveillance: Personal history of adenomatous polyps                         on last colonoscopy > 3 years ago Providers:             Wyline Mood MD, MD Referring MD:          Wyline Mood MD, MD (Referring MD) Medicines:             Propofol per Anesthesia, Monitored Anesthesia Care Complications:         No immediate complications. Procedure:             Pre-Anesthesia Assessment:                        - Prior to the procedure, a History and Physical was                         performed, and patient medications, allergies and                         sensitivities were reviewed. The patient's tolerance                         of previous anesthesia was reviewed.                        - The risks and benefits of the procedure and the                         sedation options and risks were discussed with the                         patient. All questions were answered and informed                         consent was obtained.                        - ASA Grade Assessment: II - A patient with mild                         systemic disease.                        After obtaining informed consent, the colonoscope was                         passed under direct vision. Throughout the procedure,                         the patient's blood pressure, pulse, and oxygen  saturations were monitored continuously. The                         Colonoscope was introduced through the anus and                         advanced to the the cecum, identified by the                         appendiceal orifice. The colonoscopy  was performed                         with ease. The patient tolerated the procedure well.                         The quality of the bowel preparation was excellent.                         The ileocecal valve, appendiceal orifice, and rectum                         were photographed. Findings:      The perianal and digital rectal examinations were normal.      Two sessile polyps were found in the sigmoid colon and cecum. The polyps       were 4 to 5 mm in size. These polyps were removed with a cold snare.       Resection and retrieval were complete.      Two sessile polyps were found in the ascending colon. The polyps were 4       to 5 mm in size. These polyps were removed with a cold snare. Resection       and retrieval were complete.      The exam was otherwise without abnormality on direct and retroflexion       views. Impression:            - Two 4 to 5 mm polyps in the sigmoid colon and in the                         cecum, removed with a cold snare. Resected and                         retrieved.                        - Two 4 to 5 mm polyps in the ascending colon, removed                         with a cold snare. Resected and retrieved.                        - The examination was otherwise normal on direct and                         retroflexion views. Recommendation:        - Discharge patient to home (with escort).                        -  Resume previous diet.                        - Continue present medications.                        - Await pathology results.                        - Repeat colonoscopy is not recommended due to current                         age (46 years or older) for surveillance. Procedure Code(s):     --- Professional ---                        [phone], Colonoscopy, flexible; with removal of                         tumor(s), polyp(s), or other lesion(s) by snare                         technique Diagnosis Code(s):     --- Professional ---                         Z86.010, Personal history of colonic polyps                        D12.5, Benign neoplasm of sigmoid colon                        D12.0, Benign neoplasm of cecum                        D12.2, Benign neoplasm of ascending colon CPT copyright 2022 American Medical Association. All rights reserved. The codes documented in this report are preliminary and upon coder review may  be revised to meet current compliance requirements. Wyline Mood, MD Wyline Mood MD, MD 01/29/2024 12:29:54 PM This report has been signed electronically. Number of Addenda: 0 Note Initiated On: 01/29/2024 11:58 AM Scope Withdrawal Time: 0 hours 9 minutes 54 seconds  Total Procedure Duration: 0 hours 12 minutes 56 seconds  Estimated Blood Loss:  Estimated blood loss: none.      [facility]

## 2024-01-29 NOTE — Anesthesia Preprocedure Evaluation (Signed)
Anesthesia Evaluation  Patient identified by MRN, date of birth, ID band Patient awake    Reviewed: Allergy & Precautions, NPO status , Patient's Chart, lab work & pertinent test results  History of Anesthesia Complications Negative for: history of anesthetic complications  Airway Mallampati: III  TM Distance: <3 FB Neck ROM: full    Dental  (+) Lower Dentures, Upper Dentures   Pulmonary neg shortness of breath, former smoker   Pulmonary exam normal        Cardiovascular Exercise Tolerance: Good hypertension, (-) angina Normal cardiovascular exam     Neuro/Psych Seizures -,  CVA  negative psych ROS   GI/Hepatic negative GI ROS, Neg liver ROS,,,  Endo/Other  negative endocrine ROSdiabetes    Renal/GU Renal disease  negative genitourinary   Musculoskeletal   Abdominal   Peds  Hematology negative hematology ROS (+)   Anesthesia Other Findings Past Medical History: No date: Arthritis No date: Benign neoplasm of rectum and anal canal No date: Diabetes mellitus without complication (HCC) No date: History of vertigo No date: Hypertension No date: Immune deficiency disorder (HCC) No date: Infectious colitis, enteritis and gastroenteritis No date: Partial epilepsy with impairment of consciousness (HCC) No date: Seizures (HCC) No date: Stroke (HCC) No date: Trigger finger of right thumb No date: Vitamin D deficiency  Past Surgical History: No date: ABDOMINAL HYSTERECTOMY No date: BACK SURGERY 07/24/2017: COLONOSCOPY WITH PROPOFOL; N/A     Comment:  Procedure: COLONOSCOPY WITH PROPOFOL;  Surgeon: Wyline Mood, MD;  Location: [facility] ENDOSCOPY;  Service:               Endoscopy;  Laterality: N/A; 08/03/2020: COLONOSCOPY WITH PROPOFOL; N/A     Comment:  Procedure: COLONOSCOPY WITH PROPOFOL;  Surgeon: Wyline Mood, MD;  Location: [facility] ENDOSCOPY;  Service:               Gastroenterology;   Laterality: N/A; No date: OOPHORECTOMY No date: SPINE SURGERY 2017: TOTAL HIP ARTHROPLASTY  BMI    Body Mass Index: 33.95 kg/m      Reproductive/Obstetrics negative OB ROS                             Anesthesia Physical Anesthesia Plan  ASA: 3  Anesthesia Plan: General   Post-op Pain Management:    Induction: Intravenous  PONV Risk Score and Plan: Propofol infusion and TIVA  Airway Management Planned: Natural Airway and Nasal Cannula  Additional Equipment:   Intra-op Plan:   Post-operative Plan:   Informed Consent: I have reviewed the patients History and Physical, chart, labs and discussed the procedure including the risks, benefits and alternatives for the proposed anesthesia with the patient or authorized representative who has indicated his/her understanding and acceptance.     Dental Advisory Given  Plan Discussed with: Anesthesiologist, CRNA and Surgeon  Anesthesia Plan Comments: (Patient consented for risks of anesthesia including but not limited to:  - adverse reactions to medications - risk of airway placement if required - damage to eyes, teeth, lips or other oral mucosa - nerve damage due to positioning  - sore throat or hoarseness - Damage to heart, brain, nerves, lungs, other parts of body or loss of life  Patient voiced understanding and assent.)       Anesthesia Quick Evaluation

## 2024-01-29 NOTE — H&P (Signed)
Wyline Mood, MD [address], [place], [place], [address] [address], [place], [place], [address] Phone: [phone]  Fax: [phone]  Primary Care Physician:  Lorn Junes, FNP (Inactive)   Pre-Procedure History & Physical: HPI:  MAZELLE HUEBERT is a 78 y.o. female is here for an colonoscopy.   Past Medical History:  Diagnosis Date   Arthritis    Benign neoplasm of rectum and anal canal    Diabetes mellitus without complication (HCC)    History of vertigo    Hypertension    Immune deficiency disorder (HCC)    Infectious colitis, enteritis and gastroenteritis    Partial epilepsy with impairment of consciousness (HCC)    Seizures (HCC)    Stroke (HCC)    Trigger finger of right thumb    Vitamin D deficiency     Past Surgical History:  Procedure Laterality Date   ABDOMINAL HYSTERECTOMY     BACK SURGERY     COLONOSCOPY WITH PROPOFOL N/A 07/24/2017   Procedure: COLONOSCOPY WITH PROPOFOL;  Surgeon: Wyline Mood, MD;  Location: [facility] ENDOSCOPY;  Service: Endoscopy;  Laterality: N/A;   COLONOSCOPY WITH PROPOFOL N/A 08/03/2020   Procedure: COLONOSCOPY WITH PROPOFOL;  Surgeon: Wyline Mood, MD;  Location: [facility] ENDOSCOPY;  Service: Gastroenterology;  Laterality: N/A;   OOPHORECTOMY     SPINE SURGERY     TOTAL HIP ARTHROPLASTY  2017    Prior to Admission medications   Medication Sig Start Date End Date Taking? Authorizing Provider  amLODipine (NORVASC) 5 MG tablet Take 1 tablet (5 mg total) by mouth daily. For further refills needs to see provider. 07/29/19   Cannady, Jolene T, NP  ASPIRIN LOW DOSE 81 MG tablet Take 81 mg by mouth daily.    [provider]  brimonidine (ALPHAGAN) 0.2 % ophthalmic solution 1 drop 2 (two) times daily. 10/21/19   [provider]  CALCIUM 600+D 600-10 MG-MCG TABS Take 1 tablet by mouth 2 (two) times daily. 10/15/23   [provider]  divalproex (DEPAKOTE) 250 MG DR tablet Take 250 mg by mouth 2 (two)  times daily. 09/16/15   [provider]  gatifloxacin (ZYMAXID) 0.5 % SOLN Place 1 drop into the left eye 4 (four) times daily. 11/21/22   [provider]  glimepiride (AMARYL) 4 MG tablet Take 1 tablet by mouth twice daily 04/23/19   Olevia Perches P, DO  hydrOXYzine (ATARAX/VISTARIL) 25 MG tablet Take 1 tablet (25 mg total) by mouth every 6 (six) hours as needed for itching. 06/09/20   Tommi Rumps, PA-C  ibuprofen (ADVIL) 600 MG tablet Take 1 tablet (600 mg total) by mouth every 8 (eight) hours as needed for moderate pain or headache. 11/25/21   Shaune Pollack, MD  insulin degludec (TRESIBA FLEXTOUCH) 100 UNIT/ML SOPN FlexTouch Pen Inject 0.3 mLs (30 Units total) into the skin daily at 10 pm. 07/18/18   Olevia Perches P, DO  Insulin Pen Needle (PEN NEEDLES) 32G X 6 MM MISC 1 each by Does not apply route daily. 07/18/18   Johnson, Megan P, DO  lamoTRIgine (LAMICTAL) 150 MG tablet Take 150 mg by mouth daily. 11/18/19   [provider]  LANTUS SOLOSTAR 100 UNIT/ML Solostar Pen Inject 32 Units into the skin at bedtime. 01/15/23   [provider]  lidocaine (LIDODERM) 5 % Place 1 patch onto the skin every 12 (twelve) hours. Remove & Discard patch within 12 hours or as directed by MD 11/21/23 11/20/24  Chesley Noon,  MD  lisinopril-hydrochlorothiazide (ZESTORETIC) 20-25 MG tablet Take 1 tablet by mouth daily.    [provider]  magnesium oxide (MAG-OX) 400 MG tablet Take 1 tablet by mouth 2 (two) times daily. 04/26/23   [provider]  Omega-3 Fatty Acids (FISH OIL) 1000 MG CAPS Take by mouth in the morning and at bedtime.    [provider]  OZEMPIC, 0.25 OR 0.5 MG/DOSE, 2 MG/3ML SOPN Inject 0.5 mg into the skin once a week. 02/15/23   [provider]  Vitamin D, Ergocalciferol, (DRISDOL) 50000 units CAPS capsule Take 1 capsule (50,000 Units total) by mouth every 7 (seven) days. 07/19/18   Particia Nearing, PA-C    Allergies as of  12/26/2023 - Review Complete 12/26/2023  Allergen Reaction Noted   Aspirin  12/11/2015   Metformin and related Diarrhea 04/02/2017   Penicillins Rash and Other (See Comments) 12/11/2015    Family History  Problem Relation Age of Onset   Brain cancer Mother    Esophageal cancer Sister    Aneurysm Sister    Cancer Sister    Breast cancer Neg Hx     Social History   Socioeconomic History   Marital status: Widowed    Spouse name: Not on file   Number of children: Not on file   Years of education: Not on file   Highest education level: 8th grade  Occupational History   Not on file  Tobacco Use   Smoking status: Former    Current packs/day: 0.00    Average packs/day: 1.5 packs/day for 43.0 years (64.5 ttl pk-yrs)    Types: Cigarettes    Start date: 12/19/1963    Quit date: 12/18/2006    Years since quitting: 17.1   Smokeless tobacco: Never  Vaping Use   Vaping status: Never Used  Substance and Sexual Activity   Alcohol use: No   Drug use: No   Sexual activity: Never  Other Topics Concern   Not on file  Social History Narrative   Not on file   Social Drivers of Health   Financial Resource Strain: Low Risk  (07/18/2018)   Overall Financial Resource Strain (CARDIA)    Difficulty of Paying Living Expenses: Not very hard  Food Insecurity: No Food Insecurity (07/18/2018)   Hunger Vital Sign    Worried About Running Out of Food in the Last Year: Never true    Ran Out of Food in the Last Year: Never true  Transportation Needs: No Transportation Needs (07/18/2018)   PRAPARE - Administrator, Civil Service (Medical): No    Lack of Transportation (Non-Medical): No  Physical Activity: Inactive (07/18/2018)   Exercise Vital Sign    Days of Exercise per Week: 0 days    Minutes of Exercise per Session: 0 min  Stress: No Stress Concern Present (07/18/2018)   [employer] of Occupational Health - Occupational Stress Questionnaire    Feeling of Stress : Not at all  Social  Connections: Moderately Integrated (07/18/2018)   Social Connection and Isolation Panel [NHANES]    Frequency of Communication with Friends and Family: More than three times a week    Frequency of Social Gatherings with Friends and Family: More than three times a week    Attends Religious Services: More than 4 times per year    Active Member of [employer] or Organizations: No    Attends Banker Meetings: Never    Marital Status: Married  Catering manager Violence: Not At  Risk (07/18/2018)   Humiliation, Afraid, Rape, and Kick questionnaire    Fear of Current or Ex-Partner: No    Emotionally Abused: No    Physically Abused: No    Sexually Abused: No    Review of Systems: See HPI, otherwise negative ROS  Physical Exam: There were no vitals taken for this visit. General:   Alert,  pleasant and cooperative in NAD Head:  Normocephalic and atraumatic. Neck:  Supple; no masses or thyromegaly. Lungs:  Clear throughout to auscultation, normal respiratory effort.    Heart:  +S1, +S2, Regular rate and rhythm, No edema. Abdomen:  Soft, nontender and nondistended. Normal bowel sounds, without guarding, and without rebound.   Neurologic:  Alert and  oriented x4;  grossly normal neurologically.  Impression/Plan: MINHA FULCO is here for an colonoscopy to be performed for surveillance due to prior history of colon polyps   Risks, benefits, limitations, and alternatives regarding  colonoscopy have been reviewed with the patient.  Questions have been answered.  All parties agreeable.   Wyline Mood, MD  01/29/2024, 11:29 AM

## 2024-01-29 NOTE — Transfer of Care (Signed)
Immediate Anesthesia Transfer of Care Note  Patient: Brittany Sherman  Procedure(s) Performed: COLONOSCOPY WITH PROPOFOL POLYPECTOMY  Patient Location: PACU  Anesthesia Type:General  Level of Consciousness: drowsy  Airway & Oxygen Therapy: Patient Spontanous Breathing  Post-op Assessment: Report given to RN and Post -op Vital signs reviewed and stable  Post vital signs: Reviewed and stable  Last Vitals:  Vitals Value Taken Time  BP 96/65   Temp    Pulse 66 01/29/24 1233  Resp 15 01/29/24 1233  SpO2 98 % 01/29/24 1233  Vitals shown include unfiled device data.  Last Pain:  Vitals:   01/29/24 1201  TempSrc: Temporal  PainSc: 0-No pain         Complications: No notable events documented.

## 2024-01-30 ENCOUNTER — Encounter: Payer: Self-pay | Admitting: Gastroenterology

## 2024-01-30 LAB — SURGICAL PATHOLOGY

## 2024-02-01 ENCOUNTER — Encounter: Payer: Self-pay | Admitting: Gastroenterology

## 2024-05-15 ENCOUNTER — Inpatient Hospital Stay
Admission: EM | Admit: 2024-05-15 | Discharge: 2024-05-17 | DRG: 394 | Disposition: A | Discharge status: Home / Self Care | Attending: Osteopathic Medicine | Admitting: Osteopathic Medicine

## 2024-05-15 ENCOUNTER — Other Ambulatory Visit: Payer: Self-pay

## 2024-05-15 DIAGNOSIS — Z96642 Presence of left artificial hip joint: Secondary | ICD-10-CM | POA: Diagnosis present

## 2024-05-15 DIAGNOSIS — Z7982 Long term (current) use of aspirin: Secondary | ICD-10-CM

## 2024-05-15 DIAGNOSIS — Z87891 Personal history of nicotine dependence: Secondary | ICD-10-CM

## 2024-05-15 DIAGNOSIS — Z880 Allergy status to penicillin: Secondary | ICD-10-CM

## 2024-05-15 DIAGNOSIS — Z860100 Personal history of colon polyps, unspecified: Secondary | ICD-10-CM

## 2024-05-15 DIAGNOSIS — Z6828 Body mass index (BMI) 28.0-28.9, adult: Secondary | ICD-10-CM

## 2024-05-15 DIAGNOSIS — Z90710 Acquired absence of both cervix and uterus: Secondary | ICD-10-CM

## 2024-05-15 DIAGNOSIS — K648 Other hemorrhoids: Secondary | ICD-10-CM | POA: Diagnosis not present

## 2024-05-15 DIAGNOSIS — I10 Essential (primary) hypertension: Secondary | ICD-10-CM | POA: Diagnosis present

## 2024-05-15 DIAGNOSIS — Z800 Family history of malignant neoplasm of digestive organs: Secondary | ICD-10-CM

## 2024-05-15 DIAGNOSIS — E1165 Type 2 diabetes mellitus with hyperglycemia: Secondary | ICD-10-CM | POA: Diagnosis present

## 2024-05-15 DIAGNOSIS — K922 Gastrointestinal hemorrhage, unspecified: Secondary | ICD-10-CM | POA: Diagnosis present

## 2024-05-15 DIAGNOSIS — Z7984 Long term (current) use of oral hypoglycemic drugs: Secondary | ICD-10-CM

## 2024-05-15 DIAGNOSIS — E785 Hyperlipidemia, unspecified: Secondary | ICD-10-CM | POA: Diagnosis present

## 2024-05-15 DIAGNOSIS — K625 Hemorrhage of anus and rectum: Secondary | ICD-10-CM | POA: Diagnosis not present

## 2024-05-15 DIAGNOSIS — N179 Acute kidney failure, unspecified: Secondary | ICD-10-CM | POA: Diagnosis present

## 2024-05-15 DIAGNOSIS — Z79899 Other long term (current) drug therapy: Secondary | ICD-10-CM

## 2024-05-15 DIAGNOSIS — E860 Dehydration: Secondary | ICD-10-CM | POA: Diagnosis present

## 2024-05-15 DIAGNOSIS — Z8673 Personal history of transient ischemic attack (TIA), and cerebral infarction without residual deficits: Secondary | ICD-10-CM

## 2024-05-15 DIAGNOSIS — D122 Benign neoplasm of ascending colon: Secondary | ICD-10-CM | POA: Diagnosis present

## 2024-05-15 DIAGNOSIS — R636 Underweight: Secondary | ICD-10-CM | POA: Diagnosis present

## 2024-05-15 DIAGNOSIS — Z794 Long term (current) use of insulin: Secondary | ICD-10-CM

## 2024-05-15 DIAGNOSIS — R739 Hyperglycemia, unspecified: Secondary | ICD-10-CM

## 2024-05-15 DIAGNOSIS — G40909 Epilepsy, unspecified, not intractable, without status epilepticus: Secondary | ICD-10-CM

## 2024-05-15 DIAGNOSIS — Z808 Family history of malignant neoplasm of other organs or systems: Secondary | ICD-10-CM

## 2024-05-15 LAB — COMPREHENSIVE METABOLIC PANEL WITH GFR
ALT: 9 U/L (ref 0–44)
AST: 14 U/L — ABNORMAL LOW (ref 15–41)
Albumin: 3.7 g/dL (ref 3.5–5.0)
Alkaline Phosphatase: 56 U/L (ref 38–126)
Anion gap: 10 (ref 5–15)
BUN: 37 mg/dL — ABNORMAL HIGH (ref 8–23)
CO2: 26 mmol/L (ref 22–32)
Calcium: 9.2 mg/dL (ref 8.9–10.3)
Chloride: 97 mmol/L — ABNORMAL LOW (ref 98–111)
Creatinine, Ser: 1.43 mg/dL — ABNORMAL HIGH (ref 0.44–1.00)
GFR, Estimated: 38 mL/min — ABNORMAL LOW (ref >60)
Glucose, Bld: 391 mg/dL — ABNORMAL HIGH (ref 70–99)
Potassium: 4.6 mmol/L (ref 3.5–5.1)
Sodium: 133 mmol/L — ABNORMAL LOW (ref 135–145)
Total Bilirubin: 0.7 mg/dL (ref 0.0–1.2)
Total Protein: 7.3 g/dL (ref 6.5–8.1)

## 2024-05-15 LAB — TYPE AND SCREEN
ABO/RH(D): B POS
Antibody Screen: NEGATIVE

## 2024-05-15 LAB — CBC
HCT: 37.5 % (ref 36.0–46.0)
Hemoglobin: 12.3 g/dL (ref 12.0–15.0)
MCH: 26.8 pg (ref 26.0–34.0)
MCHC: 32.8 g/dL (ref 30.0–36.0)
MCV: 81.7 fL (ref 80.0–100.0)
Platelets: 235 10*3/uL (ref 150–400)
RBC: 4.59 MIL/uL (ref 3.87–5.11)
RDW: 15.4 % (ref 11.5–15.5)
WBC: 5.8 10*3/uL (ref 4.0–10.5)
nRBC: 0 % (ref 0.0–0.2)

## 2024-05-15 NOTE — ED Triage Notes (Signed)
 Pt to ED via POV c/o blood in stool. Pt reports about 2 hrs ago was using the bathroom and saw bright red blood in toilet. Denies any abdominal pain, nausea, or vomiting. Denies taking any blood thinners. Hx of HTN, diabetes

## 2024-05-15 NOTE — ED Provider Notes (Signed)
 [facility] Provider Note    Event Date/Time   First MD Initiated Contact with Patient 05/15/24 2355     (approximate)   History   Rectal Bleeding   HPI  Brittany Sherman is a 78 y.o. female with history of hypertension, seizures, diabetes, benign neoplasm of the rectum who presents to the emergency department with rectal bleeding.  She reports 2 episodes of large amount of bright red blood at home.  No melena.  No vomiting.  No abdominal pain.  No chest pain, shortness of breath or dizziness.  Not on blood thinners.  States her last colonoscopy was a year ago.  Denies prior history of GI bleed.   History provided by patient, family.    Past Medical History:  Diagnosis Date   Arthritis    Benign neoplasm of rectum and anal canal    Diabetes mellitus without complication (HCC)    History of vertigo    Hypertension    Immune deficiency disorder (HCC)    Infectious colitis, enteritis and gastroenteritis    Partial epilepsy with impairment of consciousness (HCC)    Seizures (HCC)    Stroke (HCC)    Trigger finger of right thumb    Vitamin D  deficiency     Past Surgical History:  Procedure Laterality Date   ABDOMINAL HYSTERECTOMY     BACK SURGERY     COLONOSCOPY WITH PROPOFOL  N/A 07/24/2017   Procedure: COLONOSCOPY WITH PROPOFOL ;  Surgeon: Luke Salaam, MD;  Location: [facility] ENDOSCOPY;  Service: Endoscopy;  Laterality: N/A;   COLONOSCOPY WITH PROPOFOL  N/A 08/03/2020   Procedure: COLONOSCOPY WITH PROPOFOL ;  Surgeon: Luke Salaam, MD;  Location: [facility] ENDOSCOPY;  Service: Gastroenterology;  Laterality: N/A;   COLONOSCOPY WITH PROPOFOL  N/A 01/29/2024   Procedure: COLONOSCOPY WITH PROPOFOL ;  Surgeon: Luke Salaam, MD;  Location: [facility] ENDOSCOPY;  Service: Gastroenterology;  Laterality: N/A;   OOPHORECTOMY     POLYPECTOMY  01/29/2024   Procedure: POLYPECTOMY;  Surgeon: Luke Salaam, MD;  Location: [facility] ENDOSCOPY;  Service: Gastroenterology;;   SPINE SURGERY      TOTAL HIP ARTHROPLASTY  2017    MEDICATIONS:  Prior to Admission medications   Medication Sig Start Date End Date Taking? Authorizing Provider  amLODipine  (NORVASC ) 5 MG tablet Take 1 tablet (5 mg total) by mouth daily. For further refills needs to see provider. 07/29/19   Cannady, Jolene T, NP  ASPIRIN LOW DOSE 81 MG tablet Take 81 mg by mouth daily.    [provider]  brimonidine (ALPHAGAN) 0.2 % ophthalmic solution 1 drop 2 (two) times daily. 10/21/19   [provider]  CALCIUM  600+D 600-10 MG-MCG TABS Take 1 tablet by mouth 2 (two) times daily. 10/15/23   [provider]  divalproex  (DEPAKOTE ) 250 MG DR tablet Take 250 mg by mouth 2 (two) times daily. 09/16/15   [provider]  gatifloxacin (ZYMAXID) 0.5 % SOLN Place 1 drop into the left eye 4 (four) times daily. 11/21/22   [provider]  glimepiride  (AMARYL ) 4 MG tablet Take 1 tablet by mouth twice daily 04/23/19   Johnson, Megan P, DO  hydrOXYzine  (ATARAX /VISTARIL ) 25 MG tablet Take 1 tablet (25 mg total) by mouth every 6 (six) hours as needed for itching. 06/09/20   Stafford Eagles, PA-C  ibuprofen  (ADVIL ) 600 MG tablet Take 1 tablet (600 mg total) by mouth every 8 (eight) hours as needed for moderate pain or headache. 11/25/21   Loman Risk, MD  insulin  degludec (TRESIBA   FLEXTOUCH) 100 UNIT/ML SOPN FlexTouch Pen Inject 0.3 mLs (30 Units total) into the skin daily at 10 pm. 07/18/18   Lincoln Renshaw, Megan P, DO  Insulin  Pen Needle (PEN NEEDLES) 32G X 6 MM MISC 1 each by Does not apply route daily. 07/18/18   Johnson, Megan P, DO  lamoTRIgine  (LAMICTAL ) 150 MG tablet Take 150 mg by mouth daily. 11/18/19   [provider]  LANTUS  SOLOSTAR 100 UNIT/ML Solostar Pen Inject 32 Units into the skin at bedtime. 01/15/23   [provider]  lidocaine  (LIDODERM ) 5 % Place 1 patch onto the skin every 12 (twelve) hours. Remove & Discard patch within 12 hours or as directed by MD 11/21/23 11/20/24   Twilla Galea, MD  lisinopril-hydrochlorothiazide  (ZESTORETIC) 20-25 MG tablet Take 1 tablet by mouth daily.    [provider]  magnesium oxide (MAG-OX) 400 MG tablet Take 1 tablet by mouth 2 (two) times daily. 04/26/23   [provider]  Omega-3 Fatty Acids (FISH OIL) 1000 MG CAPS Take by mouth in the morning and at bedtime.    [provider]  OZEMPIC, 0.25 OR 0.5 MG/DOSE, 2 MG/3ML SOPN Inject 0.5 mg into the skin once a week. 02/15/23   [provider]  Vitamin D , Ergocalciferol , (DRISDOL ) 50000 units CAPS capsule Take 1 capsule (50,000 Units total) by mouth every 7 (seven) days. 07/19/18   Corbin Dess, PA-C    Physical Exam   Triage Vital Signs: ED Triage Vitals  Encounter Vitals Group     BP 05/15/24 1926 (!) 150/71     Systolic BP Percentile --      Diastolic BP Percentile --      Pulse Rate 05/15/24 1926 72     Resp 05/15/24 1926 18     Temp 05/15/24 1926 98.1 F (36.7 C)     Temp Source 05/15/24 1926 Oral     SpO2 05/15/24 1926 98 %     Weight 05/15/24 1923 172 lb (78 kg)     Height 05/15/24 1923 5\' 5"  (1.651 m)     Head Circumference --      Peak Flow --      Pain Score 05/15/24 1923 0     Pain Loc --      Pain Education --      Exclude from Growth Chart --     Most recent vital signs: Vitals:   05/15/24 1926  BP: (!) 150/71  Pulse: 72  Resp: 18  Temp: 98.1 F (36.7 C)  SpO2: 98%    CONSTITUTIONAL: Alert, responds appropriately to questions. Well-appearing; well-nourished HEAD: Normocephalic, atraumatic EYES: Conjunctivae clear, pupils appear equal, sclera nonicteric ENT: normal nose; moist mucous membranes NECK: Supple, normal ROM CARD: RRR; S1 and S2 appreciated RESP: Normal chest excursion without splinting or tachypnea; breath sounds clear and equal bilaterally; no wheezes, no rhonchi, no rales, no hypoxia or respiratory distress, speaking full sentences ABD/GI: Non-distended; soft, non-tender, no rebound, no  guarding, no peritoneal signs RECTAL:  Normal rectal tone, patient had large amount of gross red blood on rectal exam.  No melena.  Guaiac positive.  No hemorrhoids appreciated, nontender rectal exam, no fecal impaction. BACK: The back appears normal EXT: Normal ROM in all joints; no deformity noted, no edema SKIN: Normal color for age and race; warm; no rash on exposed skin NEURO: Moves all extremities equally, normal speech PSYCH: The patient's mood and manner are appropriate.   ED Results / Procedures / Treatments   LABS: (  all labs ordered are listed, but only abnormal results are displayed) Labs Reviewed  COMPREHENSIVE METABOLIC PANEL WITH GFR - Abnormal; Notable for the following components:      Result Value   Sodium 133 (*)    Chloride 97 (*)    Glucose, Bld 391 (*)    BUN 37 (*)    Creatinine, Ser 1.43 (*)    AST 14 (*)    GFR, Estimated 38 (*)    All other components within normal limits  CBC  PROTIME-INR  POC OCCULT BLOOD, ED  TYPE AND SCREEN     EKG:   RADIOLOGY: My personal review and interpretation of imaging:    I have personally reviewed all radiology reports.   No results found.   PROCEDURES:  Critical Care performed: No      .1-3 Lead EKG Interpretation  Performed by: Vidyuth Belsito, Clover Dao, DO Authorized by: Jackeline Gutknecht, Clover Dao, DO     Interpretation: normal     ECG rate:  72   ECG rate assessment: normal     Rhythm: sinus rhythm     Ectopy: none     Conduction: normal       IMPRESSION / MDM / ASSESSMENT AND PLAN / ED COURSE  I reviewed the triage vital signs and the nursing notes.    Patient here with rectal bleeding.  Not on blood thinners.  Hemodynamically stable.  The patient is on the cardiac monitor to evaluate for evidence of arrhythmia and/or significant heart rate changes.   DIFFERENTIAL DIAGNOSIS (includes but not limited to):   Diverticular bleed, diverticulitis, colitis, anemia, constipation, malignancy   Patient's  presentation is most consistent with acute presentation with potential threat to life or bodily function.   PLAN: Patient's hemoglobin is 12.3.  Platelets of 235,000.  Will check INR.  Mild AKI with creatinine of 1.43.  Will give IV fluids.  Also hyperglycemic without DKA.  Hemodynamically stable but given large amount of blood on rectal exam, I recommended observation admission.  Patient comfortable with this plan.  Abdominal exam is benign.   MEDICATIONS GIVEN IN ED: Medications  0.9 %  sodium chloride  infusion ( Intravenous New Bag/Given 05/16/24 0023)     ED COURSE:  Consulted and discussed patient's case with hospitalist, Dr. Achilles Holes.  I have recommended admission and consulting physician agrees and will place admission orders.  Patient (and family if present) agree with this plan.   I reviewed all nursing notes, vitals, pertinent previous records.  All labs, EKGs, imaging ordered have been independently reviewed and interpreted by myself.      OUTSIDE RECORDS REVIEWED: Reviewed last colonoscopy with Dr. Sharren Decree on 01/29/2024.  Patient had polyps that were removed.       FINAL CLINICAL IMPRESSION(S) / ED DIAGNOSES   Final diagnoses:  Rectal bleeding  Hyperglycemia  AKI (acute kidney injury) (HCC)     Rx / DC Orders   ED Discharge Orders     None        Note:  This document was prepared using Dragon voice recognition software and may include unintentional dictation errors.   Rachelann Enloe, Clover Dao, DO 05/16/24 [phone]

## 2024-05-16 DIAGNOSIS — I10 Essential (primary) hypertension: Secondary | ICD-10-CM | POA: Diagnosis present

## 2024-05-16 DIAGNOSIS — Z79899 Other long term (current) drug therapy: Secondary | ICD-10-CM | POA: Diagnosis not present

## 2024-05-16 DIAGNOSIS — Z87891 Personal history of nicotine dependence: Secondary | ICD-10-CM | POA: Diagnosis not present

## 2024-05-16 DIAGNOSIS — K625 Hemorrhage of anus and rectum: Secondary | ICD-10-CM

## 2024-05-16 DIAGNOSIS — N179 Acute kidney failure, unspecified: Secondary | ICD-10-CM | POA: Diagnosis present

## 2024-05-16 DIAGNOSIS — Z794 Long term (current) use of insulin: Secondary | ICD-10-CM | POA: Diagnosis not present

## 2024-05-16 DIAGNOSIS — E860 Dehydration: Secondary | ICD-10-CM | POA: Diagnosis present

## 2024-05-16 DIAGNOSIS — Z860100 Personal history of colon polyps, unspecified: Secondary | ICD-10-CM | POA: Diagnosis not present

## 2024-05-16 DIAGNOSIS — E1165 Type 2 diabetes mellitus with hyperglycemia: Secondary | ICD-10-CM | POA: Diagnosis present

## 2024-05-16 DIAGNOSIS — E119 Type 2 diabetes mellitus without complications: Secondary | ICD-10-CM

## 2024-05-16 DIAGNOSIS — Z880 Allergy status to penicillin: Secondary | ICD-10-CM | POA: Diagnosis not present

## 2024-05-16 DIAGNOSIS — D122 Benign neoplasm of ascending colon: Secondary | ICD-10-CM | POA: Diagnosis present

## 2024-05-16 DIAGNOSIS — Z6828 Body mass index (BMI) 28.0-28.9, adult: Secondary | ICD-10-CM | POA: Diagnosis not present

## 2024-05-16 DIAGNOSIS — K922 Gastrointestinal hemorrhage, unspecified: Secondary | ICD-10-CM

## 2024-05-16 DIAGNOSIS — K648 Other hemorrhoids: Secondary | ICD-10-CM | POA: Diagnosis present

## 2024-05-16 DIAGNOSIS — E785 Hyperlipidemia, unspecified: Secondary | ICD-10-CM | POA: Diagnosis present

## 2024-05-16 DIAGNOSIS — Z96642 Presence of left artificial hip joint: Secondary | ICD-10-CM | POA: Diagnosis present

## 2024-05-16 DIAGNOSIS — G40909 Epilepsy, unspecified, not intractable, without status epilepticus: Secondary | ICD-10-CM | POA: Diagnosis not present

## 2024-05-16 DIAGNOSIS — Z90710 Acquired absence of both cervix and uterus: Secondary | ICD-10-CM | POA: Diagnosis not present

## 2024-05-16 DIAGNOSIS — R636 Underweight: Secondary | ICD-10-CM | POA: Diagnosis present

## 2024-05-16 DIAGNOSIS — Z7982 Long term (current) use of aspirin: Secondary | ICD-10-CM | POA: Diagnosis not present

## 2024-05-16 DIAGNOSIS — Z8673 Personal history of transient ischemic attack (TIA), and cerebral infarction without residual deficits: Secondary | ICD-10-CM | POA: Diagnosis not present

## 2024-05-16 DIAGNOSIS — Z800 Family history of malignant neoplasm of digestive organs: Secondary | ICD-10-CM | POA: Diagnosis not present

## 2024-05-16 DIAGNOSIS — Z808 Family history of malignant neoplasm of other organs or systems: Secondary | ICD-10-CM | POA: Diagnosis not present

## 2024-05-16 DIAGNOSIS — Z7984 Long term (current) use of oral hypoglycemic drugs: Secondary | ICD-10-CM | POA: Diagnosis not present

## 2024-05-16 LAB — CBC
HCT: 35.7 % — ABNORMAL LOW (ref 36.0–46.0)
Hemoglobin: 11.8 g/dL — ABNORMAL LOW (ref 12.0–15.0)
MCH: 26.5 pg (ref 26.0–34.0)
MCHC: 33.1 g/dL (ref 30.0–36.0)
MCV: 80.2 fL (ref 80.0–100.0)
Platelets: 208 10*3/uL (ref 150–400)
RBC: 4.45 MIL/uL (ref 3.87–5.11)
RDW: 15.2 % (ref 11.5–15.5)
WBC: 5.6 10*3/uL (ref 4.0–10.5)
nRBC: 0 % (ref 0.0–0.2)

## 2024-05-16 LAB — BASIC METABOLIC PANEL WITH GFR
Anion gap: 6 (ref 5–15)
BUN: 30 mg/dL — ABNORMAL HIGH (ref 8–23)
CO2: 29 mmol/L (ref 22–32)
Calcium: 9 mg/dL (ref 8.9–10.3)
Chloride: 101 mmol/L (ref 98–111)
Creatinine, Ser: 1.13 mg/dL — ABNORMAL HIGH (ref 0.44–1.00)
GFR, Estimated: 50 mL/min — ABNORMAL LOW (ref >60)
Glucose, Bld: 270 mg/dL — ABNORMAL HIGH (ref 70–99)
Potassium: 4.2 mmol/L (ref 3.5–5.1)
Sodium: 136 mmol/L (ref 135–145)

## 2024-05-16 LAB — GLUCOSE, CAPILLARY
Glucose-Capillary: 110 mg/dL — ABNORMAL HIGH (ref 70–99)
Glucose-Capillary: 147 mg/dL — ABNORMAL HIGH (ref 70–99)
Glucose-Capillary: 176 mg/dL — ABNORMAL HIGH (ref 70–99)
Glucose-Capillary: 260 mg/dL — ABNORMAL HIGH (ref 70–99)
Glucose-Capillary: 292 mg/dL — ABNORMAL HIGH (ref 70–99)
Glucose-Capillary: 52 mg/dL — ABNORMAL LOW (ref 70–99)
Glucose-Capillary: 70 mg/dL (ref 70–99)
Glucose-Capillary: 79 mg/dL (ref 70–99)
Glucose-Capillary: 83 mg/dL (ref 70–99)

## 2024-05-16 LAB — HEMOGLOBIN AND HEMATOCRIT, BLOOD
HCT: 35.2 % — ABNORMAL LOW (ref 36.0–46.0)
HCT: 36.1 % (ref 36.0–46.0)
HCT: 38.9 % (ref 36.0–46.0)
Hemoglobin: 11.5 g/dL — ABNORMAL LOW (ref 12.0–15.0)
Hemoglobin: 11.8 g/dL — ABNORMAL LOW (ref 12.0–15.0)
Hemoglobin: 12.8 g/dL (ref 12.0–15.0)

## 2024-05-16 LAB — PROTIME-INR
INR: 1 (ref 0.8–1.2)
INR: 1.1 (ref 0.8–1.2)
Prothrombin Time: 13.8 s (ref 11.4–15.2)
Prothrombin Time: 14.8 s (ref 11.4–15.2)

## 2024-05-16 LAB — HEMOGLOBIN A1C
Hgb A1c MFr Bld: 12.7 % — ABNORMAL HIGH (ref 4.8–5.6)
Mean Plasma Glucose: 317.79 mg/dL

## 2024-05-16 MED ORDER — INSULIN ASPART 100 UNIT/ML IJ SOLN: 0.0000 [IU] | Freq: Three times a day (TID) | INTRAMUSCULAR | Status: DC

## 2024-05-16 MED ORDER — INSULIN GLARGINE-YFGN 100 UNIT/ML ~~LOC~~ SOLN
12.0000 [IU] | Freq: Every day | SUBCUTANEOUS | Status: DC
  Filled 2024-05-16: qty 0.12

## 2024-05-16 MED ORDER — AMLODIPINE BESYLATE 5 MG PO TABS
5.0000 mg | ORAL_TABLET | Freq: Every day | ORAL | Status: DC
  Administered 2024-05-16: 5 mg via ORAL
  Filled 2024-05-16: qty 1

## 2024-05-16 MED ORDER — SODIUM CHLORIDE 0.9 % IV SOLN: INTRAVENOUS | Status: AC

## 2024-05-16 MED ORDER — EZETIMIBE 10 MG PO TABS
10.0000 mg | ORAL_TABLET | Freq: Every day | ORAL | Status: DC
  Administered 2024-05-16: 10 mg via ORAL
  Filled 2024-05-16 (×2): qty 1

## 2024-05-16 MED ORDER — INSULIN ASPART 100 UNIT/ML IJ SOLN: 0.0000 [IU] | Freq: Every day | INTRAMUSCULAR | Status: DC

## 2024-05-16 MED ORDER — LIVING WELL WITH DIABETES BOOK
Freq: Once | Status: AC
  Filled 2024-05-16: qty 1

## 2024-05-16 MED ORDER — LIDOCAINE 5 % EX PTCH
1.0000 | MEDICATED_PATCH | Freq: Two times a day (BID) | CUTANEOUS | Status: DC
  Administered 2024-05-16 (×2): 1 via TRANSDERMAL
  Filled 2024-05-16 (×3): qty 1

## 2024-05-16 MED ORDER — INSULIN ASPART 100 UNIT/ML IJ SOLN
0.0000 [IU] | INTRAMUSCULAR | Status: DC
  Administered 2024-05-16: 5 [IU] via SUBCUTANEOUS
  Administered 2024-05-16: 8 [IU] via SUBCUTANEOUS
  Filled 2024-05-16 (×2): qty 1

## 2024-05-16 MED ORDER — SODIUM CHLORIDE 0.9 % IV SOLN: INTRAVENOUS | Status: DC

## 2024-05-16 MED ORDER — GLIMEPIRIDE 4 MG PO TABS
4.0000 mg | ORAL_TABLET | Freq: Two times a day (BID) | ORAL | Status: DC
  Administered 2024-05-16: 4 mg via ORAL
  Filled 2024-05-16 (×2): qty 1

## 2024-05-16 MED ORDER — TRAZODONE HCL 50 MG PO TABS: 25.0000 mg | ORAL_TABLET | Freq: Every evening | ORAL | Status: DC | PRN

## 2024-05-16 MED ORDER — LAMOTRIGINE 100 MG PO TABS
150.0000 mg | ORAL_TABLET | Freq: Every day | ORAL | Status: DC
  Administered 2024-05-16: 150 mg via ORAL
  Filled 2024-05-16: qty 2

## 2024-05-16 MED ORDER — POLYETHYLENE GLYCOL 3350 17 GM/SCOOP PO POWD
238.0000 g | Freq: Once | ORAL | Status: AC
  Administered 2024-05-16: 238 g via ORAL
  Filled 2024-05-16: qty 238

## 2024-05-16 MED ORDER — ONDANSETRON HCL 4 MG PO TABS
4.0000 mg | ORAL_TABLET | Freq: Four times a day (QID) | ORAL | Status: DC | PRN
Start: 2024-05-16 — End: 2024-05-17

## 2024-05-16 MED ORDER — INSULIN GLARGINE 100 UNIT/ML ~~LOC~~ SOLN
16.0000 [IU] | Freq: Every day | SUBCUTANEOUS | Status: DC
  Filled 2024-05-16: qty 0.16

## 2024-05-16 MED ORDER — DIVALPROEX SODIUM 250 MG PO DR TAB
250.0000 mg | DELAYED_RELEASE_TABLET | Freq: Two times a day (BID) | ORAL | Status: DC
  Administered 2024-05-16 (×2): 250 mg via ORAL
  Filled 2024-05-16 (×3): qty 1

## 2024-05-16 MED ORDER — PANTOPRAZOLE SODIUM 40 MG IV SOLR
40.0000 mg | Freq: Every day | INTRAVENOUS | Status: DC
  Administered 2024-05-16: 40 mg via INTRAVENOUS
  Filled 2024-05-16: qty 10

## 2024-05-16 MED ORDER — OMEGA-3-ACID ETHYL ESTERS 1 G PO CAPS
1.0000 g | ORAL_CAPSULE | Freq: Two times a day (BID) | ORAL | Status: DC
  Administered 2024-05-16 (×2): 1 g via ORAL
  Filled 2024-05-16 (×2): qty 1

## 2024-05-16 MED ORDER — ONDANSETRON HCL 4 MG/2ML IJ SOLN
4.0000 mg | Freq: Four times a day (QID) | INTRAMUSCULAR | Status: DC | PRN
Start: 2024-05-16 — End: 2024-05-17

## 2024-05-16 MED ORDER — OYSTER SHELL CALCIUM/D3 500-5 MG-MCG PO TABS
1.0000 | ORAL_TABLET | Freq: Two times a day (BID) | ORAL | Status: DC
  Administered 2024-05-16 (×2): 1 via ORAL
  Filled 2024-05-16 (×2): qty 1

## 2024-05-16 MED ORDER — ROSUVASTATIN CALCIUM 10 MG PO TABS
5.0000 mg | ORAL_TABLET | Freq: Every day | ORAL | Status: DC
  Administered 2024-05-16: 5 mg via ORAL
  Filled 2024-05-16: qty 1

## 2024-05-16 NOTE — Assessment & Plan Note (Signed)
-   Okay to any antihypertensive therapy while holding off nephrotoxins.

## 2024-05-16 NOTE — Progress Notes (Signed)
  Brief Progress Note (See full H&P from earlier today)  Pt seen this morning, no additional bleeding or other concerns   Chart reviewed   Assessment/Plan changes or updates compared to H&P: GI bleeding - appreciate GI recs, anticipate colonoscopy tomorrow Continue monitor CBC CLD, NPO at midnight

## 2024-05-16 NOTE — TOC Initial Note (Signed)
 Transition of Care [facility]) - Initial/Assessment Note    Patient Details  Name: Brittany Sherman MRN: [id number] Date of Birth: 1946/11/08  Transition of Care [facility] CM/SW Contact:    Elsie Halo, RN Phone Number: 05/16/2024, 12:46 PM  Clinical Narrative:                  Patient lives independently at home. Her adult grandson also lives in the home. She drives and her daughter will transport her home at discharge. Her PCP was Dr. Nicolette Barrio, but it's Dr. Willene Harper now that Dr. Nicolette Barrio is gone. She uses the Pharmacy at [facility] clinical. No DME is used at this time.  Therapy evals are pending. TOC will continue to follow.   Barriers to Discharge: Continued Medical Work up   Patient Goals and CMS Choice            Expected Discharge Plan and Services   Discharge Planning Services: CM Consult   Living arrangements for the past 2 months: Single Family Home                                      Prior Living Arrangements/Services Living arrangements for the past 2 months: Single Family Home Lives with:: Other (Comment) (adult grandson lives with her)                   Activities of Daily Living      Permission Sought/Granted                  Emotional Assessment       Orientation: : Oriented to Self, Oriented to Place, Oriented to  Time, Oriented to Situation   Psych Involvement: No (comment)  Admission diagnosis:  Rectal bleeding [K62.5] Lower GI bleeding [K92.2] Hyperglycemia [R73.9] AKI (acute kidney injury) (HCC) [N17.9] Patient Active Problem List   Diagnosis Date Noted   Lower GI bleeding 05/16/2024   Dyslipidemia 05/16/2024   AKI (acute kidney injury) (HCC) 05/16/2024   Type 2 diabetes mellitus without complication, with long-term current use of insulin  (HCC) 05/16/2024   Essential hypertension 05/16/2024   Seizure disorder (HCC) 05/16/2024   History of colon polyps 01/29/2024   Seizures (HCC) 09/18/2022   Yeast infection 12/24/2018    Aortic atherosclerosis (HCC) 08/12/2018   Adrenal nodule (HCC) 08/12/2018   Baker's cyst of knee, left 08/13/2017   Traumatic avulsion of nail plate of finger 07/10/2017   Osteoarthritis 02/27/2017   CKD (chronic kidney disease), stage III (HCC) 02/27/2017   Hyperlipidemia associated with type 2 diabetes mellitus (HCC) 02/27/2017   Benign hypertensive renal disease    Uncontrolled type 2 diabetes with neuropathy    History of stroke without residual deficits    Vitamin D  deficiency    Trigger finger of right thumb    Adenomatous polyp of colon    Partial epilepsy with impairment of consciousness (HCC)    PCP:  Evelena Hines, FNP (Inactive) Pharmacy:   [facility] [address] (N),  - [address] [place] ([place]) [place] [address] Phone: [phone] Fax: [phone]  [facility] [place], [place] - [address] [place] [address] Phone: [phone] Fax: [phone]     Social Drivers of Health (SDOH) Social History: SDOH Screenings   Food Insecurity: No Food Insecurity (07/18/2018)  Transportation Needs: No Transportation Needs (07/18/2018)  Financial  Resource Strain: Low Risk  (07/18/2018)  Physical Activity: Inactive (07/18/2018)  Social Connections: Moderately Integrated (07/18/2018)  Stress: No Stress Concern Present (07/18/2018)  Tobacco Use: Medium Risk (05/15/2024)   SDOH Interventions:     Readmission Risk Interventions     No data to display

## 2024-05-16 NOTE — Consult Note (Signed)
 Marnee Sink, MD [facility]  [address] [place], [place] [address] Phone: [phone] Fax : [phone]  Consultation  Referring Provider:     Dr. Achilles Holes Primary Care Physician:  Evelena Hines, FNP (Inactive) Primary Gastroenterologist:  Dr. Antony Baumgartner         Reason for Consultation:     Rectal bleeding  Date of Admission:  05/15/2024 Date of Consultation:  05/16/2024         HPI:   Brittany Sherman is a 78 y.o. female who has a history of hypertension, seizures, diabetes and previous colonoscopies with colon polyps.  The patient has had multiple colonoscopies with the colonoscopy in 2018 showing 10 polyps.  Of those 10 polyps 6 of them were adenomatous polyps.  The patient then had a repeat colonoscopy in 2021 that showed 6 polyps with 1 polyp being 12 mm.  Of the 6 polyps 5 of them were adenomatous.  The patient then had a repeat colonoscopy in February of this year that showed 4 polyps which 3 of the polyps were adenomatous and the remaining 1 was hyperplastic. There is no report of the patient having diverticulosis in any of these reports and the patient also was found to have hemorrhoids at that time.  The patient had presented to the emergency room department with 2 episodes of large bright red blood at home.  There is no report of any abdominal pain shortness of breath dizziness nausea or vomiting.  It was reported that the patient had no clots with the rectal bleeding just bright red blood.  Component     Latest Ref Rng 11/02/2023 05/15/2024 05/16/2024  Hemoglobin     12.0 - 15.0 g/dL 69.6  29.5  28.4 (L)   Hemoglobin        11.8 (L)   HCT     36.0 - 46.0 % 41.5  37.5  35.2 (L)   HCT        35.7 (L)    The patient denies taking any anti-inflammatory medication.  She also states that her stools have been somewhat different with some round ball-like stools although she states she has not been constipated or pushing and straining to have a bowel movement.  Past Medical  History:  Diagnosis Date   Arthritis    Benign neoplasm of rectum and anal canal    Diabetes mellitus without complication (HCC)    History of vertigo    Hypertension    Immune deficiency disorder (HCC)    Infectious colitis, enteritis and gastroenteritis    Partial epilepsy with impairment of consciousness (HCC)    Seizures (HCC)    Stroke (HCC)    Trigger finger of right thumb    Vitamin D  deficiency     Past Surgical History:  Procedure Laterality Date   ABDOMINAL HYSTERECTOMY     BACK SURGERY     COLONOSCOPY WITH PROPOFOL  N/A 07/24/2017   Procedure: COLONOSCOPY WITH PROPOFOL ;  Surgeon: Luke Salaam, MD;  Location: [facility] ENDOSCOPY;  Service: Endoscopy;  Laterality: N/A;   COLONOSCOPY WITH PROPOFOL  N/A 08/03/2020   Procedure: COLONOSCOPY WITH PROPOFOL ;  Surgeon: Luke Salaam, MD;  Location: [facility] ENDOSCOPY;  Service: Gastroenterology;  Laterality: N/A;   COLONOSCOPY WITH PROPOFOL  N/A 01/29/2024   Procedure: COLONOSCOPY WITH PROPOFOL ;  Surgeon: Luke Salaam, MD;  Location: [facility] ENDOSCOPY;  Service: Gastroenterology;  Laterality: N/A;   OOPHORECTOMY     POLYPECTOMY  01/29/2024   Procedure: POLYPECTOMY;  Surgeon: Luke Salaam,  MD;  Location: [facility] ENDOSCOPY;  Service: Gastroenterology;;   SPINE SURGERY     TOTAL HIP ARTHROPLASTY  2017    Prior to Admission medications   Medication Sig Start Date End Date Taking? Authorizing Provider  amLODipine  (NORVASC ) 5 MG tablet Take 1 tablet (5 mg total) by mouth daily. For further refills needs to see provider. 07/29/19  Yes Cannady, Jolene T, NP  ASPIRIN LOW DOSE 81 MG tablet Take 81 mg by mouth daily.   Yes [provider]  CALCIUM  600+D 600-10 MG-MCG TABS Take 1 tablet by mouth 2 (two) times daily. 10/15/23  Yes [provider]  divalproex  (DEPAKOTE ) 250 MG DR tablet Take 250 mg by mouth 2 (two) times daily. 09/16/15  Yes [provider]  ezetimibe  (ZETIA ) 10 MG tablet Take 10 mg by mouth daily. 04/30/24  Yes [provider]  glimepiride  (AMARYL ) 4 MG tablet Take 1 tablet by mouth twice daily 04/23/19  Yes Johnson, Megan P, DO  ibuprofen  (ADVIL ) 600 MG tablet Take 1 tablet (600 mg total) by mouth every 8 (eight) hours as needed for moderate pain or headache. 11/25/21  Yes Loman Risk, MD  lamoTRIgine  (LAMICTAL ) 150 MG tablet Take 150 mg by mouth daily. 11/18/19  Yes [provider]  LANTUS  SOLOSTAR 100 UNIT/ML Solostar Pen Inject 32 Units into the skin at bedtime. 01/15/23  Yes [provider]  lidocaine  (LIDODERM ) 5 % Place 1 patch onto the skin every 12 (twelve) hours. Remove & Discard patch within 12 hours or as directed by MD 11/21/23 11/20/24 Yes Twilla Galea, MD  lisinopril-hydrochlorothiazide  (ZESTORETIC) 20-25 MG tablet Take 1 tablet by mouth daily.   Yes [provider]  magnesium oxide (MAG-OX) 400 MG tablet Take 1 tablet by mouth 2 (two) times daily. 04/26/23  Yes [provider]  Omega-3 Fatty Acids (FISH OIL) 1000 MG CAPS Take by mouth in the morning and at bedtime.   Yes [provider]  rosuvastatin  (CRESTOR ) 5 MG tablet Take 5 mg by mouth daily. 04/30/24  Yes [provider]  brimonidine (ALPHAGAN) 0.2 % ophthalmic solution 1 drop 2 (two) times daily. Patient not taking: Reported on 05/16/2024 10/21/19   [provider]  gatifloxacin (ZYMAXID) 0.5 % SOLN Place 1 drop into the left eye 4 (four) times daily. Patient not taking: Reported on 05/16/2024 11/21/22   [provider]  hydrOXYzine  (ATARAX /VISTARIL ) 25 MG tablet Take 1 tablet (25 mg total) by mouth every 6 (six) hours as needed for itching. Patient not taking: Reported on 05/16/2024 06/09/20   Stafford Eagles, PA-C  insulin  degludec (TRESIBA  FLEXTOUCH) 100 UNIT/ML SOPN FlexTouch Pen Inject 0.3 mLs (30 Units total) into the skin daily at 10 pm. Patient not taking: Reported on 05/16/2024 07/18/18   Terre Ferri P, DO  Insulin  Pen Needle (PEN NEEDLES) 32G X 6 MM MISC 1  each by Does not apply route daily. 07/18/18   Johnson, Megan P, DO  OZEMPIC, 0.25 OR 0.5 MG/DOSE, 2 MG/3ML SOPN Inject 0.5 mg into the skin once a week. Patient not taking: Reported on 05/16/2024 02/15/23   [provider]  Vitamin D , Ergocalciferol , (DRISDOL ) 50000 units CAPS capsule Take 1 capsule (50,000 Units total) by mouth every 7 (seven) days. Patient not taking: Reported on 05/16/2024 07/19/18   Corbin Dess, PA-C    Family History  Problem Relation Age of Onset   Brain cancer Mother    Esophageal cancer Sister    Aneurysm Sister  Cancer Sister    Breast cancer Neg Hx      Social History   Tobacco Use   Smoking status: Former    Current packs/day: 0.00    Average packs/day: 1.5 packs/day for 43.0 years (64.5 ttl pk-yrs)    Types: Cigarettes    Start date: 12/19/1963    Quit date: 12/18/2006    Years since quitting: 17.4   Smokeless tobacco: Never  Vaping Use   Vaping status: Never Used  Substance Use Topics   Alcohol use: No   Drug use: No    Allergies as of 05/15/2024 - Review Complete 05/15/2024  Allergen Reaction Noted   Aspirin  12/11/2015   Metformin  and related Diarrhea 04/02/2017   Penicillins Rash and Other (See Comments) 12/11/2015    Review of Systems:    All systems reviewed and negative except where noted in HPI.   Physical Exam:  Vital signs in last 24 hours: Temp:  [97.6 F (36.4 C)-98.1 F (36.7 C)] 97.9 F (36.6 C) (05/30 0846) Pulse Rate:  [58-72] 70 (05/30 0846) Resp:  [18] 18 (05/30 0846) BP: (128-173)/(67-90) 128/71 (05/30 0846) SpO2:  [95 %-100 %] 99 % (05/30 0846) Weight:  [78 kg] 78 kg (05/29 1923) Last BM Date : 05/15/24 General:   Pleasant, cooperative in NAD Head:  Normocephalic and atraumatic. Eyes:   No icterus.   Conjunctiva pink. PERRLA. Ears:  Normal auditory acuity. Neck:  Supple; no masses or thyroidomegaly Lungs: Respirations even and unlabored. Lungs clear to auscultation bilaterally.   No wheezes,  crackles, or rhonchi.  Heart:  Regular rate and rhythm;  Without murmur, clicks, rubs or gallops Abdomen:  Soft, nondistended, nontender. Normal bowel sounds. No appreciable masses or hepatomegaly.  No rebound or guarding.  Rectal:  Not performed. Msk:  Symmetrical without gross deformities.   Extremities:  Without edema, cyanosis or clubbing. Neurologic:  Alert and oriented x3;  grossly normal neurologically. Skin:  Intact without significant lesions or rashes. Cervical Nodes:  No significant cervical adenopathy. Psych:  Alert and cooperative. Normal affect.  LAB RESULTS: Recent Labs    05/15/24 1929 05/16/24 0230 05/16/24 0742  WBC 5.8 5.6  --   HGB 12.3 11.8* 11.5*  HCT 37.5 35.7* 35.2*  PLT 235 208  --    BMET Recent Labs    05/15/24 1929 05/16/24 0230  NA 133* 136  K 4.6 4.2  CL 97* 101  CO2 26 29  GLUCOSE 391* 270*  BUN 37* 30*  CREATININE 1.43* 1.13*  CALCIUM  9.2 9.0   LFT Recent Labs    05/15/24 1929  PROT 7.3  ALBUMIN 3.7  AST 14*  ALT 9  ALKPHOS 56  BILITOT 0.7   PT/INR Recent Labs    05/16/24 0008 05/16/24 0230  LABPROT 13.8 14.8  INR 1.0 1.1    STUDIES: No results found.    Impression / Plan:   Assessment: Principal Problem:   Lower GI bleeding Active Problems:   Dyslipidemia   AKI (acute kidney injury) (HCC)   Type 2 diabetes mellitus without complication, with long-term current use of insulin  (HCC)   Essential hypertension   Seizure disorder (HCC)   Brittany Sherman is a 78 y.o. y/o female with with rectal bleeding with 2 episodes of bright red blood per rectum.  The patient's rectal bleeding is likely hemorrhoidal since the patient had a colonoscopy back in February of this year.  The patient has been explained that with the history of polyps in the  report showing no sign of diverticulosis in the past and that she has not been on any NSAIDs the most likely cause would be hemorrhoids.  The patient states that she is still concerned  about something else causing the bleeding other than hemorrhoids and is quite concerned that something else may be going on and would like to proceed with a colonoscopy.  The patient has had no further bleeding since admission.  Plan:  The patient will be set up for a colonoscopy to rule out any other cause for rectal bleeding besides hemorrhoidal bleeding.  The patient will be set up for a colonoscopy with Dr. Mamie Searles for tomorrow.  She will be put on a clear liquid diet today and given a prep today for the colonoscopy for tomorrow.  The patient has been explained the plan and agrees with the.  Thank you for involving me in the care of this patient.      LOS: 0 days   Marnee Sink, MD, MD. Sylvan Evener 05/16/2024, 9:15 AM,  Pager [phone] 7am-5pm  Check AMION for 5pm -7am coverage and on weekends   Note: This dictation was prepared with Dragon dictation along with smaller phrase technology. Any transcriptional errors that result from this process are unintentional.

## 2024-05-16 NOTE — Assessment & Plan Note (Signed)
-   Will continue Zetia and statin therapy.

## 2024-05-16 NOTE — Assessment & Plan Note (Addendum)
-   The patient be admitted to a medical telemetry bed. - Will follow serial hemoglobins and hematocrits. - Will hold off aspirin. - GI consult will be obtained. -I notified Dr. Emerick Hanlon about the patient.

## 2024-05-16 NOTE — H&P (Addendum)
 [place]   PATIENT NAME: Brittany Sherman    MR#:  [id number]  DATE OF BIRTH:  29-Sep-1946  DATE OF ADMISSION:  05/15/2024  PRIMARY CARE PHYSICIAN: Evelena Hines, FNP (Inactive)   Patient is coming from: Home  REQUESTING/REFERRING PHYSICIAN: Ward, Clover Dao, DO  CHIEF COMPLAINT:   Chief Complaint  Patient presents with   Rectal Bleeding    HISTORY OF PRESENT ILLNESS:  Brittany Sherman is a 78 y.o. female with medical history significant for osteoarthritis, type 2 diabetes mellitus, hypertension, seizure disorder and CVA, presented to the emergency room with acute onset of bright red bleeding per rectum twice with no clots.  She denies any melena or nausea or vomiting or abdominal pain.  No chest pain or palpitations.  No cough or wheezing.  No fever or chills.  No dyspnea.  No dysuria, oliguria or hematuria or flank pain.  No other bleeding diathesis.  ED Course: When she came to the ER, BP was 150/71 with otherwise normal vitals.  Labs revealed a BUN of 37 with creatinine 1.43 with glucose of 391 and sodium 133.  CBC was within normal.  Blood group was B+ with negative antibody screen.  PT was 13.8 and INR 1. EKG as reviewed by me : None Imaging: None  The patient will be admitted to a medical telemetry bed for further evaluation and management. PAST MEDICAL HISTORY:   Past Medical History:  Diagnosis Date   Arthritis    Benign neoplasm of rectum and anal canal    Diabetes mellitus without complication (HCC)    History of vertigo    Hypertension    Immune deficiency disorder (HCC)    Infectious colitis, enteritis and gastroenteritis    Partial epilepsy with impairment of consciousness (HCC)    Seizures (HCC)    Stroke (HCC)    Trigger finger of right thumb    Vitamin D  deficiency     PAST SURGICAL HISTORY:   Past Surgical History:  Procedure Laterality Date   ABDOMINAL HYSTERECTOMY     BACK SURGERY     COLONOSCOPY WITH PROPOFOL  N/A 07/24/2017   Procedure:  COLONOSCOPY WITH PROPOFOL ;  Surgeon: Luke Salaam, MD;  Location: [facility] ENDOSCOPY;  Service: Endoscopy;  Laterality: N/A;   COLONOSCOPY WITH PROPOFOL  N/A 08/03/2020   Procedure: COLONOSCOPY WITH PROPOFOL ;  Surgeon: Luke Salaam, MD;  Location: [facility] ENDOSCOPY;  Service: Gastroenterology;  Laterality: N/A;   COLONOSCOPY WITH PROPOFOL  N/A 01/29/2024   Procedure: COLONOSCOPY WITH PROPOFOL ;  Surgeon: Luke Salaam, MD;  Location: [facility] ENDOSCOPY;  Service: Gastroenterology;  Laterality: N/A;   OOPHORECTOMY     POLYPECTOMY  01/29/2024   Procedure: POLYPECTOMY;  Surgeon: Luke Salaam, MD;  Location: [facility] ENDOSCOPY;  Service: Gastroenterology;;   SPINE SURGERY     TOTAL HIP ARTHROPLASTY  2017    SOCIAL HISTORY:   Social History   Tobacco Use   Smoking status: Former    Current packs/day: 0.00    Average packs/day: 1.5 packs/day for 43.0 years (64.5 ttl pk-yrs)    Types: Cigarettes    Start date: 12/19/1963    Quit date: 12/18/2006    Years since quitting: 17.4   Smokeless tobacco: Never  Substance Use Topics   Alcohol use: No    FAMILY HISTORY:   Family History  Problem Relation Age of Onset   Brain cancer Mother    Esophageal cancer Sister    Aneurysm Sister    Cancer Sister    Breast cancer  Neg Hx     DRUG ALLERGIES:   Allergies  Allergen Reactions   Aspirin     Stroke   Metformin  And Related Diarrhea   Penicillins Rash and Other (See Comments)    Has patient had a PCN reaction causing immediate rash, facial/tongue/throat swelling, SOB or lightheadedness with hypotension: Yes  Has patient had a PCN reaction causing severe rash involving mucus membranes or skin necrosis: No  Has patient had a PCN reaction that required hospitalization No  Has patient had a PCN reaction occurring within the last 10 years: Yes  If all of the above answers are "NO", then may proceed with Cephalosporin use.    REVIEW OF SYSTEMS:   ROS As per history of present illness. All pertinent systems were  reviewed above. Constitutional, HEENT, cardiovascular, respiratory, GI, GU, musculoskeletal, neuro, psychiatric, endocrine, integumentary and hematologic systems were reviewed and are otherwise negative/unremarkable except for positive findings mentioned above in the HPI.   MEDICATIONS AT HOME:   Prior to Admission medications   Medication Sig Start Date End Date Taking? Authorizing Provider  amLODipine  (NORVASC ) 5 MG tablet Take 1 tablet (5 mg total) by mouth daily. For further refills needs to see provider. 07/29/19  Yes Cannady, Jolene T, NP  ASPIRIN LOW DOSE 81 MG tablet Take 81 mg by mouth daily.   Yes [provider]  CALCIUM  600+D 600-10 MG-MCG TABS Take 1 tablet by mouth 2 (two) times daily. 10/15/23  Yes [provider]  divalproex  (DEPAKOTE ) 250 MG DR tablet Take 250 mg by mouth 2 (two) times daily. 09/16/15  Yes [provider]  ezetimibe  (ZETIA ) 10 MG tablet Take 10 mg by mouth daily. 04/30/24  Yes [provider]  glimepiride  (AMARYL ) 4 MG tablet Take 1 tablet by mouth twice daily 04/23/19  Yes Johnson, Megan P, DO  ibuprofen  (ADVIL ) 600 MG tablet Take 1 tablet (600 mg total) by mouth every 8 (eight) hours as needed for moderate pain or headache. 11/25/21  Yes Loman Risk, MD  lamoTRIgine  (LAMICTAL ) 150 MG tablet Take 150 mg by mouth daily. 11/18/19  Yes [provider]  LANTUS  SOLOSTAR 100 UNIT/ML Solostar Pen Inject 32 Units into the skin at bedtime. 01/15/23  Yes [provider]  lidocaine  (LIDODERM ) 5 % Place 1 patch onto the skin every 12 (twelve) hours. Remove & Discard patch within 12 hours or as directed by MD 11/21/23 11/20/24 Yes Twilla Galea, MD  lisinopril-hydrochlorothiazide  (ZESTORETIC) 20-25 MG tablet Take 1 tablet by mouth daily.   Yes [provider]  magnesium oxide (MAG-OX) 400 MG tablet Take 1 tablet by mouth 2 (two) times daily. 04/26/23  Yes [provider]  Omega-3 Fatty Acids (FISH OIL) 1000 MG  CAPS Take by mouth in the morning and at bedtime.   Yes [provider]  rosuvastatin  (CRESTOR ) 5 MG tablet Take 5 mg by mouth daily. 04/30/24  Yes [provider]  brimonidine (ALPHAGAN) 0.2 % ophthalmic solution 1 drop 2 (two) times daily. Patient not taking: Reported on 05/16/2024 10/21/19   [provider]  gatifloxacin (ZYMAXID) 0.5 % SOLN Place 1 drop into the left eye 4 (four) times daily. Patient not taking: Reported on 05/16/2024 11/21/22   [provider]  hydrOXYzine  (ATARAX /VISTARIL ) 25 MG tablet Take 1 tablet (25 mg total) by mouth every 6 (six) hours as needed for itching. Patient not taking: Reported on 05/16/2024 06/09/20   Stafford Eagles, PA-C  insulin  degludec (TRESIBA  FLEXTOUCH) 100 UNIT/ML SOPN FlexTouch Pen  Inject 0.3 mLs (30 Units total) into the skin daily at 10 pm. Patient not taking: Reported on 05/16/2024 07/18/18   Terre Ferri P, DO  Insulin  Pen Needle (PEN NEEDLES) 32G X 6 MM MISC 1 each by Does not apply route daily. 07/18/18   Johnson, Megan P, DO  OZEMPIC, 0.25 OR 0.5 MG/DOSE, 2 MG/3ML SOPN Inject 0.5 mg into the skin once a week. Patient not taking: Reported on 05/16/2024 02/15/23   [provider]  Vitamin D , Ergocalciferol , (DRISDOL ) 50000 units CAPS capsule Take 1 capsule (50,000 Units total) by mouth every 7 (seven) days. Patient not taking: Reported on 05/16/2024 07/19/18   Corbin Dess, PA-C      VITAL SIGNS:  Blood pressure (!) 134/90, pulse (!) 58, temperature 97.9 F (36.6 C), resp. rate 18, height 5\' 5"  (1.651 m), weight 78 kg, SpO2 100%.  PHYSICAL EXAMINATION:  Physical Exam  GENERAL:  78 y.o.-year-old African-American female patient lying in the bed with no acute distress.  EYES: Pupils equal, round, reactive to light and accommodation. No scleral icterus. Extraocular muscles intact.  HEENT: Head atraumatic, normocephalic. Oropharynx and nasopharynx clear.  NECK:  Supple, no jugular venous distention.  No thyroid  enlargement, no tenderness.  LUNGS: Normal breath sounds bilaterally, no wheezing, rales,rhonchi or crepitation. No use of accessory muscles of respiration.  CARDIOVASCULAR: Regular rate and rhythm, S1, S2 normal. No murmurs, rubs, or gallops.  ABDOMEN: Soft, nondistended, nontender. Bowel sounds present. No organomegaly or mass.  EXTREMITIES: No pedal edema, cyanosis, or clubbing.  NEUROLOGIC: Cranial nerves II through XII are intact. Muscle strength 5/5 in all extremities. Sensation intact. Gait not checked.  PSYCHIATRIC: The patient is alert and oriented x 3.  Normal affect and good eye contact. SKIN: No obvious rash, lesion, or ulcer.   LABORATORY PANEL:   CBC Recent Labs  Lab 05/16/24 0230  WBC 5.6  HGB 11.8*  HCT 35.7*  PLT 208   ------------------------------------------------------------------------------------------------------------------  Chemistries  Recent Labs  Lab 05/15/24 1929 05/16/24 0230  NA 133* 136  K 4.6 4.2  CL 97* 101  CO2 26 29  GLUCOSE 391* 270*  BUN 37* 30*  CREATININE 1.43* 1.13*  CALCIUM  9.2 9.0  AST 14*  --   ALT 9  --   ALKPHOS 56  --   BILITOT 0.7  --    ------------------------------------------------------------------------------------------------------------------  Cardiac Enzymes No results for input(s): "TROPONINI" in the last 168 hours. ------------------------------------------------------------------------------------------------------------------  RADIOLOGY:  No results found.    IMPRESSION AND PLAN:  Assessment and Plan: * Lower GI bleeding - The patient be admitted to a medical telemetry bed. - Will follow serial hemoglobins and hematocrits. - Will hold off aspirin. - GI consult will be obtained. -I notified Dr. Emerick Hanlon about the patient.  AKI (acute kidney injury) (HCC) - This is likely prerenal due to volume depletion and dehydration with GI bleeding. - Will continue hydration with half-normal  saline and follow BMP. - Will avoid nephrotoxins.  Seizure disorder (HCC) - Will continue Lamictal  and Depakote .  Essential hypertension - Okay to any antihypertensive therapy while holding off nephrotoxins.  Type 2 diabetes mellitus without complication, with long-term current use of insulin  (HCC) - The patient will be placed on supplemental coverage with NovoLog . - Will continue basal coverage.  Dyslipidemia - Will continue Zetia  and statin therapy.       DVT prophylaxis: SCDs.  Medical prophylaxis contraindicated due to GI bleeding. Advanced Care Planning:  Code Status: full code. Family Communication:  The plan  of care was discussed in details with the patient (and family). I answered all questions. The patient agreed to proceed with the above mentioned plan. Further management will depend upon hospital course. Disposition Plan: Back to previous home environment Consults called: GI All the records are reviewed and case discussed with ED provider.  Status is: Inpatient  At the time of the admission, it appears that the appropriate admission status for this patient is inpatient.  This is judged to be reasonable and necessary in order to provide the required intensity of service to ensure the patient's safety given the presenting symptoms, physical exam findings and initial radiographic and laboratory data in the context of comorbid conditions.  The patient requires inpatient status due to high intensity of service, high risk of further deterioration and high frequency of surveillance required.  I certify that at the time of admission, it is my clinical judgment that the patient will require inpatient hospital care extending more than 2 midnights.                            Dispo: The patient is from: Home              Anticipated d/c is to: Home              Patient currently is not medically stable to d/c.              Difficult to place patient: No  Virgene Griffin M.D on  05/16/2024 at 5:17 AM  [facility]   From 7 PM-7 AM, contact night-coverage [email or web address]  CC: Primary care physician; Evelena Hines, FNP (Inactive)

## 2024-05-16 NOTE — Assessment & Plan Note (Addendum)
-   This is likely prerenal due to volume depletion and dehydration with GI bleeding. - Will continue hydration with half-normal saline and follow BMP. - Will avoid nephrotoxins.

## 2024-05-16 NOTE — Plan of Care (Signed)
  Problem: Health Behavior/Discharge Planning: Goal: Ability to manage health-related needs will improve Outcome: Progressing   Problem: Clinical Measurements: Goal: Respiratory complications will improve Outcome: Progressing   Problem: Coping: Goal: Level of anxiety will decrease Outcome: Progressing   Problem: Pain Managment: Goal: General experience of comfort will improve and/or be controlled Outcome: Progressing

## 2024-05-16 NOTE — Inpatient Diabetes Management (Addendum)
 Inpatient Diabetes Program Recommendations  AACE/ADA: New Consensus Statement on Inpatient Glycemic Control (2015)  Target Ranges:  Prepandial:   less than 140 mg/dL      Peak postprandial:   less than 180 mg/dL (1-2 hours)      Critically ill patients:  140 - 180 mg/dL   Lab Results  Component Value Date   GLUCAP 83 05/16/2024   HGBA1C 12.7 (H) 05/16/2024    Latest Reference Range & Units 05/16/24 08:13 05/16/24 08:20 05/16/24 08:40 05/16/24 09:10 05/16/24 11:44  Glucose-Capillary 70 - 99 mg/dL 52 (L) 161 (H) Novolog  5 units 70 83 292 (H)  (L): Data is abnormally low (H): Data is abnormally high  Diabetes history: DM2 Outpatient Diabetes medications: Lantus  32 units daily, Amaryl  4 mg bid Current orders for Inpatient glycemic control: Novolog  0-15 units q 4 hrs., Amaryl  4 mg bid  Inpatient Diabetes Program Recommendations:   Spoke with Leatha Province RN regarding CBGs and insulin  doses this am. Per chart: 0813 CBG 52 0820 CBG 260 0840 CBG 70 0910 CBG 83 0926 Novolog  5 units and Amaryl  4 mg given  Please consider: -D/C Amaryl  while in the hospital and consider decrease dose on discharge home -Add Semglee  15 units now and daily -Decrease Novolog  correction to 0-6 q 4 hrs.  1300 pm: Spoke with patient and husband @ bedside and reviewed current A1c of 12.7 (Average blood glucose 318 over the past 2-3 months). Patient and husband shared patient has been drinking regular drinks with sugar. Reviewed normal ranges of CBGs and risks involved in elevated blood glucose. Patient states willingness to decrease amount of sugar in drinks and food. Ordered Living Well With Diabetes.  Thank you, Daris Harkins E. Deshea Pooley, RN, MSN, CDCES  Diabetes Coordinator Inpatient Glycemic Control Team Team Pager [phone] (8am-5pm) 05/16/2024 12:07 PM

## 2024-05-16 NOTE — Assessment & Plan Note (Signed)
-   The patient will be placed on supplemental coverage with NovoLog. - Will continue basal coverage.

## 2024-05-16 NOTE — Assessment & Plan Note (Signed)
-   Will continue Lamictal  and Depakote .

## 2024-05-16 NOTE — Hospital Course (Addendum)
 Hospital course / significant events:   HPI: Brittany Sherman is a 78 y.o. female with medical history significant for osteoarthritis, type 2 diabetes mellitus, hypertension, seizure disorder and CVA, presented to the emergency room with acute onset of bright red bleeding per rectum twice with no clots.  She denies any melena or nausea or vomiting or abdominal pain.   05/29: to ED 05/30: early AM admitted to hospitalist service w/ GI consult, plan for colonoscopy tomorrow  05/31: (+)hemorrhoids source of bleeding. H/H stable. Holding ASA and ok for discharge      Consultants:  Gastroenterology  Procedures/Surgeries: 05/17/24 colonoscopy       ASSESSMENT & PLAN:   Lower GI bleeding d/t hemorrhoids Constipation prevention Hold ASA until further instruction from PCP, may consider restart when benefit > risk   AKI (acute kidney injury) - resolved Monitor BMP   Seizure disorder continue Lamictal  and Depakote .   Essential hypertension Restart home meds   Type 2 diabetes mellitus without complication, with long-term current use of insulin  (HCC) Glc have been at goal here on lower dose insulin  Restart home long acting insulin  lower dose and titrate up Hold home glipizide for now   Dyslipidemia Continue home meds    overweight based on BMI: Body mass index is 28.62 kg/m.Aaron Aas Significantly low or high BMI is associated with higher medical risk.  Underweight - under 18  overweight - 25 to 29 obese - 30 or more Class 1 obesity: BMI of 30.0 to 34 Class 2 obesity: BMI of 35.0 to 39 Class 3 obesity: BMI of 40.0 to 49 Super Morbid Obesity: BMI 50-59 Super-super Morbid Obesity: BMI 60+ Healthy nutrition and physical activity advised as adjunct to other disease management and risk reduction treatments

## 2024-05-16 NOTE — Plan of Care (Signed)

## 2024-05-17 ENCOUNTER — Encounter: Payer: Self-pay | Admitting: Family Medicine

## 2024-05-17 ENCOUNTER — Encounter
Admission: EM | Disposition: A | Discharge status: Home / Self Care | Payer: Self-pay | Source: Home / Self Care | Attending: Osteopathic Medicine

## 2024-05-17 ENCOUNTER — Inpatient Hospital Stay: Admitting: Registered Nurse

## 2024-05-17 ENCOUNTER — Other Ambulatory Visit: Payer: Self-pay

## 2024-05-17 HISTORY — PX: POLYPECTOMY: SHX149

## 2024-05-17 HISTORY — PX: COLONOSCOPY: SHX5424

## 2024-05-17 LAB — HEMOGLOBIN AND HEMATOCRIT, BLOOD
HCT: 34.4 % — ABNORMAL LOW (ref 36.0–46.0)
HCT: 38.2 % (ref 36.0–46.0)
Hemoglobin: 11.3 g/dL — ABNORMAL LOW (ref 12.0–15.0)
Hemoglobin: 12.5 g/dL (ref 12.0–15.0)

## 2024-05-17 LAB — GLUCOSE, CAPILLARY
Glucose-Capillary: 172 mg/dL — ABNORMAL HIGH (ref 70–99)
Glucose-Capillary: 152 mg/dL — ABNORMAL HIGH (ref 70–99)

## 2024-05-17 SURGERY — COLONOSCOPY
Anesthesia: General

## 2024-05-17 MED ORDER — LANTUS SOLOSTAR 100 UNIT/ML ~~LOC~~ SOPN: PEN_INJECTOR | SUBCUTANEOUS | Status: AC

## 2024-05-17 MED ORDER — PROPOFOL 10 MG/ML IV BOLUS
INTRAVENOUS | Status: DC | PRN
  Administered 2024-05-17: 60 mg via INTRAVENOUS

## 2024-05-17 MED ORDER — PROPOFOL 500 MG/50ML IV EMUL
INTRAVENOUS | Status: DC | PRN
  Administered 2024-05-17: 150 ug/kg/min via INTRAVENOUS

## 2024-05-17 MED ORDER — PROPOFOL 10 MG/ML IV BOLUS
INTRAVENOUS | Status: AC
  Filled 2024-05-17: qty 40

## 2024-05-17 MED ORDER — LIDOCAINE HCL (PF) 2 % IJ SOLN
INTRAMUSCULAR | Status: AC
Start: 2024-05-17 — End: ?
  Filled 2024-05-17: qty 5

## 2024-05-17 MED ORDER — LIDOCAINE HCL (CARDIAC) PF 100 MG/5ML IV SOSY
PREFILLED_SYRINGE | INTRAVENOUS | Status: DC | PRN
  Administered 2024-05-17: 60 mg via INTRAVENOUS

## 2024-05-17 MED ORDER — SODIUM CHLORIDE 0.9 % IV SOLN: INTRAVENOUS | Status: DC | PRN

## 2024-05-17 NOTE — Op Note (Signed)
 [facility] Gastroenterology Patient Name: Brittany Sherman Procedure Date: 05/17/2024 7:06 AM MRN: [id number] Account #: [id number] Date of Birth: 02-Dec-1946 Admit Type: Inpatient Age: 78 Room: [facility] ENDO ROOM 4 Gender: Female Note Status: Finalized Instrument Name: Colonscope [id number] Procedure:             Colonoscopy Indications:           Evaluation of unexplained GI bleeding presenting with                         Hematochezia Providers:             Bridgett Camps, DO Referring MD:          Hedwig Livers Medicines:             Monitored Anesthesia Care Complications:         No immediate complications. Estimated blood loss:                         Minimal. Procedure:             Pre-Anesthesia Assessment:                        - Prior to the procedure, a History and Physical was                         performed, and patient medications and allergies were                         reviewed. The patient is competent. The risks and                         benefits of the procedure and the sedation options and                         risks were discussed with the patient. All questions                         were answered and informed consent was obtained.                         Patient identification and proposed procedure were                         verified by the physician, the nurse, the anesthetist                         and the technician in the endoscopy suite. Mental                         Status Examination: alert and oriented. Airway                         Examination: normal oropharyngeal airway and neck                         mobility. Respiratory Examination: clear to  auscultation. CV Examination: RRR, no murmurs, no S3                         or S4. Prophylactic Antibiotics: The patient does not                         require prophylactic antibiotics. Prior                         Anticoagulants: The patient  has taken no anticoagulant                         or antiplatelet agents. ASA Grade Assessment: III - A                         patient with severe systemic disease. After reviewing                         the risks and benefits, the patient was deemed in                         satisfactory condition to undergo the procedure. The                         anesthesia plan was to use monitored anesthesia care                         (MAC). Immediately prior to administration of                         medications, the patient was re-assessed for adequacy                         to receive sedatives. The heart rate, respiratory                         rate, oxygen saturations, blood pressure, adequacy of                         pulmonary ventilation, and response to care were                         monitored throughout the procedure. The physical                         status of the patient was re-assessed after the                         procedure.                        After obtaining informed consent, the colonoscope was                         passed under direct vision. Throughout the procedure,                         the patient's blood pressure, pulse, and oxygen  saturations were monitored continuously. The                         Colonoscope was introduced through the anus and                         advanced to the the cecum, identified by appendiceal                         orifice and ileocecal valve. The colonoscopy was                         performed without difficulty. The patient tolerated                         the procedure well. The quality of the bowel                         preparation was poor. The ileocecal valve, appendiceal                         orifice, and rectum were photographed. Findings:      The perianal and digital rectal examinations were normal. Pertinent       negatives include normal sphincter tone.      Extensive amounts  of copious quantities of semi-liquid semi-solid stool       was found in the entire colon, precluding visualization. Lavage of the       area was performed, resulting in incomplete clearance with fair       visualization.      A 1 to 2 mm polyp was found in the ascending colon. The polyp was       sessile. The polyp was removed with a jumbo cold forceps. Resection and       retrieval were complete. Estimated blood loss was minimal.      Non-bleeding internal hemorrhoids were found during retroflexion. The       hemorrhoids were medium-sized. Estimated blood loss: none.      Mucosa that was visualized appeared to be normal. No fresh blood or       melena through the colon. Impression:            - Preparation of the colon was poor.                        - Stool in the entire examined colon.                        - One 1 to 2 mm polyp in the ascending colon, removed                         with a jumbo cold forceps. Resected and retrieved.                        - Non-bleeding internal hemorrhoids. Recommendation:        - Patient has a contact number available for                         emergencies. The signs and symptoms of potential  delayed complications were discussed with the patient.                         Return to normal activities tomorrow. Written                         discharge instructions were provided to the patient.                        - Return patient to hospital ward for ongoing care.                        - Resume regular diet.                        - Continue present medications.                        - Await pathology results.                        - The findings and recommendations were discussed with                         the patient.                        - The findings and recommendations were discussed with                         the referring physician.                        - Hemorrhoids the source of bright red blood per                          rectum. No further intervention indicated at this                         time. Anusol suppositories if bleeding recurs. GI to                         sign off. Call back if needed. Procedure Code(s):     --- Professional ---                        [phone], Colonoscopy, flexible; with biopsy, single or                         multiple Diagnosis Code(s):     --- Professional ---                        K64.8, Other hemorrhoids                        D12.2, Benign neoplasm of ascending colon                        K92.1, Melena (includes Hematochezia) CPT copyright 2022 American Medical Association. All rights reserved. The codes documented in this report are preliminary and upon coder review may  be revised to meet current compliance requirements. Attending Participation:      I personally performed the entire procedure. Polo Brisk, DO Quintin Buckle DO, DO 05/17/2024 8:43:13 AM This report has been signed electronically. Number of Addenda: 0 Note Initiated On: 05/17/2024 7:06 AM Scope Withdrawal Time: 0 hours 5 minutes 47 seconds  Total Procedure Duration: 0 hours 12 minutes 22 seconds  Estimated Blood Loss:  Estimated blood loss was minimal.      [facility]

## 2024-05-17 NOTE — Discharge Summary (Signed)
 Physician Discharge Summary   Patient: Brittany Sherman MRN: [id number]  DOB: Apr 18, 1946   Admit:     Date of Admission: 05/15/2024 Admitted from: home   Discharge: Date of discharge: 05/17/24 Disposition: Home Condition at discharge: good  CODE STATUS: FULL CODE     Discharge Physician: Melodi Sprung, DO [facility]     PCP: Evelena Hines, FNP (Inactive)  Recommendations for Outpatient Follow-up:  Follow up with PCP Evelena Hines, FNP (Inactive) in 1--2 weeks Please obtain labs/tests: CBC, BMP in 1-2 weeks, monitor Glc / adjust meds as needed, consider restart ASA   Discharge Instructions     Diet Carb Modified   Complete by: As directed    Increase activity slowly   Complete by: As directed          Discharge Diagnoses: Principal Problem:   Lower GI bleeding Active Problems:   AKI (acute kidney injury) (HCC)   Dyslipidemia   Type 2 diabetes mellitus without complication, with long-term current use of insulin  [facility])   Essential hypertension   Seizure disorder [facility])     Hospital course / significant events:   HPI: Brittany Sherman is a 78 y.o. female with medical history significant for osteoarthritis, type 2 diabetes mellitus, hypertension, seizure disorder and CVA, presented to the emergency room with acute onset of bright red bleeding per rectum twice with no clots.  She denies any melena or nausea or vomiting or abdominal pain.   05/29: to ED 05/30: early AM admitted to hospitalist service w/ GI consult, plan for colonoscopy tomorrow  05/31: (+)hemorrhoids source of bleeding. H/H stable. Holding ASA and ok for discharge      Consultants:  Gastroenterology  Procedures/Surgeries: 05/17/24 colonoscopy       ASSESSMENT & PLAN:   Lower GI bleeding d/t hemorrhoids Constipation prevention Hold ASA until further instruction from PCP, may consider restart when benefit > risk   AKI (acute kidney injury) - resolved Monitor  BMP   Seizure disorder continue Lamictal  and Depakote .   Essential hypertension Restart home meds   Type 2 diabetes mellitus without complication, with long-term current use of insulin  (HCC) Glc have been at goal here on lower dose insulin  Restart home long acting insulin  lower dose and titrate up Hold home glipizide for now   Dyslipidemia Continue home meds    overweight based on BMI: Body mass index is 28.62 kg/m.Aaron Aas Significantly low or high BMI is associated with higher medical risk.  Underweight - under 18  overweight - 25 to 29 obese - 30 or more Class 1 obesity: BMI of 30.0 to 34 Class 2 obesity: BMI of 35.0 to 39 Class 3 obesity: BMI of 40.0 to 49 Super Morbid Obesity: BMI 50-59 Super-super Morbid Obesity: BMI 60+ Healthy nutrition and physical activity advised as adjunct to other disease management and risk reduction treatments              Discharge Instructions  Allergies as of 05/17/2024       Reactions   Aspirin    Stroke   Metformin  And Related Diarrhea   Penicillins Rash, Other (See Comments)   Has patient had a PCN reaction causing immediate rash, facial/tongue/throat swelling, SOB or lightheadedness with hypotension: Yes Has patient had a PCN reaction causing severe rash involving mucus membranes or skin necrosis: No Has patient had a PCN reaction that required hospitalization No Has patient had a PCN reaction occurring within the last 10 years: Yes If all  of the above answers are "NO", then may proceed with Cephalosporin use.        Medication List     STOP taking these medications    Aspirin Low Dose 81 MG tablet Generic drug: aspirin EC   brimonidine 0.2 % ophthalmic solution Commonly known as: ALPHAGAN   gatifloxacin 0.5 % Soln Commonly known as: ZYMAXID   glimepiride  4 MG tablet Commonly known as: AMARYL    hydrOXYzine  25 MG tablet Commonly known as: ATARAX    ibuprofen  600 MG tablet Commonly known as: ADVIL    insulin   degludec 100 UNIT/ML FlexTouch Pen Commonly known as: Tresiba  FlexTouch   Ozempic (0.25 or 0.5 MG/DOSE) 2 MG/3ML Sopn Generic drug: Semaglutide(0.25 or 0.5MG /DOS)   Vitamin D  (Ergocalciferol ) 1.25 MG (50000 UNIT) Caps capsule Commonly known as: DRISDOL        TAKE these medications    amLODipine  5 MG tablet Commonly known as: NORVASC  Take 1 tablet (5 mg total) by mouth daily. For further refills needs to see provider.   Calcium  600+D 600-10 MG-MCG Tabs Generic drug: Calcium  Carb-Cholecalciferol  Take 1 tablet by mouth 2 (two) times daily.   divalproex  250 MG DR tablet Commonly known as: DEPAKOTE  Take 250 mg by mouth 2 (two) times daily.   ezetimibe  10 MG tablet Commonly known as: ZETIA  Take 10 mg by mouth daily.   Fish Oil 1000 MG Caps Take by mouth in the morning and at bedtime.   lamoTRIgine  150 MG tablet Commonly known as: LAMICTAL  Take 150 mg by mouth daily.   Lantus  SoloStar 100 UNIT/ML Solostar Pen Generic drug: insulin  glargine Measure fasting blood sugar every day: goal for now is to get this 120-150. Start Lantus   at 20 units sq at bedtime. Increase daily Insulin  dose by 3 units at a time, twice per week, until fasting sugars are consistently 120-150, then continue at that dose. What changed:  how much to take how to take this when to take this additional instructions   lidocaine  5 % Commonly known as: Lidoderm  Place 1 patch onto the skin every 12 (twelve) hours. Remove & Discard patch within 12 hours or as directed by MD   lisinopril-hydrochlorothiazide  20-25 MG tablet Commonly known as: ZESTORETIC Take 1 tablet by mouth daily.   magnesium oxide 400 MG tablet Commonly known as: MAG-OX Take 1 tablet by mouth 2 (two) times daily.   Pen Needles 32G X 6 MM Misc 1 each by Does not apply route daily.   rosuvastatin  5 MG tablet Commonly known as: CRESTOR  Take 5 mg by mouth daily.         Follow-up Information     Evelena Hines, FNP Follow up.    Specialty: Family Medicine Why: Office is closed today, pt has to make thier own hospital follow up. Contact information: [address] [place] [address] [phone]                 Allergies  Allergen Reactions   Aspirin     Stroke   Metformin  And Related Diarrhea   Penicillins Rash and Other (See Comments)    Has patient had a PCN reaction causing immediate rash, facial/tongue/throat swelling, SOB or lightheadedness with hypotension: Yes  Has patient had a PCN reaction causing severe rash involving mucus membranes or skin necrosis: No  Has patient had a PCN reaction that required hospitalization No  Has patient had a PCN reaction occurring within the last 10 years: Yes  If all of the above answers are "NO",  then may proceed with Cephalosporin use.     Subjective: pt feeling well this morning following colonoscopy, ambulating independently, no further bleeding, tolerating diet, no CP/SOB   Discharge Exam: BP (!) 177/79   Pulse 61   Temp (!) 96.6 F (35.9 C) (Temporal)   Resp 14   Ht 5\' 5"  (1.651 m)   Wt 78 kg   SpO2 97%   BMI 28.62 kg/m  General: Pt is alert, awake, not in acute distress Cardiovascular: RRR, S1/S2 +, no rubs, no gallops Respiratory: CTA bilaterally, no wheezing, no rhonchi Abdominal: Soft, NT, ND, bowel sounds + Extremities: no edema, no cyanosis     The results of significant diagnostics from this hospitalization (including imaging, microbiology, ancillary and laboratory) are listed below for reference.     Microbiology: No results found for this or any previous visit (from the past 240 hours).   Labs: BNP (last 3 results) No results for input(s): "BNP" in the last 8760 hours. Basic Metabolic Panel: Recent Labs  Lab 05/15/24 1929 05/16/24 0230  NA 133* 136  K 4.6 4.2  CL 97* 101  CO2 26 29  GLUCOSE 391* 270*  BUN 37* 30*  CREATININE 1.43* 1.13*  CALCIUM  9.2 9.0   Liver Function Tests: Recent Labs  Lab  05/15/24 1929  AST 14*  ALT 9  ALKPHOS 56  BILITOT 0.7  PROT 7.3  ALBUMIN 3.7   No results for input(s): "LIPASE", "AMYLASE" in the last 168 hours. No results for input(s): "AMMONIA" in the last 168 hours. CBC: Recent Labs  Lab 05/15/24 1929 05/16/24 0230 05/16/24 0742 05/16/24 1101 05/16/24 1756 05/17/24 0202 05/17/24 0955  WBC 5.8 5.6  --   --   --   --   --   HGB 12.3 11.8* 11.5* 11.8* 12.8 11.3* 12.5  HCT 37.5 35.7* 35.2* 36.1 38.9 34.4* 38.2  MCV 81.7 80.2  --   --   --   --   --   PLT 235 208  --   --   --   --   --    Cardiac Enzymes: No results for input(s): "CKTOTAL", "CKMB", "CKMBINDEX", "TROPONINI" in the last 168 hours. BNP: Invalid input(s): "POCBNP" CBG: Recent Labs  Lab 05/16/24 1702 05/16/24 2004 05/16/24 2343 05/17/24 0412 05/17/24 0755  GLUCAP 110* 176* 147* 172* 152*   D-Dimer No results for input(s): "DDIMER" in the last 72 hours. Hgb A1c Recent Labs    05/16/24 0230  HGBA1C 12.7*   Lipid Profile No results for input(s): "CHOL", "HDL", "LDLCALC", "TRIG", "CHOLHDL", "LDLDIRECT" in the last 72 hours. Thyroid  function studies No results for input(s): "TSH", "T4TOTAL", "T3FREE", "THYROIDAB" in the last 72 hours.  Invalid input(s): "FREET3" Anemia work up No results for input(s): "VITAMINB12", "FOLATE", "FERRITIN", "TIBC", "IRON", "RETICCTPCT" in the last 72 hours. Urinalysis    Component Value Date/Time   COLORURINE YELLOW (A) 12/11/2015 1615   APPEARANCEUR Clear 12/24/2018 1354   LABSPEC 1.028 12/11/2015 1615   PHURINE 6.0 12/11/2015 1615   GLUCOSEU 2+ (A) 12/24/2018 1354   HGBUR NEGATIVE 12/11/2015 1615   BILIRUBINUR Negative 12/24/2018 1354   KETONESUR TRACE (A) 12/11/2015 1615   PROTEINUR Negative 12/24/2018 1354   PROTEINUR NEGATIVE 12/11/2015 1615   NITRITE Negative 12/24/2018 1354   NITRITE NEGATIVE 12/11/2015 1615   LEUKOCYTESUR Negative 12/24/2018 1354   Sepsis Labs Recent Labs  Lab 05/15/24 1929 05/16/24 0230   WBC 5.8 5.6   Microbiology No results found for this or any previous visit (  from the past 240 hours). Imaging No results found.    Time coordinating discharge: over 30 minutes  SIGNED:  Kambrie Eddleman DO [facility]

## 2024-05-17 NOTE — Interval H&P Note (Signed)
 History and Physical Interval Note: Preprocedure H&P from 05/17/24  was reviewed and there was no interval change after seeing and examining the patient.  Written consent was obtained from the patient after discussion of risks, benefits, and alternatives. Patient has consented to proceed with Colonoscopy with possible intervention   05/17/2024 8:10 AM  Brittany Sherman  has presented today for surgery, with the diagnosis of Rectal bleeding.  The various methods of treatment have been discussed with the patient and family. After consideration of risks, benefits and other options for treatment, the patient has consented to  Procedure(s): COLONOSCOPY (N/A) as a surgical intervention.  The patient's history has been reviewed, patient examined, no change in status, stable for surgery.  I have reviewed the patient's chart and labs.  Questions were answered to the patient's satisfaction.     Quintin Buckle

## 2024-05-17 NOTE — Anesthesia Postprocedure Evaluation (Signed)
 Anesthesia Post Note  Patient: Brittany Sherman  Procedure(s) Performed: COLONOSCOPY POLYPECTOMY, INTESTINE  Patient location during evaluation: Endoscopy Anesthesia Type: General Level of consciousness: awake and alert Pain management: pain level controlled Vital Signs Assessment: post-procedure vital signs reviewed and stable Respiratory status: spontaneous breathing, nonlabored ventilation, respiratory function stable and patient connected to nasal cannula oxygen Cardiovascular status: blood pressure returned to baseline and stable Postop Assessment: no apparent nausea or vomiting Anesthetic complications: no   No notable events documented.   Last Vitals:  Vitals:   05/17/24 0849 05/17/24 0859  BP: (!) 170/73 (!) 177/79  Pulse: 64 61  Resp: 19 14  Temp:    SpO2: 96% 97%    Last Pain:  Vitals:   05/17/24 1000  TempSrc:   PainSc: 0-No pain                 Vanice Genre

## 2024-05-17 NOTE — Anesthesia Procedure Notes (Signed)
 Date/Time: 05/17/2024 8:15 AM  Performed by: Racheal Buddle, CRNAPre-anesthesia Checklist: Patient identified, Emergency Drugs available, Suction available and Patient being monitored Patient Re-evaluated:Patient Re-evaluated prior to induction Oxygen Delivery Method: Nasal cannula Induction Type: IV induction Dental Injury: Teeth and Oropharynx as per pre-operative assessment  Comments: Nasal cannula with etCO2 monitoring

## 2024-05-17 NOTE — TOC Transition Note (Signed)
 Transition of Care [facility]) - Discharge Note   Patient Details  Name: Brittany Sherman MRN: [id number] Date of Birth: 03/19/1946  Transition of Care [facility]) CM/SW Contact:  Alexandra Ice, RN Phone Number: 05/17/2024, 11:19 AM   Clinical Narrative:    Patient to discharge today, to home. Patient independent, her grand-son lives with her. Spoke with patient and discussed observation status, she verbalized understanding. Printed Medicare MOON letter to nurse station, notified bedside nurse.     Barriers to Discharge: Barriers Resolved   Patient Goals and CMS Choice Patient states their goals for this hospitalization and ongoing recovery are:: go home      Expected Discharge Plan and Services     Discharge Planning Services: CM Consult   Living arrangements for the past 2 months: Single Family Home Expected Discharge Date: 05/17/24                 DME Agency: NA       HH Arranged: NA          Prior Living Arrangements/Services Living arrangements for the past 2 months: Single Family Home Lives with:: Other (Comment) (adult grandson lives with her)                   Activities of Daily Living      Permission Sought/Granted                  Emotional Assessment       Orientation: : Oriented to Self, Oriented to Place, Oriented to  Time, Oriented to Situation   Psych Involvement: No (comment)  Admission diagnosis:  Rectal bleeding [K62.5] Lower GI bleeding [K92.2] Hyperglycemia [R73.9] AKI (acute kidney injury) (HCC) [N17.9] Patient Active Problem List   Diagnosis Date Noted   Lower GI bleeding 05/16/2024   Dyslipidemia 05/16/2024   AKI (acute kidney injury) (HCC) 05/16/2024   Type 2 diabetes mellitus without complication, with long-term current use of insulin  (HCC) 05/16/2024   Essential hypertension 05/16/2024   Seizure disorder (HCC) 05/16/2024   History of colon polyps 01/29/2024   Seizures (HCC) 09/18/2022   Yeast infection  12/24/2018   Aortic atherosclerosis (HCC) 08/12/2018   Adrenal nodule (HCC) 08/12/2018   Baker's cyst of knee, left 08/13/2017   Traumatic avulsion of nail plate of finger 07/10/2017   Osteoarthritis 02/27/2017   CKD (chronic kidney disease), stage III (HCC) 02/27/2017   Hyperlipidemia associated with type 2 diabetes mellitus (HCC) 02/27/2017   Benign hypertensive renal disease    Uncontrolled type 2 diabetes with neuropathy    History of stroke without residual deficits    Vitamin D  deficiency    Trigger finger of right thumb    Adenomatous polyp of colon    Partial epilepsy with impairment of consciousness (HCC)    PCP:  Evelena Hines, FNP (Inactive) Pharmacy:   [facility] [address]), [place] - [address] [place] ([place]) [place] [address] Phone: [phone] Fax: [phone]  [facility] [place], [place] - [address] [place] [address] Phone: [phone] Fax: [phone]     Social Determinants of Health (SDOH) Interventions    Readmission Risk Interventions     No data to display           Final next level of care: Home/Self Care Barriers to Discharge: Barriers Resolved   Patient Goals and CMS Choice Patient states their goals for this hospitalization and ongoing recovery  are:: go home          Discharge Placement                  Name of family member notified: Lyle San Patient and family notified of of transfer: 05/17/24  Discharge Plan and Services Additional resources added to the After Visit Summary for     Discharge Planning Services: CM Consult              DME Agency: NA       HH Arranged: NA          Social Drivers of Health (SDOH) Interventions SDOH Screenings   Food Insecurity: No Food Insecurity (07/18/2018)  Transportation Needs: No Transportation Needs (07/18/2018)  Financial Resource Strain: Low Risk  (07/18/2018)  Physical Activity:  Inactive (07/18/2018)  Social Connections: Moderately Integrated (07/18/2018)  Stress: No Stress Concern Present (07/18/2018)  Tobacco Use: Medium Risk (05/17/2024)     Readmission Risk Interventions     No data to display

## 2024-05-17 NOTE — Plan of Care (Signed)
  Problem: Health Behavior/Discharge Planning: Goal: Ability to manage health-related needs will improve Outcome: Progressing   Problem: Clinical Measurements: Goal: Diagnostic test results will improve Outcome: Progressing   Problem: Coping: Goal: Level of anxiety will decrease Outcome: Progressing   Problem: Pain Managment: Goal: General experience of comfort will improve and/or be controlled Outcome: Progressing

## 2024-05-17 NOTE — H&P (Signed)
 Pre-Procedure H&P   Patient ID: Brittany Sherman is a 78 y.o. female.  Gastroenterology Provider: Quintin Buckle, DO  PCP: Evelena Hines, FNP (Inactive)  Date: 05/17/2024  HPI [name] is a 78 y.o. female who presents today for inpatient Colonoscopy for hematochezia.  Pt able to complete prep o/n. Hgb stable at 11.3.  No further hematochezia during prep. S/p left hip replacement.   Past Medical History:  Diagnosis Date   Arthritis    Benign neoplasm of rectum and anal canal    Diabetes mellitus without complication (HCC)    History of vertigo    Hypertension    Immune deficiency disorder (HCC)    Infectious colitis, enteritis and gastroenteritis    Partial epilepsy with impairment of consciousness (HCC)    Seizures (HCC)    Stroke (HCC)    Trigger finger of right thumb    Vitamin D  deficiency     Past Surgical History:  Procedure Laterality Date   ABDOMINAL HYSTERECTOMY     BACK SURGERY     COLONOSCOPY WITH PROPOFOL  N/A 07/24/2017   Procedure: COLONOSCOPY WITH PROPOFOL ;  Surgeon: Luke Salaam, MD;  Location: [facility] ENDOSCOPY;  Service: Endoscopy;  Laterality: N/A;   COLONOSCOPY WITH PROPOFOL  N/A 08/03/2020   Procedure: COLONOSCOPY WITH PROPOFOL ;  Surgeon: Luke Salaam, MD;  Location: [facility] ENDOSCOPY;  Service: Gastroenterology;  Laterality: N/A;   COLONOSCOPY WITH PROPOFOL  N/A 01/29/2024   Procedure: COLONOSCOPY WITH PROPOFOL ;  Surgeon: Luke Salaam, MD;  Location: [facility] ENDOSCOPY;  Service: Gastroenterology;  Laterality: N/A;   OOPHORECTOMY     POLYPECTOMY  01/29/2024   Procedure: POLYPECTOMY;  Surgeon: Luke Salaam, MD;  Location: [facility] ENDOSCOPY;  Service: Gastroenterology;;   SPINE SURGERY     TOTAL HIP ARTHROPLASTY  2017    Family History No h/o GI disease or malignancy  Review of Systems  Constitutional:  Negative for activity change, appetite change, chills, diaphoresis, fatigue, fever and unexpected weight change.  HENT:  Negative for trouble  swallowing and voice change.   Respiratory:  Negative for shortness of breath and wheezing.   Cardiovascular:  Negative for chest pain, palpitations and leg swelling.  Gastrointestinal:  Positive for blood in stool. Negative for abdominal distention, abdominal pain, anal bleeding, constipation, diarrhea, nausea, rectal pain and vomiting.  Skin:  Negative for color change and pallor.  Psychiatric/Behavioral:  Negative for confusion.   All other systems reviewed and are negative.    Medications No current facility-administered medications on file prior to encounter.   Current Outpatient Medications on File Prior to Encounter  Medication Sig Dispense Refill   amLODipine  (NORVASC ) 5 MG tablet Take 1 tablet (5 mg total) by mouth daily. For further refills needs to see provider. 30 tablet 0   ASPIRIN LOW DOSE 81 MG tablet Take 81 mg by mouth daily.     CALCIUM  600+D 600-10 MG-MCG TABS Take 1 tablet by mouth 2 (two) times daily.     divalproex  (DEPAKOTE ) 250 MG DR tablet Take 250 mg by mouth 2 (two) times daily.     ezetimibe  (ZETIA ) 10 MG tablet Take 10 mg by mouth daily.     glimepiride  (AMARYL ) 4 MG tablet Take 1 tablet by mouth twice daily 180 tablet 0   ibuprofen  (ADVIL ) 600 MG tablet Take 1 tablet (600 mg total) by mouth every 8 (eight) hours as needed for moderate pain or headache. 20 tablet 0   lamoTRIgine  (LAMICTAL ) 150 MG tablet Take 150 mg by mouth daily.  LANTUS  SOLOSTAR 100 UNIT/ML Solostar Pen Inject 32 Units into the skin at bedtime.     lidocaine  (LIDODERM ) 5 % Place 1 patch onto the skin every 12 (twelve) hours. Remove & Discard patch within 12 hours or as directed by MD 10 patch 0   lisinopril-hydrochlorothiazide  (ZESTORETIC) 20-25 MG tablet Take 1 tablet by mouth daily.     magnesium oxide (MAG-OX) 400 MG tablet Take 1 tablet by mouth 2 (two) times daily.     Omega-3 Fatty Acids (FISH OIL) 1000 MG CAPS Take by mouth in the morning and at bedtime.     rosuvastatin  (CRESTOR ) 5  MG tablet Take 5 mg by mouth daily.     brimonidine (ALPHAGAN) 0.2 % ophthalmic solution 1 drop 2 (two) times daily. (Patient not taking: Reported on 05/16/2024)     gatifloxacin (ZYMAXID) 0.5 % SOLN Place 1 drop into the left eye 4 (four) times daily. (Patient not taking: Reported on 05/16/2024)     hydrOXYzine  (ATARAX /VISTARIL ) 25 MG tablet Take 1 tablet (25 mg total) by mouth every 6 (six) hours as needed for itching. (Patient not taking: Reported on 05/16/2024) 30 tablet 0   insulin  degludec (TRESIBA  FLEXTOUCH) 100 UNIT/ML SOPN FlexTouch Pen Inject 0.3 mLs (30 Units total) into the skin daily at 10 pm. (Patient not taking: Reported on 05/16/2024) 3 mL 3   Insulin  Pen Needle (PEN NEEDLES) 32G X 6 MM MISC 1 each by Does not apply route daily. 100 each 12   OZEMPIC, 0.25 OR 0.5 MG/DOSE, 2 MG/3ML SOPN Inject 0.5 mg into the skin once a week. (Patient not taking: Reported on 05/16/2024)     Vitamin D , Ergocalciferol , (DRISDOL ) 50000 units CAPS capsule Take 1 capsule (50,000 Units total) by mouth every 7 (seven) days. (Patient not taking: Reported on 05/16/2024) 12 capsule 3    Pertinent medications related to GI and procedure were reviewed by me with the patient prior to the procedure   Current Facility-Administered Medications:    [MAR Hold] amLODipine  (NORVASC ) tablet 5 mg, 5 mg, Oral, Daily, Mansy, Jan A, MD, 5 mg at 05/16/24 1478   [facility] Hold] calcium -vitamin D  (OSCAL WITH D) 500-5 MG-MCG per tablet 1 tablet, 1 tablet, Oral, BID WC, Mansy, Jan A, MD, 1 tablet at 05/16/24 1816   [MAR Hold] divalproex  (DEPAKOTE ) DR tablet 250 mg, 250 mg, Oral, BID, Mansy, Jan A, MD, 250 mg at 05/16/24 2211   [facility] Hold] ezetimibe  (ZETIA ) tablet 10 mg, 10 mg, Oral, Daily, Mansy, Jan A, MD, 10 mg at 05/16/24 0927   [facility] Hold] insulin  aspart (novoLOG ) injection 0-6 Units, 0-6 Units, Subcutaneous, TID WC, Alexander, Natalie, DO   [facility] Hold] lamoTRIgine  (LAMICTAL ) tablet 150 mg, 150 mg, Oral, Daily, Mansy, Jan A, MD, 150 mg  at 05/16/24 0923   [facility] Hold] lidocaine  (LIDODERM ) 5 % 1 patch, 1 patch, Transdermal, Q12H, Mansy, Jan A, MD, 1 patch at 05/16/24 2210   [facility] Hold] omega-3 acid ethyl esters (LOVAZA ) capsule 1 g, 1 g, Oral, BID, Mansy, Jan A, MD, 1 g at 05/16/24 2211   [facility] Hold] ondansetron  (ZOFRAN ) tablet 4 mg, 4 mg, Oral, Q6H PRN **OR** [MAR Hold] ondansetron  (ZOFRAN ) injection 4 mg, 4 mg, Intravenous, Q6H PRN, Mansy, Jan A, MD   [MAR Hold] pantoprazole  (PROTONIX ) injection 40 mg, 40 mg, Intravenous, Daily, Mansy, Jan A, MD, 40 mg at 05/16/24 2956   [facility] Hold] rosuvastatin  (CRESTOR ) tablet 5 mg, 5 mg, Oral, Daily, Mansy, Jan A, MD, 5 mg at 05/16/24 2130   [  MAR Hold] traZODone  (DESYREL ) tablet 25 mg, 25 mg, Oral, QHS PRN, Mansy, Anastasio Kaska, MD   [MAR Hold] ondansetron  **OR** [MAR Hold] ondansetron  (ZOFRAN ) IV, [MAR Hold] traZODone    Allergies  Allergen Reactions   Aspirin     Stroke   Metformin  And Related Diarrhea   Penicillins Rash and Other (See Comments)    Has patient had a PCN reaction causing immediate rash, facial/tongue/throat swelling, SOB or lightheadedness with hypotension: Yes  Has patient had a PCN reaction causing severe rash involving mucus membranes or skin necrosis: No  Has patient had a PCN reaction that required hospitalization No  Has patient had a PCN reaction occurring within the last 10 years: Yes  If all of the above answers are "NO", then may proceed with Cephalosporin use.   Allergies were reviewed by me prior to the procedure  Objective   Body mass index is 28.62 kg/m. Vitals:   05/16/24 1704 05/16/24 2002 05/17/24 0431 05/17/24 0754  BP: (!) 155/70 (!) 170/75 138/74 (!) 161/64  Pulse: 63 61 69 67  Resp: 20 18 17 18   Temp: 98.2 F (36.8 C) 97.9 F (36.6 C) 98.1 F (36.7 C) 97.6 F (36.4 C)  TempSrc: Oral  Oral   SpO2: 99% 98% 97% 98%  Weight:      Height:         Physical Exam Vitals and nursing note reviewed.  Constitutional:      General: She is not in  acute distress.    Appearance: Normal appearance. She is not ill-appearing, toxic-appearing or diaphoretic.  HENT:     Head: Normocephalic and atraumatic.     Nose: Nose normal.     Mouth/Throat:     Mouth: Mucous membranes are moist.     Pharynx: Oropharynx is clear.  Eyes:     General: No scleral icterus.    Extraocular Movements: Extraocular movements intact.  Cardiovascular:     Rate and Rhythm: Normal rate and regular rhythm.     Heart sounds: Normal heart sounds. No murmur heard.    No friction rub. No gallop.  Pulmonary:     Effort: Pulmonary effort is normal. No respiratory distress.     Breath sounds: Normal breath sounds. No wheezing, rhonchi or rales.  Abdominal:     General: Bowel sounds are normal. There is no distension.     Palpations: Abdomen is soft.     Tenderness: There is no abdominal tenderness. There is no guarding or rebound.  Musculoskeletal:     Cervical back: Neck supple.     Right lower leg: No edema.     Left lower leg: No edema.  Skin:    General: Skin is warm and dry.     Coloration: Skin is not jaundiced or pale.  Neurological:     General: No focal deficit present.     Mental Status: She is alert and oriented to person, place, and time. Mental status is at baseline.  Psychiatric:        Mood and Affect: Mood normal.        Behavior: Behavior normal.        Thought Content: Thought content normal.        Judgment: Judgment normal.      Assessment:  Ms. MARSENA TAFF is a 78 y.o. female  who presents today for inpatient Colonoscopy for hematochezia.  Plan:  Colonoscopy with possible intervention today  Colonoscopy with possible biopsy, control of bleeding, polypectomy, and interventions as  necessary has been discussed with the patient/patient representative. Informed consent was obtained from the patient/patient representative after explaining the indication, nature, and risks of the procedure including but not limited to death, bleeding,  perforation, missed neoplasm/lesions, cardiorespiratory compromise, and reaction to medications. Opportunity for questions was given and appropriate answers were provided. Patient/patient representative has verbalized understanding is amenable to undergoing the procedure.   Quintin Buckle, DO  [facility] Gastroenterology  Portions of the record may have been created with voice recognition software. Occasional wrong-word or 'sound-a-like' substitutions may have occurred due to the inherent limitations of voice recognition software.  Read the chart carefully and recognize, using context, where substitutions may have occurred.

## 2024-05-17 NOTE — Anesthesia Preprocedure Evaluation (Signed)
 Anesthesia Evaluation  Patient identified by MRN, date of birth, ID band Patient awake    Reviewed: Allergy & Precautions, NPO status , Patient's Chart, lab work & pertinent test results  History of Anesthesia Complications Negative for: history of anesthetic complications  Airway Mallampati: III  TM Distance: <3 FB Neck ROM: full    Dental  (+) Lower Dentures, Upper Dentures, Dental Advidsory Given   Pulmonary neg shortness of breath, neg COPD, neg recent URI, former smoker   Pulmonary exam normal        Cardiovascular Exercise Tolerance: Good hypertension, (-) angina (-) Past MI and (-) Cardiac Stents Normal cardiovascular exam(-) dysrhythmias (-) Valvular Problems/Murmurs     Neuro/Psych Seizures -,  CVA  negative psych ROS   GI/Hepatic negative GI ROS, Neg liver ROS,,,  Endo/Other  diabetes    Renal/GU Renal disease  negative genitourinary   Musculoskeletal   Abdominal   Peds  Hematology negative hematology ROS (+)   Anesthesia Other Findings Past Medical History: No date: Arthritis No date: Benign neoplasm of rectum and anal canal No date: Diabetes mellitus without complication (HCC) No date: History of vertigo No date: Hypertension No date: Immune deficiency disorder (HCC) No date: Infectious colitis, enteritis and gastroenteritis No date: Partial epilepsy with impairment of consciousness (HCC) No date: Seizures (HCC) No date: Stroke (HCC) No date: Trigger finger of right thumb No date: Vitamin D  deficiency  Past Surgical History: No date: ABDOMINAL HYSTERECTOMY No date: BACK SURGERY 07/24/2017: COLONOSCOPY WITH PROPOFOL ; N/A     Comment:  Procedure: COLONOSCOPY WITH PROPOFOL ;  Surgeon: Luke Salaam, MD;  Location: [facility] ENDOSCOPY;  Service:               Endoscopy;  Laterality: N/A; 08/03/2020: COLONOSCOPY WITH PROPOFOL ; N/A     Comment:  Procedure: COLONOSCOPY WITH PROPOFOL ;  Surgeon:  Luke Salaam, MD;  Location: [facility] ENDOSCOPY;  Service:               Gastroenterology;  Laterality: N/A; No date: OOPHORECTOMY No date: SPINE SURGERY 2017: TOTAL HIP ARTHROPLASTY  BMI    Body Mass Index: 33.95 kg/m      Reproductive/Obstetrics negative OB ROS                             Anesthesia Physical Anesthesia Plan  ASA: 3  Anesthesia Plan: General   Post-op Pain Management:    Induction: Intravenous  PONV Risk Score and Plan: 3 and Propofol  infusion and TIVA  Airway Management Planned: Natural Airway and Nasal Cannula  Additional Equipment:   Intra-op Plan:   Post-operative Plan:   Informed Consent: I have reviewed the patients History and Physical, chart, labs and discussed the procedure including the risks, benefits and alternatives for the proposed anesthesia with the patient or authorized representative who has indicated his/her understanding and acceptance.     Dental Advisory Given  Plan Discussed with: Anesthesiologist, CRNA and Surgeon  Anesthesia Plan Comments: (Patient consented for risks of anesthesia including but not limited to:  - adverse reactions to medications - risk of airway placement if required - damage to eyes, teeth, lips or other oral mucosa - nerve damage due to positioning  - sore throat or hoarseness - Damage to heart, brain, nerves, lungs, other parts of body or  loss of life  Patient voiced understanding and assent.)       Anesthesia Quick Evaluation

## 2024-05-17 NOTE — Transfer of Care (Signed)
 Immediate Anesthesia Transfer of Care Note  Patient: Brittany Sherman  Procedure(s) Performed: COLONOSCOPY POLYPECTOMY, INTESTINE  Patient Location: PACU and Endoscopy Unit  Anesthesia Type:General  Level of Consciousness: drowsy  Airway & Oxygen Therapy: Patient Spontanous Breathing  Post-op Assessment: Report given to RN  Post vital signs: Reviewed and stable  Last Vitals:  Vitals Value Taken Time  BP 135/60 05/17/24 0839  Temp 35.9 C 05/17/24 0839  Pulse 62 05/17/24 0839  Resp 15 05/17/24 0839  SpO2 95 % 05/17/24 0839    Last Pain:  Vitals:   05/17/24 0839  TempSrc: Temporal  PainSc: Asleep         Complications: No notable events documented.

## 2024-05-17 NOTE — Care Plan (Signed)
 Brief GI Post op note See op report for further details  Poor prep throughout, but all stool was brown in nature. One polyp appearing area removed with forceps (not source of bleeding). Pt with internal hemorrhoids which were source of her BRBPR.  Should bleeding recur, recommend bid anusol suppositories for 10 days. Continue supplemental fiber and adequate water intake. Resume diet as tolerated. OK for dispo from GI standpoint.  GI to sign off. Available as needed. Please do not hesitate to call regarding questions or concerns.  Jorje Newton, DO [facility] Gastroenterology

## 2024-05-19 ENCOUNTER — Encounter: Payer: Self-pay | Admitting: Gastroenterology

## 2024-05-20 LAB — SURGICAL PATHOLOGY

## 2024-08-08 ENCOUNTER — Other Ambulatory Visit: Payer: Self-pay | Admitting: Cardiovascular Disease

## 2024-08-12 NOTE — Telephone Encounter (Signed)
 Pt said she don't an appointment and she don't take these medications

## 2024-08-12 NOTE — Telephone Encounter (Signed)
 Please contact pt for future appointment. Pt due for 12 month f/u.

## 2024-10-23 ENCOUNTER — Other Ambulatory Visit: Payer: Self-pay | Admitting: Family Medicine

## 2024-10-23 ENCOUNTER — Encounter: Payer: Self-pay | Admitting: Family Medicine

## 2024-10-23 DIAGNOSIS — Z1231 Encounter for screening mammogram for malignant neoplasm of breast: Secondary | ICD-10-CM

## 2024-10-31 ENCOUNTER — Ambulatory Visit
Admission: RE | Admit: 2024-10-31 | Discharge: 2024-10-31 | Disposition: A | Discharge status: Home / Self Care | Source: Ambulatory Visit | Attending: Family Medicine | Admitting: Family Medicine

## 2024-10-31 DIAGNOSIS — Z1231 Encounter for screening mammogram for malignant neoplasm of breast: Secondary | ICD-10-CM | POA: Diagnosis present

## 2024-11-28 ENCOUNTER — Emergency Department: Admission: EM | Admit: 2024-11-28 | Discharge: 2024-11-28 | Disposition: A | Discharge status: Home / Self Care

## 2024-11-28 ENCOUNTER — Emergency Department

## 2024-11-28 ENCOUNTER — Other Ambulatory Visit: Payer: Self-pay

## 2024-11-28 ENCOUNTER — Encounter: Payer: Self-pay | Admitting: Emergency Medicine

## 2024-11-28 DIAGNOSIS — Y92009 Unspecified place in unspecified non-institutional (private) residence as the place of occurrence of the external cause: Secondary | ICD-10-CM | POA: Insufficient documentation

## 2024-11-28 DIAGNOSIS — W19XXXA Unspecified fall, initial encounter: Secondary | ICD-10-CM

## 2024-11-28 DIAGNOSIS — W010XXA Fall on same level from slipping, tripping and stumbling without subsequent striking against object, initial encounter: Secondary | ICD-10-CM | POA: Insufficient documentation

## 2024-11-28 DIAGNOSIS — E119 Type 2 diabetes mellitus without complications: Secondary | ICD-10-CM | POA: Insufficient documentation

## 2024-11-28 DIAGNOSIS — S46912A Strain of unspecified muscle, fascia and tendon at shoulder and upper arm level, left arm, initial encounter: Secondary | ICD-10-CM | POA: Insufficient documentation

## 2024-11-28 DIAGNOSIS — I10 Essential (primary) hypertension: Secondary | ICD-10-CM | POA: Insufficient documentation

## 2024-11-28 DIAGNOSIS — S39012A Strain of muscle, fascia and tendon of lower back, initial encounter: Secondary | ICD-10-CM | POA: Insufficient documentation

## 2024-11-28 MED ORDER — OXYCODONE HCL 5 MG PO TABS: 5.0000 mg | ORAL_TABLET | Freq: Three times a day (TID) | ORAL | 0 refills | Status: AC | PRN

## 2024-11-28 MED ORDER — DIAZEPAM 2 MG PO TABS: 2.0000 mg | ORAL_TABLET | Freq: Two times a day (BID) | ORAL | 0 refills | Status: AC | PRN

## 2024-11-28 NOTE — Discharge Instructions (Signed)
 Follow-up with your regular doctor if not improving in 3 to 4 days.  Return for worsening. Tried the Valium and Tylenol  prior to using any of the oxycodone. The oxycodone may cause drowsiness and constipation.  If you can tolerate the discomfort without using this medication it would be best. Return to emergency department for worsening

## 2024-11-28 NOTE — ED Triage Notes (Signed)
 Fall Sunday evening. C/O low back pain

## 2024-11-28 NOTE — ED Provider Notes (Signed)
 [facility] Provider Note    Event Date/Time   First MD Initiated Contact with Patient 11/28/24 1321     (approximate)   History   Fall   HPI  Brittany Sherman is a 78 y.o. female history of hypertension, diabetes, CVA presents emergency department after a fall at home.  Patient states on Sunday, 4 to 5 days ago, she fell at home.  States she was on the last step when she slipped and landed on her bottom.  Complaining of low back pain.  No head injury, no loss consciousness, no headache.  States also her left shoulder is sore where she tried to catch herself.  States she was fine that day but has gotten more sore and had more pain in the following days.  Denies numbness or tingling.  No slurred speech etc.      Physical Exam   Triage Vital Signs: ED Triage Vitals  Encounter Vitals Group     BP 11/28/24 1257 (!) 143/71     Girls Systolic BP Percentile --      Girls Diastolic BP Percentile --      Boys Systolic BP Percentile --      Boys Diastolic BP Percentile --      Pulse Rate 11/28/24 1257 74     Resp 11/28/24 1257 17     Temp 11/28/24 1257 97.9 F (36.6 C)     Temp Source 11/28/24 1257 Oral     SpO2 11/28/24 1257 100 %     Weight 11/28/24 1257 171 lb 15.3 oz (78 kg)     Height 11/28/24 1311 5' 5 (1.651 m)     Head Circumference --      Peak Flow --      Pain Score 11/28/24 1257 10     Pain Loc --      Pain Education --      Exclude from Growth Chart --     Most recent vital signs: Vitals:   11/28/24 1257 11/28/24 1310  BP: (!) 143/71   Pulse: 74   Resp: 17   Temp: 97.9 F (36.6 C)   SpO2: 100% 100%     General: Awake, no distress.   CV:  Good peripheral perfusion.  Resp:  Normal effort.  Abd:  No distention.   Other:  Lumbar spine slightly tender to palpation, left shoulder slightly tender to palpation, full range of motion of the back and the left shoulder, neurovascular intact, cranial nerves II through XII grossly  intact   ED Results / Procedures / Treatments   Labs (all labs ordered are listed, but only abnormal results are displayed) Labs Reviewed - No data to display   EKG     RADIOLOGY X-ray lumbar spine and left shoulder    PROCEDURES:   Procedures  Critical Care:  no Chief Complaint  Patient presents with   Fall      MEDICATIONS ORDERED IN ED: Medications - No data to display   IMPRESSION / MDM / ASSESSMENT AND PLAN / ED COURSE  I reviewed the triage vital signs and the nursing notes.                              Differential diagnosis includes, but is not limited to, fall, contusion, sprain, fracture  Patient's presentation is most consistent with acute illness / injury with system symptoms.   X-ray left shoulder, lumbar spine  ordered  X-ray left shoulder, lumbar spine independent review interpretation by me as being negative for any acute abnormality  I did explain findings to patient.  Gave her prescription for Valium as a muscle relaxer.  Oxycodone if severe pain.  Explained to her to not use both of them at the same time.  Explained to her that the Valium could make her drowsy and the oxycodone also could make her drowsy and constipated.  She is to use Tylenol  with these medications.  Due to her age and does not want to put her on a oral anti-inflammatory due to concerns of possible GI bleed.  She is in agreement treatment plan.  Discharged stable condition.      FINAL CLINICAL IMPRESSION(S) / ED DIAGNOSES   Final diagnoses:  Fall, initial encounter  Strain of lumbar region, initial encounter  Strain of left shoulder, initial encounter     Rx / DC Orders   ED Discharge Orders          Ordered    diazepam (VALIUM) 2 MG tablet  Every 12 hours PRN        11/28/24 1457    oxyCODONE (ROXICODONE) 5 MG immediate release tablet  Every 8 hours PRN        11/28/24 1457             Note:  This document was prepared using Dragon voice recognition  software and may include unintentional dictation errors.    Gasper Devere ORN, PA-C 11/28/24 1611    Clarine Ozell LABOR, MD 11/28/24 [phone]

## 2024-12-22 NOTE — Progress Notes (Signed)
 "  Cardiology Office Note    Date:  12/23/2024   ID:  Brittany Sherman, Brittany Sherman [birth year], MRN [id number]  PCP:  Buren Rock HERO, MD  Cardiologist:  None  Electrophysiologist:  None   Chief Complaint: Follow-up  History of Present Illness:   Brittany Sherman is a 79 y.o. female with history of prior stroke, hypertension, type 2 diabetes, and aortic atherosclerosis who presents for follow-up on aortic atherosclerosis.    Patient was initially referred to Dr. Gollan in 06/2021 after incidental finding of aortic atherosclerosis on CT.  No significant coronary calcifications were noted.  She has been managed with statin therapy.    Patient was most recently seen in the cardiology clinic 07/2023 overall doing well from a cardiac perspective.  She was seen for preoperative risk assessment prior to colonoscopy without further testing needed.  There was some confusion surrounding her medication list.  She reported not taking several of her medications that morning.  No changes were made at that time.  Patient presents today overall doing well from a cardiac perspective.  She is fairly sedentary during the day watching TV in her house.  She still drives and runs errands by herself and is able to do household chores.  She denies exertional dyspnea and chest pain.  No lightheadedness, dizziness, palpitations, or lower extremity swelling.  Labs independently reviewed: 04/2024-Hgb 11.8, HCT 35.7, platelets 208, Sodium 136, potassium 4.2, BUN 30, creatinine 1.13, normal LFTs, A1c 12.7 01/2023-TC 232, TG 206, HDL 45, LDL 150  Objective   Past Medical History:  Diagnosis Date   Arthritis    Benign neoplasm of rectum and anal canal    Diabetes mellitus without complication (HCC)    History of vertigo    Hypertension    Immune deficiency disorder    Infectious colitis, enteritis and gastroenteritis    Partial epilepsy with impairment of consciousness (HCC)    Seizures (HCC)    Stroke (HCC)    Trigger  finger of right thumb    Vitamin D  deficiency     Current Medications: Active Medications[1]  Allergies:   Aspirin, Metformin  and related, and Penicillins   Social History   Socioeconomic History   Marital status: Widowed    Spouse name: Not on file   Number of children: Not on file   Years of education: Not on file   Highest education level: 8th grade  Occupational History   Not on file  Tobacco Use   Smoking status: Former    Current packs/day: 0.00    Average packs/day: 1.5 packs/day for 43.0 years (64.5 ttl pk-yrs)    Types: Cigarettes    Start date: 12/19/1963    Quit date: 12/18/2006    Years since quitting: 18.0   Smokeless tobacco: Never  Vaping Use   Vaping status: Never Used  Substance and Sexual Activity   Alcohol use: No   Drug use: No   Sexual activity: Never  Other Topics Concern   Not on file  Social History Narrative   Not on file   Social Drivers of Health   Tobacco Use: Medium Risk (12/23/2024)   Patient History    Smoking Tobacco Use: Former    Smokeless Tobacco Use: Never    Passive Exposure: Not on Actuary Strain: Not on file  Food Insecurity: Not on file  Transportation Needs: Not on file  Physical Activity: Not on file  Stress: Not on file  Social Connections: Not on file  Depression (PHQ2-9): Not on file  Alcohol Screen: Not on file  Housing: Unknown (07/31/2024)   Received from [facility]   Epic    Unable to Pay for Housing in the Last Year: Not on file    Number of Times Moved in the Last Year: Not on file    At any time in the past 12 months, were you homeless or living in a shelter (including now)?: No  Utilities: Not on file  Health Literacy: Not on file     Family History:  The patient's family history includes Aneurysm in her sister; Brain cancer in her mother; Cancer in her sister; Esophageal cancer in her sister. There is no history of Breast cancer.  ROS:   12-point review of systems is  negative unless otherwise noted in the HPI.  EKGs/Other Studies Reviewed:    EKG:  EKG personally reviewed by me today EKG Interpretation Date/Time:  Tuesday December 23 2024 10:40:10 EST Ventricular Rate:  70 PR Interval:  272 QRS Duration:  100 QT Interval:  410 QTC Calculation: 442 R Axis:   -28  Text Interpretation: Sinus rhythm with 1st degree A-V block When compared with ECG of 21-Nov-2023 14:27, QRS axis Shifted left Confirmed by Lorene Sinclair ([id number]) on 12/23/2024 10:42:56 AM  PHYSICAL EXAM:    VS:  BP 130/60 (BP Location: Left Arm, Patient Position: Sitting, Cuff Size: Normal)   Pulse 70 Comment: 70oximeter  Ht 5' 5 (1.651 m)   Wt 178 lb 6.4 oz (80.9 kg)   SpO2 98%   BMI 29.69 kg/m   BMI: Body mass index is 29.69 kg/m.  GEN: Well nourished, well developed in no acute distress NECK: No JVD; No carotid bruits CARDIAC: RRR, no murmurs, rubs, gallops RESPIRATORY:  Clear to auscultation without rales, wheezing or rhonchi  ABDOMEN: Soft, non-tender, non-distended EXTREMITIES: No edema; No deformity  Wt Readings from Last 3 Encounters:  12/23/24 178 lb 6.4 oz (80.9 kg)  11/28/24 171 lb 15.3 oz (78 kg)  05/15/24 172 lb (78 kg)                  ASSESSMENT & PLAN:   Aortic atherosclerosis Hyperlipidemia - Most recent lipid panel 01/2023 with LDL 150.  She has upcoming appointment with PCP later this week during which she will have routine labs.  Continue ezetimibe  10 mg daily and rosuvastatin  5 mg daily.  Hypertension - BP reasonably well-controlled on amlodipine  5 mg daily and lisinopril-HCTZ 20-25 mg daily.  T2DM - A1c of 12.7 in 04/2024.  Ongoing management by PCP.    Disposition: F/u with Dr. Gollan or an APP in 1 year.   Medication Adjustments/Labs and Tests Ordered: Current medicines are reviewed at length with the patient today.  Concerns regarding medicines are outlined above. Medication changes, Labs and Tests ordered today are summarized above and  listed in the Patient Instructions accessible in Encounters.   Bonney Sinclair Lorene, PA-C 12/23/2024 10:57 AM     [facility] [place] [address] [place], [place] [address] [phone]      [1]  Current Meds  Medication Sig   amLODipine  (NORVASC ) 5 MG tablet Take 1 tablet (5 mg total) by mouth daily. For further refills needs to see provider.   CALCIUM  600+D 600-10 MG-MCG TABS Take 1 tablet by mouth 2 (two) times daily.   divalproex  (DEPAKOTE ) 250 MG DR tablet Take 250 mg by mouth 2 (two) times daily.   ezetimibe  (  ZETIA ) 10 MG tablet Take 10 mg by mouth daily.   Insulin  Pen Needle (PEN NEEDLES) 32G X 6 MM MISC 1 each by Does not apply route daily.   lamoTRIgine  (LAMICTAL ) 150 MG tablet Take 150 mg by mouth daily.   LANTUS  SOLOSTAR 100 UNIT/ML Solostar Pen Measure fasting blood sugar every day: goal for now is to get this 120-150. Start Lantus   at 20 units sq at bedtime. Increase daily Insulin  dose by 3 units at a time, twice per week, until fasting sugars are consistently 120-150, then continue at that dose.   lisinopril-hydrochlorothiazide  (ZESTORETIC) 20-25 MG tablet Take 1 tablet by mouth daily.   magnesium oxide (MAG-OX) 400 MG tablet Take 1 tablet by mouth 2 (two) times daily.   Omega-3 Fatty Acids (FISH OIL) 1000 MG CAPS Take by mouth in the morning and at bedtime.   rosuvastatin  (CRESTOR ) 5 MG tablet Take 5 mg by mouth daily.   "

## 2024-12-23 ENCOUNTER — Encounter: Payer: Self-pay | Admitting: Physician Assistant

## 2024-12-23 ENCOUNTER — Ambulatory Visit: Attending: Physician Assistant | Admitting: Physician Assistant

## 2024-12-23 VITALS — BP 130/60 | HR 70 | Ht 65.0 in | Wt 178.4 lb

## 2024-12-23 DIAGNOSIS — E118 Type 2 diabetes mellitus with unspecified complications: Secondary | ICD-10-CM

## 2024-12-23 DIAGNOSIS — I10 Essential (primary) hypertension: Secondary | ICD-10-CM

## 2024-12-23 DIAGNOSIS — E1169 Type 2 diabetes mellitus with other specified complication: Secondary | ICD-10-CM

## 2024-12-23 DIAGNOSIS — E785 Hyperlipidemia, unspecified: Secondary | ICD-10-CM | POA: Diagnosis not present

## 2024-12-23 DIAGNOSIS — I700 Atherosclerosis of aorta: Secondary | ICD-10-CM | POA: Diagnosis not present

## 2024-12-23 NOTE — Patient Instructions (Signed)
 Medication Instructions:  Your physician recommends that you continue on your current medications as directed. Please refer to the Current Medication list given to you today.  *If you need a refill on your cardiac medications before your next appointment, please call your pharmacy*  Lab Work: No labs ordered today  If you have labs (blood work) drawn today and your tests are completely normal, you will receive your results only by: MyChart Message (if you have MyChart) OR A paper copy in the mail If you have any lab test that is abnormal or we need to change your treatment, we will call you to review the results.  Testing/Procedures: No test ordered today   Follow-Up: At [facility], you and your health needs are our priority.  As part of our continuing mission to provide you with exceptional heart care, our providers are all part of one team.  This team includes your primary Cardiologist (physician) and Advanced Practice Providers or APPs (Physician Assistants and Nurse Practitioners) who all work together to provide you with the care you need, when you need it.  Your next appointment:   1 year(s)  Provider:   You may see one of the following Advanced Practice Providers on your designated Care Team:   Lonni Meager, NP Lesley Maffucci, PA-C Bernardino Bring, PA-C Cadence Jackson, PA-C Tylene Lunch, NP Barnie Hila, NP    We recommend signing up for the patient portal called MyChart.  Sign up information is provided on this After Visit Summary.  MyChart is used to connect with patients for Virtual Visits (Telemedicine).  Patients are able to view lab/test results, encounter notes, upcoming appointments, etc.  Non-urgent messages can be sent to your provider as well.   To learn more about what you can do with MyChart, go to [email or web address].
